# Patient Record
Sex: Male | Born: 1955 | Race: White | Hispanic: No | Smoking: Former smoker
Health system: Southern US, Community
[De-identification: ages and names within clinical notes are randomized; demographics above are authoritative.]

## PROBLEM LIST (undated history)

## (undated) DIAGNOSIS — E669 Obesity, unspecified: Secondary | ICD-10-CM

## (undated) DIAGNOSIS — R200 Anesthesia of skin: Secondary | ICD-10-CM

## (undated) DIAGNOSIS — J449 Chronic obstructive pulmonary disease, unspecified: Secondary | ICD-10-CM

## (undated) DIAGNOSIS — M1990 Unspecified osteoarthritis, unspecified site: Secondary | ICD-10-CM

## (undated) DIAGNOSIS — E119 Type 2 diabetes mellitus without complications: Secondary | ICD-10-CM

## (undated) DIAGNOSIS — I10 Essential (primary) hypertension: Secondary | ICD-10-CM

## (undated) DIAGNOSIS — D649 Anemia, unspecified: Secondary | ICD-10-CM

## (undated) DIAGNOSIS — D126 Benign neoplasm of colon, unspecified: Secondary | ICD-10-CM

## (undated) DIAGNOSIS — E559 Vitamin D deficiency, unspecified: Secondary | ICD-10-CM

## (undated) DIAGNOSIS — E291 Testicular hypofunction: Secondary | ICD-10-CM

## (undated) DIAGNOSIS — E781 Pure hyperglyceridemia: Secondary | ICD-10-CM

## (undated) DIAGNOSIS — E785 Hyperlipidemia, unspecified: Secondary | ICD-10-CM

## (undated) HISTORY — PX: COLONOSCOPY: SHX174

## (undated) HISTORY — PX: ORIF FINGER / THUMB FRACTURE: SUR932

---

## 2007-08-27 ENCOUNTER — Ambulatory Visit: Payer: Self-pay | Admitting: Unknown Physician Specialty

## 2009-02-15 ENCOUNTER — Ambulatory Visit: Payer: Self-pay | Admitting: Family Medicine

## 2011-01-08 ENCOUNTER — Ambulatory Visit: Payer: Self-pay | Admitting: Unknown Physician Specialty

## 2011-01-09 LAB — PATHOLOGY REPORT

## 2011-04-09 ENCOUNTER — Emergency Department: Payer: Self-pay | Admitting: Emergency Medicine

## 2011-04-13 ENCOUNTER — Ambulatory Visit: Payer: Self-pay | Admitting: Family Medicine

## 2011-06-16 ENCOUNTER — Inpatient Hospital Stay: Payer: Self-pay | Admitting: Specialist

## 2014-08-26 DIAGNOSIS — R600 Localized edema: Secondary | ICD-10-CM | POA: Insufficient documentation

## 2015-03-01 DIAGNOSIS — E559 Vitamin D deficiency, unspecified: Secondary | ICD-10-CM | POA: Insufficient documentation

## 2015-05-16 ENCOUNTER — Ambulatory Visit: Payer: 59 | Admitting: Anesthesiology

## 2015-05-16 ENCOUNTER — Encounter: Payer: Self-pay | Admitting: *Deleted

## 2015-05-16 ENCOUNTER — Ambulatory Visit
Admission: RE | Admit: 2015-05-16 | Discharge: 2015-05-16 | Disposition: A | Discharge status: Home / Self Care | Payer: 59 | Source: Ambulatory Visit | Attending: Unknown Physician Specialty | Admitting: Unknown Physician Specialty

## 2015-05-16 ENCOUNTER — Encounter
Admission: RE | Disposition: A | Discharge status: Home / Self Care | Payer: Self-pay | Source: Ambulatory Visit | Attending: Unknown Physician Specialty

## 2015-05-16 DIAGNOSIS — Z79899 Other long term (current) drug therapy: Secondary | ICD-10-CM | POA: Insufficient documentation

## 2015-05-16 DIAGNOSIS — Z87891 Personal history of nicotine dependence: Secondary | ICD-10-CM | POA: Insufficient documentation

## 2015-05-16 DIAGNOSIS — I10 Essential (primary) hypertension: Secondary | ICD-10-CM | POA: Insufficient documentation

## 2015-05-16 DIAGNOSIS — Z86010 Personal history of colonic polyps: Secondary | ICD-10-CM | POA: Diagnosis not present

## 2015-05-16 DIAGNOSIS — K5730 Diverticulosis of large intestine without perforation or abscess without bleeding: Secondary | ICD-10-CM | POA: Diagnosis not present

## 2015-05-16 DIAGNOSIS — Z792 Long term (current) use of antibiotics: Secondary | ICD-10-CM | POA: Diagnosis not present

## 2015-05-16 DIAGNOSIS — D649 Anemia, unspecified: Secondary | ICD-10-CM | POA: Insufficient documentation

## 2015-05-16 DIAGNOSIS — E119 Type 2 diabetes mellitus without complications: Secondary | ICD-10-CM | POA: Insufficient documentation

## 2015-05-16 DIAGNOSIS — Z1211 Encounter for screening for malignant neoplasm of colon: Secondary | ICD-10-CM | POA: Diagnosis present

## 2015-05-16 DIAGNOSIS — Z6841 Body Mass Index (BMI) 40.0 and over, adult: Secondary | ICD-10-CM | POA: Insufficient documentation

## 2015-05-16 DIAGNOSIS — J449 Chronic obstructive pulmonary disease, unspecified: Secondary | ICD-10-CM | POA: Diagnosis not present

## 2015-05-16 DIAGNOSIS — Z7982 Long term (current) use of aspirin: Secondary | ICD-10-CM | POA: Diagnosis not present

## 2015-05-16 DIAGNOSIS — K648 Other hemorrhoids: Secondary | ICD-10-CM | POA: Insufficient documentation

## 2015-05-16 DIAGNOSIS — K621 Rectal polyp: Secondary | ICD-10-CM | POA: Diagnosis not present

## 2015-05-16 DIAGNOSIS — E785 Hyperlipidemia, unspecified: Secondary | ICD-10-CM | POA: Diagnosis not present

## 2015-05-16 DIAGNOSIS — D123 Benign neoplasm of transverse colon: Secondary | ICD-10-CM | POA: Diagnosis not present

## 2015-05-16 DIAGNOSIS — E781 Pure hyperglyceridemia: Secondary | ICD-10-CM | POA: Diagnosis not present

## 2015-05-16 HISTORY — DX: Anemia, unspecified: D64.9

## 2015-05-16 HISTORY — DX: Type 2 diabetes mellitus without complications: E11.9

## 2015-05-16 HISTORY — DX: Hyperlipidemia, unspecified: E78.5

## 2015-05-16 HISTORY — DX: Benign neoplasm of colon, unspecified: D12.6

## 2015-05-16 HISTORY — DX: Testicular hypofunction: E29.1

## 2015-05-16 HISTORY — DX: Vitamin D deficiency, unspecified: E55.9

## 2015-05-16 HISTORY — PX: COLONOSCOPY WITH PROPOFOL: SHX5780

## 2015-05-16 HISTORY — DX: Obesity, unspecified: E66.9

## 2015-05-16 HISTORY — DX: Pure hyperglyceridemia: E78.1

## 2015-05-16 HISTORY — DX: Essential (primary) hypertension: I10

## 2015-05-16 LAB — GLUCOSE, CAPILLARY: Glucose-Capillary: 283 mg/dL — ABNORMAL HIGH (ref 65–99)

## 2015-05-16 SURGERY — COLONOSCOPY WITH PROPOFOL
Anesthesia: General

## 2015-05-16 MED ORDER — SODIUM CHLORIDE 0.9 % IV SOLN: INTRAVENOUS | Status: DC

## 2015-05-16 MED ORDER — PROPOFOL INFUSION 10 MG/ML OPTIME
INTRAVENOUS | Status: DC | PRN
  Administered 2015-05-16: 140 ug/kg/min via INTRAVENOUS

## 2015-05-16 MED ORDER — ATENOLOL 25 MG PO TABS
50.0000 mg | ORAL_TABLET | Freq: Once | ORAL | Status: AC
  Administered 2015-05-16: 50 mg via ORAL

## 2015-05-16 MED ORDER — ATENOLOL 25 MG PO TABS
ORAL_TABLET | ORAL | Status: AC
Start: 2015-05-16 — End: 2015-05-16
  Administered 2015-05-16: 50 mg via ORAL
  Filled 2015-05-16: qty 2

## 2015-05-16 MED ORDER — SODIUM CHLORIDE 0.9 % IV SOLN
INTRAVENOUS | Status: DC
  Administered 2015-05-16: 10:00:00 via INTRAVENOUS

## 2015-05-16 MED ORDER — FENTANYL CITRATE (PF) 100 MCG/2ML IJ SOLN
INTRAMUSCULAR | Status: DC | PRN
  Administered 2015-05-16: 50 ug via INTRAVENOUS

## 2015-05-16 MED ORDER — MIDAZOLAM HCL 2 MG/2ML IJ SOLN
INTRAMUSCULAR | Status: DC | PRN
  Administered 2015-05-16: 1 mg via INTRAVENOUS

## 2015-05-16 NOTE — Op Note (Signed)
[facility] Patient Name: Andrew Dominguez Procedure Date: 05/16/2015 9:52 AM MRN: [id number] Account #: [id number] Date of Birth: 1956-04-10 Admit Type: Outpatient Age: 59 Room: [facility] ENDO ROOM 1 Gender: Male Note Status: Finalized Procedure:         Colonoscopy Indications:       Follow-up for history of adenomatous polyps in the colon Providers:         Manya Silvas, MD Referring MD:      Youlanda Roys. Ola Spurr, MD (Referring MD) Medicines:         Propofol per Anesthesia Complications:     No immediate complications. Procedure:         Pre-Anesthesia Assessment:                    - After reviewing the risks and benefits, the patient was                     deemed in satisfactory condition to undergo the procedure.                    After obtaining informed consent, the colonoscope was                     passed under direct vision. Throughout the procedure, the                     patient's blood pressure, pulse, and oxygen saturations                     were monitored continuously. The Colonoscope was                     introduced through the anus and advanced to the the cecum,                     identified by its appearance. The colonoscopy was                     performed without difficulty. The patient tolerated the                     procedure well. The quality of the bowel preparation was                     good. Findings:      Could see part of the cecum but due to body habitus could not place       scope in cecum.      A few small-mouthed diverticula were found in the sigmoid colon.      Internal hemorrhoids were found during endoscopy. The hemorrhoids were       small and Grade I (internal hemorrhoids that do not prolapse).      Two sessile polyps were found in the sigmoid colon and at the hepatic       flexure. The polyps were small in size. These polyps were removed with a       hot snare. Resection and retrieval were  complete.      Two sessile polyps were found in the rectum. The polyps were diminutive       in size. These polyps were removed with a jumbo cold forceps. Resection       and retrieval were complete.      The exam was otherwise without abnormality.  Impression:        - Diverticulosis in the sigmoid colon.                    - Internal hemorrhoids.                    - Two small polyps in the sigmoid colon and at the hepatic                     flexure. Resected and retrieved.                    - Two diminutive polyps in the rectum. Resected and                     retrieved.                    - The examination was otherwise normal. Recommendation:    - Await pathology results.                    - The findings and recommendations were discussed with the                     patient's family. Manya Silvas, MD 05/16/2015 10:23:19 AM This report has been signed electronically. Number of Addenda: 0 Note Initiated On: 05/16/2015 9:52 AM Scope Withdrawal Time: 0 hours 9 minutes 36 seconds  Total Procedure Duration: 0 hours 19 minutes 35 seconds       [facility]

## 2015-05-16 NOTE — H&P (Signed)
Primary Care Physician:  Adrian Prows, MD Primary Gastroenterologist:  Dr. Vira Agar  Pre-Procedure History & Physical: HPI:  Andrew Dominguez is a 59 y.o. male is here for an colonoscopy.   Past Medical History  Diagnosis Date  . Anemia   . Hypertension   . Hyperlipemia   . Obesity   . Vitamin D deficiency   . Hypertriglyceridemia   . Hypogonadism in male   . Tubular adenoma of colon   . Diabetes mellitus without complication     Past Surgical History  Procedure Laterality Date  . Colonoscopy      Prior to Admission medications   Medication Sig Start Date End Date Taking? Authorizing Provider  amLODipine (NORVASC) 10 MG tablet Take 10 mg by mouth daily.   Yes Historical Provider, MD  aspirin EC 81 MG tablet Take 81 mg by mouth daily.   Yes Historical Provider, MD  atenolol (TENORMIN) 50 MG tablet Take 50 mg by mouth daily.   Yes Historical Provider, MD  cephALEXin (KEFLEX) 500 MG capsule Take 500 mg by mouth 4 (four) times daily.   Yes Historical Provider, MD  hydrochlorothiazide (HYDRODIURIL) 25 MG tablet Take 25 mg by mouth daily.   Yes Historical Provider, MD  losartan (COZAAR) 100 MG tablet Take 100 mg by mouth daily.   Yes Historical Provider, MD  metFORMIN (GLUCOPHAGE) 1000 MG tablet Take 1,000 mg by mouth 2 (two) times daily with a meal.   Yes Historical Provider, MD  niacin-lovastatin (ADVICOR) 500-20 MG 24 hr tablet Take 1 tablet by mouth at bedtime.   Yes Historical Provider, MD    Allergies as of 03/31/2015  . (Not on File)    History reviewed. No pertinent family history.  History   Social History  . Marital Status: Married    Spouse Name: N/A  . Number of Children: N/A  . Years of Education: N/A   Occupational History  . Not on file.   Social History Main Topics  . Smoking status: Former Research scientist (life sciences)  . Smokeless tobacco: Never Used  . Alcohol Use: 8.4 oz/week    14 Shots of liquor per week  . Drug Use: No  . Sexual Activity: Not on file    Other Topics Concern  . Not on file   Social History Narrative    Review of Systems: See HPI, otherwise negative ROS  Physical Exam: BP 143/84 mmHg  Pulse 90  Temp(Src) 97.4 F (36.3 C) (Tympanic)  Resp 16  Ht 6' (1.829 m)  Wt 151.955 kg (335 lb)  BMI 45.42 kg/m2  SpO2 97% General:   Alert,  pleasant and cooperative in NAD Head:  Normocephalic and atraumatic. Neck:  Supple; no masses or thyromegaly. Lungs:  Clear throughout to auscultation.    Heart:  Regular rate and rhythm. Abdomen:  Soft, nontender and nondistended. Normal bowel sounds, without guarding, and without rebound.   Neurologic:  Alert and  oriented x4;  grossly normal neurologically.  Impression/Plan: Andrew Dominguez is here for an colonoscopy to be performed for personal history of colon polyps  Risks, benefits, limitations, and alternatives regarding  colonoscopy have been reviewed with the patient.  Questions have been answered.  All parties agreeable.   Gaylyn Cheers, MD  05/16/2015, 9:47 AM   Primary Care Physician:  Adrian Prows, MD Primary Gastroenterologist:  Dr. Vira Agar  Pre-Procedure History & Physical: HPI:  Andrew Dominguez is a 59 y.o. male is here for an colonoscopy.   Past Medical History  Diagnosis Date  . Anemia   . Hypertension   . Hyperlipemia   . Obesity   . Vitamin D deficiency   . Hypertriglyceridemia   . Hypogonadism in male   . Tubular adenoma of colon   . Diabetes mellitus without complication     Past Surgical History  Procedure Laterality Date  . Colonoscopy      Prior to Admission medications   Medication Sig Start Date End Date Taking? Authorizing Provider  amLODipine (NORVASC) 10 MG tablet Take 10 mg by mouth daily.   Yes Historical Provider, MD  aspirin EC 81 MG tablet Take 81 mg by mouth daily.   Yes Historical Provider, MD  atenolol (TENORMIN) 50 MG tablet Take 50 mg by mouth daily.   Yes Historical Provider, MD  cephALEXin (KEFLEX) 500 MG capsule  Take 500 mg by mouth 4 (four) times daily.   Yes Historical Provider, MD  hydrochlorothiazide (HYDRODIURIL) 25 MG tablet Take 25 mg by mouth daily.   Yes Historical Provider, MD  losartan (COZAAR) 100 MG tablet Take 100 mg by mouth daily.   Yes Historical Provider, MD  metFORMIN (GLUCOPHAGE) 1000 MG tablet Take 1,000 mg by mouth 2 (two) times daily with a meal.   Yes Historical Provider, MD  niacin-lovastatin (ADVICOR) 500-20 MG 24 hr tablet Take 1 tablet by mouth at bedtime.   Yes Historical Provider, MD    Allergies as of 03/31/2015  . (Not on File)    History reviewed. No pertinent family history.  History   Social History  . Marital Status: Married    Spouse Name: N/A  . Number of Children: N/A  . Years of Education: N/A   Occupational History  . Not on file.   Social History Main Topics  . Smoking status: Former Research scientist (life sciences)  . Smokeless tobacco: Never Used  . Alcohol Use: 8.4 oz/week    14 Shots of liquor per week  . Drug Use: No  . Sexual Activity: Not on file   Other Topics Concern  . Not on file   Social History Narrative    Review of Systems: See HPI, otherwise negative ROS  Physical Exam: BP 143/84 mmHg  Pulse 90  Temp(Src) 97.4 F (36.3 C) (Tympanic)  Resp 16  Ht 6' (1.829 m)  Wt 151.955 kg (335 lb)  BMI 45.42 kg/m2  SpO2 97% General:   Alert,  pleasant and cooperative in NAD Head:  Normocephalic and atraumatic. Neck:  Supple; no masses or thyromegaly. Lungs:  Clear throughout to auscultation.    Heart:  Regular rate and rhythm. Abdomen:  Soft, nontender and nondistended. Normal bowel sounds, without guarding, and without rebound.  Very massive abdomen and obese Neurologic:  Alert and  oriented x4;  grossly normal neurologically.  Impression/Plan: Andrew Dominguez is here for an colonoscopy to be performed for personal history of colon polyps  Risks, benefits, limitations, and alternatives regarding  colonoscopy have been reviewed with the patient.   Questions have been answered.  All parties agreeable.   Gaylyn Cheers, MD  05/16/2015, 9:47 AM

## 2015-05-16 NOTE — Transfer of Care (Signed)
Immediate Anesthesia Transfer of Care Note  Patient: JAQUAWN SAFFRAN  Procedure(s) Performed: Procedure(s): COLONOSCOPY WITH PROPOFOL (N/A)  Patient Location: PACU  Anesthesia Type:General  Level of Consciousness: awake, alert  and oriented  Airway & Oxygen Therapy: Patient Spontanous Breathing and Patient connected to nasal cannula oxygen  Post-op Assessment: Report given to RN and Post -op Vital signs reviewed and stable  Post vital signs: Reviewed and stable  Last Vitals:  Filed Vitals:   05/16/15 0914  BP: 143/84  Pulse: 90  Temp: 36.3 C  Resp: 16    Complications: No apparent anesthesia complications

## 2015-05-16 NOTE — Anesthesia Procedure Notes (Signed)
Performed by: COOK-MARTIN, Charelle Petrakis Pre-anesthesia Checklist: Patient identified, Emergency Drugs available, Suction available, Patient being monitored and Timeout performed Patient Re-evaluated:Patient Re-evaluated prior to inductionOxygen Delivery Method: Simple face mask Preoxygenation: Pre-oxygenation with 100% oxygen Intubation Type: IV induction Placement Confirmation: positive ETCO2 and CO2 detector       

## 2015-05-16 NOTE — Anesthesia Preprocedure Evaluation (Addendum)
Anesthesia Evaluation  Patient identified by MRN, date of birth, ID band Patient awake    Reviewed: Allergy & Precautions, NPO status , Patient's Chart, lab work & pertinent test results  Airway Mallampati: III       Dental  (+) Poor Dentition, Chipped   Pulmonary COPDformer smoker,    + decreased breath sounds      Cardiovascular hypertension, Pt. on medications and Pt. on home beta blockers Normal cardiovascular exam    Neuro/Psych negative neurological ROS  negative psych ROS   GI/Hepatic negative GI ROS, Neg liver ROS,   Endo/Other  diabetes, Poorly Controlled, Type 2, Oral Hypoglycemic AgentsMorbid obesity  Renal/GU   negative genitourinary   Musculoskeletal   Abdominal (+) + obese,   Peds negative pediatric ROS (+)  Hematology  (+) anemia ,   Anesthesia Other Findings   Reproductive/Obstetrics                            Anesthesia Physical Anesthesia Plan  ASA: III  Anesthesia Plan: General   Post-op Pain Management:    Induction: Intravenous  Airway Management Planned: Natural Airway  Additional Equipment:   Intra-op Plan:   Post-operative Plan:   Informed Consent:   Plan Discussed with: CRNA  Anesthesia Plan Comments:         Anesthesia Quick Evaluation

## 2015-05-16 NOTE — Anesthesia Postprocedure Evaluation (Signed)
  Anesthesia Post-op Note  Patient: Andrew Dominguez  Procedure(s) Performed: Procedure(s): COLONOSCOPY WITH PROPOFOL (N/A)  Anesthesia type:General  Patient location: PACU  Post pain: Pain level controlled  Post assessment: Post-op Vital signs reviewed, Patient's Cardiovascular Status Stable, Respiratory Function Stable, Patent Airway and No signs of Nausea or vomiting  Post vital signs: Reviewed and stable  Last Vitals:  Filed Vitals:   05/16/15 0914  BP: 143/84  Pulse: 90  Temp: 36.3 C  Resp: 16    Level of consciousness: awake, alert  and patient cooperative  Complications: No apparent anesthesia complications

## 2015-05-16 NOTE — Anesthesia Postprocedure Evaluation (Signed)
  Anesthesia Post-op Note  Patient: Andrew Dominguez  Procedure(s) Performed: Procedure(s): COLONOSCOPY WITH PROPOFOL (N/A)  Anesthesia type:General  Patient location: PACU  Post pain: Pain level controlled  Post assessment: Post-op Vital signs reviewed, Patient's Cardiovascular Status Stable, Respiratory Function Stable, Patent Airway and No signs of Nausea or vomiting  Post vital signs: Reviewed and stable  Last Vitals:  Filed Vitals:   05/16/15 1030  BP: 122/66  Pulse: 65  Temp:   Resp: 13    Level of consciousness: awake, alert  and patient cooperative  Complications: No apparent anesthesia complications

## 2015-05-17 ENCOUNTER — Encounter: Payer: Self-pay | Admitting: Unknown Physician Specialty

## 2015-05-17 LAB — SURGICAL PATHOLOGY

## 2015-07-21 ENCOUNTER — Emergency Department
Admission: EM | Admit: 2015-07-21 | Discharge: 2015-07-21 | Disposition: A | Discharge status: Home / Self Care | Payer: 59 | Attending: Emergency Medicine | Admitting: Emergency Medicine

## 2015-07-21 ENCOUNTER — Emergency Department: Payer: 59

## 2015-07-21 DIAGNOSIS — Z87891 Personal history of nicotine dependence: Secondary | ICD-10-CM | POA: Insufficient documentation

## 2015-07-21 DIAGNOSIS — Z79899 Other long term (current) drug therapy: Secondary | ICD-10-CM | POA: Diagnosis not present

## 2015-07-21 DIAGNOSIS — Z792 Long term (current) use of antibiotics: Secondary | ICD-10-CM | POA: Diagnosis not present

## 2015-07-21 DIAGNOSIS — Z7982 Long term (current) use of aspirin: Secondary | ICD-10-CM | POA: Diagnosis not present

## 2015-07-21 DIAGNOSIS — D649 Anemia, unspecified: Secondary | ICD-10-CM | POA: Insufficient documentation

## 2015-07-21 DIAGNOSIS — M65272 Calcific tendinitis, left ankle and foot: Secondary | ICD-10-CM | POA: Insufficient documentation

## 2015-07-21 DIAGNOSIS — I10 Essential (primary) hypertension: Secondary | ICD-10-CM | POA: Insufficient documentation

## 2015-07-21 DIAGNOSIS — E785 Hyperlipidemia, unspecified: Secondary | ICD-10-CM | POA: Insufficient documentation

## 2015-07-21 DIAGNOSIS — E119 Type 2 diabetes mellitus without complications: Secondary | ICD-10-CM | POA: Insufficient documentation

## 2015-07-21 DIAGNOSIS — M79672 Pain in left foot: Secondary | ICD-10-CM | POA: Diagnosis present

## 2015-07-21 MED ORDER — NAPROXEN 500 MG PO TABS
500.0000 mg | ORAL_TABLET | Freq: Once | ORAL | Status: AC
  Administered 2015-07-21: 500 mg via ORAL
  Filled 2015-07-21: qty 1

## 2015-07-21 MED ORDER — TRAMADOL HCL 50 MG PO TABS: 50.0000 mg | ORAL_TABLET | Freq: Four times a day (QID) | ORAL | Status: DC | PRN

## 2015-07-21 MED ORDER — TRAMADOL HCL 50 MG PO TABS
50.0000 mg | ORAL_TABLET | Freq: Once | ORAL | Status: AC
  Administered 2015-07-21: 50 mg via ORAL
  Filled 2015-07-21: qty 1

## 2015-07-21 MED ORDER — NAPROXEN 500 MG PO TABS: 500.0000 mg | ORAL_TABLET | Freq: Two times a day (BID) | ORAL | Status: DC

## 2015-07-21 NOTE — ED Notes (Signed)
Pt to ED c/o pain to bottom of L foot, pt reports hearing a "pop" as pain began. Pt denies any increase from normal in swelling and denies skin breakdown.

## 2015-07-21 NOTE — ED Provider Notes (Signed)
[facility] Emergency Department Provider Note  ____________________________________________  Time seen: Approximately 9:03 PM  I have reviewed the triage vital signs and the nursing notes.   HISTORY  Chief Complaint Foot Pain    HPI Andrew Dominguez is a 59 y.o. male patient complaining of pain to the bottom of his foot patient's report. A popping sound on the onset of pain. Patient state he has not noticed any swelling or redness to the foot. Patient stated pain is worse when he's walking barefooted. Patient stated nonweightbearing has no pain. When ambulating pain as approximately a 5/10. No palliative measures taken for this complaint.   Past Medical History  Diagnosis Date  . Anemia   . Hypertension   . Hyperlipemia   . Obesity   . Vitamin D deficiency   . Hypertriglyceridemia   . Hypogonadism in male   . Tubular adenoma of colon   . Diabetes mellitus without complication     There are no active problems to display for this patient.   Past Surgical History  Procedure Laterality Date  . Colonoscopy    . Colonoscopy with propofol N/A 05/16/2015    Procedure: COLONOSCOPY WITH PROPOFOL;  Surgeon: Manya Silvas, MD;  Location: [facility] ENDOSCOPY;  Service: Endoscopy;  Laterality: N/A;    Current Outpatient Rx  Name  Route  Sig  Dispense  Refill  . amLODipine (NORVASC) 10 MG tablet   Oral   Take 10 mg by mouth daily.         Marland Kitchen aspirin EC 81 MG tablet   Oral   Take 81 mg by mouth daily.         Marland Kitchen atenolol (TENORMIN) 50 MG tablet   Oral   Take 50 mg by mouth daily.         . cephALEXin (KEFLEX) 500 MG capsule   Oral   Take 500 mg by mouth 4 (four) times daily.         . hydrochlorothiazide (HYDRODIURIL) 25 MG tablet   Oral   Take 25 mg by mouth daily.         Marland Kitchen losartan (COZAAR) 100 MG tablet   Oral   Take 100 mg by mouth daily.         . metFORMIN (GLUCOPHAGE) 1000 MG tablet   Oral   Take 1,000 mg by mouth 2 (two)  times daily with a meal.         . naproxen (NAPROSYN) 500 MG tablet   Oral   Take 1 tablet (500 mg total) by mouth 2 (two) times daily with a meal.   20 tablet   00   . niacin-lovastatin (ADVICOR) 500-20 MG 24 hr tablet   Oral   Take 1 tablet by mouth at bedtime.         . traMADol (ULTRAM) 50 MG tablet   Oral   Take 1 tablet (50 mg total) by mouth every 6 (six) hours as needed for moderate pain.   12 tablet   0     Allergies Sulfa antibiotics  No family history on file.  Social History Social History  Substance Use Topics  . Smoking status: Former Research scientist (life sciences)  . Smokeless tobacco: Never Used  . Alcohol Use: 8.4 oz/week    14 Shots of liquor per week    Review of Systems Constitutional: No fever/chills Eyes: No visual changes. ENT: No sore throat. Cardiovascular: Denies chest pain. Respiratory: Denies shortness of breath. Gastrointestinal: No abdominal pain.  No nausea, no vomiting.  No diarrhea.  No constipation. Genitourinary: Negative for dysuria. Musculoskeletal: Negative for back pain. Skin: Negative for rash. Neurological: Negative for headaches, focal weakness or numbness. Endocrine:Hypertension hyperlipidemia and diabetes. Hematological/Lymphatic:Anemia Allergic/Immunilogical: Sulfa medications ____________________________________________   PHYSICAL EXAM:  VITAL SIGNS: ED Triage Vitals  Enc Vitals Group     BP 07/21/15 1949 191/85 mmHg     Pulse Rate 07/21/15 1949 64     Resp 07/21/15 1949 20     Temp 07/21/15 1948 98.1 F (36.7 C)     Temp Source 07/21/15 1948 Oral     SpO2 07/21/15 1949 97 %     Weight 07/21/15 1949 318 lb (144.244 kg)     Height 07/21/15 1949 6' (1.829 m)     Head Cir --      Peak Flow --      Pain Score 07/21/15 1949 0     Pain Loc --      Pain Edu? --      Excl. in [place]? --     Constitutional: Alert and oriented. Well appearing and in no acute distress. Morbid obesity Eyes: Conjunctivae are normal. PERRL.  EOMI. Head: Atraumatic. Nose: No congestion/rhinnorhea. Mouth/Throat: Mucous membranes are moist.  Oropharynx non-erythematous. Neck: No stridor.   Hematological/Lymphatic/Immunilogical: No cervical lymphadenopathy. Cardiovascular: Normal rate, regular rhythm. Grossly normal heart sounds.  Good peripheral circulation. Elevated blood pressure Respiratory: Normal respiratory effort.  No retractions. Lungs CTAB. Gastrointestinal: Soft and nontender. No distention. No abdominal bruits. No CVA tenderness. Musculoskeletal: Patient has a very high arch. There is no obvious edema or erythema. Patient is tender palpation at the posterior heel also the plantar aspect of the heel. Neurologic:  Normal speech and language. No gross focal neurologic deficits are appreciated. No gait instability. Skin:  Skin is warm, dry and intact. No rash noted. Psychiatric: Mood and affect are normal. Speech and behavior are normal.  ____________________________________________   LABS (all labs ordered are listed, but only abnormal results are displayed)  Labs Reviewed - No data to display ____________________________________________  EKG   ____________________________________________  RADIOLOGY  No bony pathology patient has no Achilles spurs and a small calcaneus spur. I, Sable Feil, personally viewed and evaluated these images (plain radiographs) as part of my medical decision making.   ____________________________________________   PROCEDURES  Procedure(s) performed: None  Critical Care performed: No  ____________________________________________   INITIAL IMPRESSION / ASSESSMENT AND PLAN / ED COURSE  Pertinent labs & imaging results that were available during my care of the patient were reviewed by me and considered in my medical decision making (see chart for details).  Tendinitis left foot. Discuss x-ray findings with patient and either follow-up with podiatry. Patient given a  prescription for tramadol and naproxen. ____________________________________________   FINAL CLINICAL IMPRESSION(S) / ED DIAGNOSES  Final diagnoses:  Calcific tendonitis of foot, left      RAIFE LIZER, PA-C 07/21/15 2120  Nena Polio, MD 07/22/15 [phone]

## 2016-11-06 DIAGNOSIS — E119 Type 2 diabetes mellitus without complications: Secondary | ICD-10-CM | POA: Diagnosis not present

## 2016-11-06 DIAGNOSIS — E782 Mixed hyperlipidemia: Secondary | ICD-10-CM | POA: Diagnosis not present

## 2016-11-06 DIAGNOSIS — I10 Essential (primary) hypertension: Secondary | ICD-10-CM | POA: Diagnosis not present

## 2016-11-13 DIAGNOSIS — Z0000 Encounter for general adult medical examination without abnormal findings: Secondary | ICD-10-CM | POA: Diagnosis not present

## 2016-11-13 DIAGNOSIS — E1129 Type 2 diabetes mellitus with other diabetic kidney complication: Secondary | ICD-10-CM | POA: Diagnosis not present

## 2016-11-13 DIAGNOSIS — Z1159 Encounter for screening for other viral diseases: Secondary | ICD-10-CM | POA: Diagnosis not present

## 2016-12-11 DIAGNOSIS — E781 Pure hyperglyceridemia: Secondary | ICD-10-CM | POA: Diagnosis not present

## 2016-12-11 DIAGNOSIS — E538 Deficiency of other specified B group vitamins: Secondary | ICD-10-CM | POA: Diagnosis not present

## 2016-12-11 DIAGNOSIS — E1165 Type 2 diabetes mellitus with hyperglycemia: Secondary | ICD-10-CM | POA: Diagnosis not present

## 2016-12-18 DIAGNOSIS — E781 Pure hyperglyceridemia: Secondary | ICD-10-CM | POA: Diagnosis not present

## 2016-12-18 DIAGNOSIS — E538 Deficiency of other specified B group vitamins: Secondary | ICD-10-CM | POA: Diagnosis not present

## 2016-12-18 DIAGNOSIS — E1129 Type 2 diabetes mellitus with other diabetic kidney complication: Secondary | ICD-10-CM | POA: Diagnosis not present

## 2016-12-24 DIAGNOSIS — N492 Inflammatory disorders of scrotum: Secondary | ICD-10-CM | POA: Diagnosis not present

## 2016-12-28 DIAGNOSIS — N492 Inflammatory disorders of scrotum: Secondary | ICD-10-CM | POA: Diagnosis not present

## 2017-01-01 ENCOUNTER — Ambulatory Visit: Payer: Self-pay | Admitting: Urology

## 2017-01-01 ENCOUNTER — Encounter: Payer: Self-pay | Admitting: Urology

## 2017-01-01 VITALS — BP 155/90 | HR 73 | Ht 72.0 in | Wt 324.0 lb

## 2017-01-01 DIAGNOSIS — E785 Hyperlipidemia, unspecified: Secondary | ICD-10-CM | POA: Insufficient documentation

## 2017-01-01 DIAGNOSIS — N4889 Other specified disorders of penis: Secondary | ICD-10-CM

## 2017-01-01 DIAGNOSIS — N492 Inflammatory disorders of scrotum: Secondary | ICD-10-CM

## 2017-01-01 DIAGNOSIS — E669 Obesity, unspecified: Secondary | ICD-10-CM | POA: Insufficient documentation

## 2017-01-01 DIAGNOSIS — E781 Pure hyperglyceridemia: Secondary | ICD-10-CM | POA: Insufficient documentation

## 2017-01-01 DIAGNOSIS — I10 Essential (primary) hypertension: Secondary | ICD-10-CM | POA: Insufficient documentation

## 2017-01-01 DIAGNOSIS — E119 Type 2 diabetes mellitus without complications: Secondary | ICD-10-CM | POA: Insufficient documentation

## 2017-01-01 MED ORDER — AMOXICILLIN-POT CLAVULANATE 875-125 MG PO TABS: 1.0000 | ORAL_TABLET | Freq: Two times a day (BID) | ORAL | 0 refills | Status: DC

## 2017-01-01 NOTE — Progress Notes (Signed)
01/01/2017 12:11 PM   [name] 1956/07/14 [id number]  Referring provider: Ebbie Ridge, MD [place], [place] [address]  Chief Complaint  Patient presents with  . Testicle Pain    New Patient    HPI: 61 year old male who presents today for further evaluation of a scrotal abscess. He first noted pain and drainage from his left hemiscrotum approximate 10 days ago. He was seen and evaluated by Dr. Ola Spurr at that time and started on 2 antibiotics, later switched to monotherapy Augmentin and after wound culture grew group B strep. He improved slightly but over the past 5 days, has developed worsening left scrotal discomfort and decreased drainage.  He completed his antibiotics several days ago.  He has been trying to express scrotal fluid himself by performing multiple maneuvers including applying significant pressure to his suprapubic area and left hemiscrotum. He noted bruising over his suprapubic area about 2 or 3 days ago after performing such a maneuver.  He denies any fevers, chills, nausea, or vomiting.  He does not have significant pain in the scrotal area.  He's had difficulty voiding over the past few days due to significant penile foreskin edema. He has needed to sit to void and is spraying his stream. He feels like he is able to empty his bladder well without urgency or frequency.  He is a moderately well-controlled diabetic, A1c 7.3.  He does have a personal history of abscesses on his legs requiring I&D but no scrotal abscesses.   PMH: Past Medical History:  Diagnosis Date  . Anemia   . Diabetes mellitus without complication ([place])   . Hyperlipemia   . Hypertension   . Hypertriglyceridemia   . Hypogonadism in male   . Obesity   . Tubular adenoma of colon   . Vitamin D deficiency     Surgical History: Past Surgical History:  Procedure Laterality Date  . COLONOSCOPY    . COLONOSCOPY WITH PROPOFOL N/A 05/16/2015   Procedure:  COLONOSCOPY WITH PROPOFOL;  Surgeon: Manya Silvas, MD;  Location: [facility] ENDOSCOPY;  Service: Endoscopy;  Laterality: N/A;    Home Medications:  Allergies as of 01/01/2017      Reactions   Sulfa Antibiotics Rash      Medication List       Accurate as of 01/01/17 12:11 PM. Always use your most recent med list.          amLODipine 10 MG tablet Commonly known as:  NORVASC Take 10 mg by mouth daily.   amoxicillin-clavulanate 875-125 MG tablet Commonly known as:  AUGMENTIN Take 1 tablet by mouth every 12 (twelve) hours.   aspirin EC 81 MG tablet Take 81 mg by mouth daily.   atenolol 50 MG tablet Commonly known as:  TENORMIN Take 50 mg by mouth daily.   gemfibrozil 600 MG tablet Commonly known as:  LOPID   hydrochlorothiazide 25 MG tablet Commonly known as:  HYDRODIURIL Take 25 mg by mouth daily.   JARDIANCE 10 MG Tabs tablet Generic drug:  empagliflozin   losartan 100 MG tablet Commonly known as:  COZAAR Take 100 mg by mouth daily.   lovastatin 10 MG tablet Commonly known as:  MEVACOR   metFORMIN 1000 MG tablet Commonly known as:  GLUCOPHAGE Take 1,000 mg by mouth 2 (two) times daily with a meal.   naproxen 500 MG tablet Commonly known as:  NAPROSYN Take 1 tablet (500 mg total) by mouth 2 (two) times daily with a  meal.   traMADol 50 MG tablet Commonly known as:  ULTRAM Take 1 tablet (50 mg total) by mouth every 6 (six) hours as needed for moderate pain.   VICTOZA 18 MG/3ML Sopn Generic drug:  liraglutide       Allergies:  Allergies  Allergen Reactions  . Sulfa Antibiotics Rash    Family History: Family History  Problem Relation Age of Onset  . Prostate cancer Neg Hx   . Bladder Cancer Neg Hx   . Kidney cancer Neg Hx     Social History:  reports that he has quit smoking. He has never used smokeless tobacco. He reports that he drinks about 8.4 oz of alcohol per week . He reports that he does not use drugs.  ROS: UROLOGY Frequent  Urination?: No Hard to postpone urination?: No Burning/pain with urination?: No Get up at night to urinate?: Yes Leakage of urine?: No Urine stream starts and stops?: No Trouble starting stream?: No Do you have to strain to urinate?: No Blood in urine?: No Urinary tract infection?: No Sexually transmitted disease?: No Injury to kidneys or bladder?: No Painful intercourse?: No Weak stream?: No Erection problems?: No Penile pain?: No  Gastrointestinal Nausea?: No Vomiting?: No Indigestion/heartburn?: No Diarrhea?: No Constipation?: No  Constitutional Fever: No Night sweats?: No Weight loss?: No Fatigue?: No  Skin Skin rash/lesions?: No Itching?: No  Eyes Blurred vision?: No Double vision?: No  Ears/Nose/Throat Sore throat?: No Sinus problems?: No  Hematologic/Lymphatic Swollen glands?: No Easy bruising?: Yes  Cardiovascular Leg swelling?: Yes Chest pain?: No  Respiratory Cough?: No Shortness of breath?: Yes  Endocrine Excessive thirst?: No  Musculoskeletal Back pain?: No Joint pain?: Yes  Neurological Headaches?: No Dizziness?: No  Psychologic Depression?: No Anxiety?: No  Physical Exam: BP (!) 155/90   Pulse 73   Ht 6' (1.829 m)   Wt (!) 324 lb (147 kg)   BMI 43.94 kg/m   Constitutional:  Alert and oriented, No acute distress. HEENT: [place] AT, moist mucus membranes.  Trachea midline, no masses. Cardiovascular: No clubbing, cyanosis, or edema. Respiratory: Normal respiratory effort, no increased work of breathing. GI: Abdomen is soft, nontender, nondistended, no abdominal masses.  Obese. GU: Ecchymosis overlying the suprapubic area without erythema and not extending above this region. Left hemiscrotal edema with an approximately 1 cm open area draining clear yellow fluid. There are some surrounding induration extending inferior and posteriorly within the left hemiscrotum but no fluctuance. Perineum is intact.  Perirectal region is  uninvolved.  Mild penile edema, slightly buried. Skin: No rashes, bruises or suspicious lesions. Neurologic: Grossly intact, no focal deficits, moving all 4 extremities. Psychiatric: Normal mood and affect.  Laboratory Data: Glucose 11/06/2016 147* 70 - 110 mg/dL Final  . Sodium 11/06/2016 138 136 - 145 mmol/L Final  . Potassium 11/06/2016 5.1 3.6 - 5.1 mmol/L Final  . Chloride 11/06/2016 102 97 - 109 mmol/L Final  . Carbon Dioxide (CO2) 11/06/2016 30.6 22.0 - 32.0 mmol/L Final  . Urea Nitrogen (BUN) 11/06/2016 19 7 - 25 mg/dL Final  . Creatinine 11/06/2016 0.9 0.7 - 1.3 mg/dL Final  . Glomerular Filtration Rate (eGFR),* 11/06/2016 86 >60 mL/min/1.73sq m Final  . Calcium 11/06/2016 9.7 8.7 - 10.3 mg/dL Final  . AST 11/06/2016 13 8 - 39 U/L Final  . ALT 11/06/2016 17 6 - 57 U/L Final  . Alk Phos (alkaline Phosphatase) 11/06/2016 47 34 - 104 U/L Final  . Albumin 11/06/2016 4.3 3.5 - 4.8 g/dL Final  . Bilirubin,  Total 11/06/2016 0.5 0.3 - 1.2 mg/dL Final  . Protein, Total 11/06/2016 6.8 6.1 - 7.9 g/dL Final  . A/G Ratio 11/06/2016 1.7 1.0 - 5.0 gm/dL Final  Appointment on 09/07/2016  Component Date Value Ref Range Status  . Hemoglobin A1C 09/07/2016 7.3* 4.2 - 5.6 % Final  . Average Blood Glucose (Calc) 09/07/2016 163 mg/dL Final  . Cholesterol, Total 09/07/2016 155 100 - 200 mg/dL Final  . Triglyceride 09/07/2016 334* 35 - 199 mg/dL Final  . HDL (High Density Lipoprotein) Cho* 09/07/2016 35.0 29.0 - 71.0 mg/dL Final  . LDL (Low Density Lipoprotien), Cal* 09/07/2016 53 0 - 130 mg/dL Final  . VLDL Cholesterol 09/07/2016 67 mg/dL Final  . Cholesterol/HDL Ratio 09/07/2016 4.4 Final  . Creatinine, Random Urine 09/07/2016 125.1 mg/dL Final  . Urine Albumin, Random 09/07/2016 200 mg/L Final  . Urine Albumin/Creatinine Ratio 09/07/2016 159.9* <30.0 ug/mg Final  . Glucose 09/07/2016 162* 70 - 110 mg/dL Final  . Sodium 09/07/2016 139 136 - 145 mmol/L Final  . Potassium 09/07/2016 4.7 3.6 -  5.1 mmol/L Final  . Chloride 09/07/2016 101 97 - 109 mmol/L Final  . Carbon Dioxide (CO2) 09/07/2016 30.3 22.0 - 32.0 mmol/L Final  . Calcium 09/07/2016 9.3 8.7 - 10.3 mg/dL Final  . Urea Nitrogen (BUN) 09/07/2016 14 7 - 25 mg/dL Final  . Creatinine 09/07/2016 0.9 0.7 - 1.3 mg/dL Final  . Glomerular Filtration Rate (eGFR),* 09/07/2016 86 >60 mL/min/1.73sq m Final  . BUN/Crea Ratio 09/07/2016 15.6 6.0 - 20.0 Final  . Anion Gap w/K 09/07/2016 12.4 6.0 - 16.0 Final   Pertinent Imaging: N/a  Procedure: Consent for I&D of the scrotum was obtained. Patient was examined both by myself and Dr. Louis Meckel for second opinion.  Reactive drainage of the left hemiscrotum was first probed using a sterile Q-tip. There was a capacious cavity tracking inferiorly. Maneuvers to breakup the loculations were performed. There is no significant purulence appreciated, only blood and some serous fluid. Due to the fairly large size of the cavity, the decision was made to extend the opening and pack the wound for that her wound healing. 5 cc of lidocaine was instilled into the overlying skin. An 11 blade knife was used to extend the opening. The wound was then packed with 1 inch packing tape. An AVD pad, 4 x 4's applied.  Assessment & Plan:  61 year old male, diabetic, with draining left scrotal abscess. On exam, there is significant induration, edema and some ecchymosis presumably from trauma. There is no evidence of necrotizing fasciitis or persistent purulent loculations within the scrotum.  Incision was extended and the scrotum was packed for the present wound healing.  1. Scrotal abscess Additional 7 day course of Augmentin given Wound check in 2 days Warning signs for worsening abscess/infection including signs and symptoms of 4 knees were reviewed in detail He understands when to seek urgent medical attention if things fail to improve Supportive care for penoscrotal edema  2. Penile edema   Wound check in 2  days.   Hollice Espy, MD  [facility] [address], [place], [place] [address] [phone]

## 2017-01-03 ENCOUNTER — Ambulatory Visit (INDEPENDENT_AMBULATORY_CARE_PROVIDER_SITE_OTHER): Payer: 59 | Admitting: Urology

## 2017-01-03 VITALS — BP 117/71 | HR 75 | Ht 72.0 in | Wt 328.0 lb

## 2017-01-03 DIAGNOSIS — N4889 Other specified disorders of penis: Secondary | ICD-10-CM

## 2017-01-03 DIAGNOSIS — N492 Inflammatory disorders of scrotum: Secondary | ICD-10-CM

## 2017-01-03 NOTE — Progress Notes (Signed)
01/03/2017 3:45 PM   Andrew Dominguez 02-19-1956 [id number]  Referring provider: Ebbie Ridge, MD [facility] [place], [place] [address]  Chief Complaint  Patient presents with  . Wound Check    scrotal abcess    HPI: 61 year old male who returns today for scrotal wound check. He underwent scrotal I&D in the office 2 days ago and was placed back on Augmentin. He continues to have persistent penoscrotal edema but no purulent drainage and no erythema. He has been using scrotal support. Overall, he thinks that he is at least stable if not mildly improved.  He continues to deny any fevers or chills. He does have a spraying of his urinary stream the contrary to penile edema.  PMH: Past Medical History:  Diagnosis Date  . Anemia   . Diabetes mellitus without complication ([place])   . Hyperlipemia   . Hypertension   . Hypertriglyceridemia   . Hypogonadism in male   . Obesity   . Tubular adenoma of colon   . Vitamin D deficiency     Surgical History: Past Surgical History:  Procedure Laterality Date  . COLONOSCOPY    . COLONOSCOPY WITH PROPOFOL N/A 05/16/2015   Procedure: COLONOSCOPY WITH PROPOFOL;  Surgeon: Manya Silvas, MD;  Location: [facility] ENDOSCOPY;  Service: Endoscopy;  Laterality: N/A;    Home Medications:  Allergies as of 01/03/2017      Reactions   Sulfa Antibiotics Rash      Medication List       Accurate as of 01/03/17  3:45 PM. Always use your most recent med list.          amLODipine 10 MG tablet Commonly known as:  NORVASC Take 10 mg by mouth daily.   amoxicillin-clavulanate 875-125 MG tablet Commonly known as:  AUGMENTIN Take 1 tablet by mouth every 12 (twelve) hours.   aspirin EC 81 MG tablet Take 81 mg by mouth daily.   atenolol 50 MG tablet Commonly known as:  TENORMIN Take 50 mg by mouth daily.   gemfibrozil 600 MG tablet Commonly known as:  LOPID   hydrochlorothiazide 25 MG tablet Commonly known as:  HYDRODIURIL Take 25  mg by mouth daily.   JARDIANCE 10 MG Tabs tablet Generic drug:  empagliflozin   losartan 100 MG tablet Commonly known as:  COZAAR Take 100 mg by mouth daily.   lovastatin 10 MG tablet Commonly known as:  MEVACOR   metFORMIN 1000 MG tablet Commonly known as:  GLUCOPHAGE Take 1,000 mg by mouth 2 (two) times daily with a meal.   naproxen 500 MG tablet Commonly known as:  NAPROSYN Take 1 tablet (500 mg total) by mouth 2 (two) times daily with a meal.   traMADol 50 MG tablet Commonly known as:  ULTRAM Take 1 tablet (50 mg total) by mouth every 6 (six) hours as needed for moderate pain.   VICTOZA 18 MG/3ML Sopn Generic drug:  liraglutide       Allergies:  Allergies  Allergen Reactions  . Sulfa Antibiotics Rash    Family History: Family History  Problem Relation Age of Onset  . Prostate cancer Neg Hx   . Bladder Cancer Neg Hx   . Kidney cancer Neg Hx     Social History:  reports that he has quit smoking. He has never used smokeless tobacco. He reports that he drinks about 8.4 oz of alcohol per week . He reports that he does not use drugs.  ROS: UROLOGY Frequent  Urination?: No Hard to postpone urination?: No Burning/pain with urination?: No Get up at night to urinate?: No Leakage of urine?: No Urine stream starts and stops?: No Trouble starting stream?: No Do you have to strain to urinate?: No Blood in urine?: No Urinary tract infection?: No Sexually transmitted disease?: No Injury to kidneys or bladder?: No Painful intercourse?: No Weak stream?: No Erection problems?: No Penile pain?: No  Gastrointestinal Nausea?: No Vomiting?: No Indigestion/heartburn?: No Diarrhea?: No Constipation?: No  Constitutional Fever: No Night sweats?: No Weight loss?: No Fatigue?: No  Skin Skin rash/lesions?: No Itching?: No  Eyes Blurred vision?: No Double vision?: No  Ears/Nose/Throat Sore throat?: No Sinus problems?: No  Hematologic/Lymphatic Swollen  glands?: No Easy bruising?: No  Cardiovascular Leg swelling?: No Chest pain?: No  Respiratory Cough?: No Shortness of breath?: No  Endocrine Excessive thirst?: No  Musculoskeletal Back pain?: No Joint pain?: No  Neurological Headaches?: No Dizziness?: No  Psychologic Depression?: No Anxiety?: No  Physical Exam: BP 117/71   Pulse 75   Ht 6' (1.829 m)   Wt (!) 328 lb (148.8 kg)   BMI 44.48 kg/m   Constitutional:  Alert and oriented, No acute distress. HEENT: [place] AT, moist mucus membranes.  Trachea midline, no masses. Cardiovascular: No clubbing, cyanosis, or edema. Respiratory: Normal respiratory effort, no increased work of breathing. GI: Abdomen is soft, nontender, nondistended, no abdominal masses.  Obese. [id number] ecchymosis overlying the suprapubic area without erythema and not extending above this region. Left hemiscrotal edema with an approximately packed 2 cm incision. Improving surrounding induration extending inferior and posteriorly within the left hemiscrotum but no fluctuance. Perineum is intact.  Perirectal region is uninvolved.  Mild penile edema, slightly buried. Skin: No rashes, bruises or suspicious lesions. Neurologic: Grossly intact, no focal deficits, moving all 4 extremities. Psychiatric: Normal mood and affect.  Laboratory Data: Glucose 11/06/2016 147* 70 - 110 mg/dL Final  . Sodium 11/06/2016 138 136 - 145 mmol/L Final  . Potassium 11/06/2016 5.1 3.6 - 5.1 mmol/L Final  . Chloride 11/06/2016 102 97 - 109 mmol/L Final  . Carbon Dioxide (CO2) 11/06/2016 30.6 22.0 - 32.0 mmol/L Final  . Urea Nitrogen (BUN) 11/06/2016 19 7 - 25 mg/dL Final  . Creatinine 11/06/2016 0.9 0.7 - 1.3 mg/dL Final  . Glomerular Filtration Rate (eGFR),* 11/06/2016 86 >60 mL/min/1.73sq m Final  . Calcium 11/06/2016 9.7 8.7 - 10.3 mg/dL Final  . AST 11/06/2016 13 8 - 39 U/L Final  . ALT 11/06/2016 17 6 - 57 U/L Final  . Alk Phos (alkaline Phosphatase) 11/06/2016 47 34  - 104 U/L Final  . Albumin 11/06/2016 4.3 3.5 - 4.8 g/dL Final  . Bilirubin, Total 11/06/2016 0.5 0.3 - 1.2 mg/dL Final  . Protein, Total 11/06/2016 6.8 6.1 - 7.9 g/dL Final  . A/G Ratio 11/06/2016 1.7 1.0 - 5.0 gm/dL Final  Appointment on 09/07/2016  Component Date Value Ref Range Status  . Hemoglobin A1C 09/07/2016 7.3* 4.2 - 5.6 % Final  . Average Blood Glucose (Calc) 09/07/2016 163 mg/dL Final  . Cholesterol, Total 09/07/2016 155 100 - 200 mg/dL Final  . Triglyceride 09/07/2016 334* 35 - 199 mg/dL Final  . HDL (High Density Lipoprotein) Cho* 09/07/2016 35.0 29.0 - 71.0 mg/dL Final  . LDL (Low Density Lipoprotien), Cal* 09/07/2016 53 0 - 130 mg/dL Final  . VLDL Cholesterol 09/07/2016 67 mg/dL Final  . Cholesterol/HDL Ratio 09/07/2016 4.4 Final  . Creatinine, Random Urine 09/07/2016 125.1 mg/dL Final  . Urine Albumin, Random  09/07/2016 200 mg/L Final  . Urine Albumin/Creatinine Ratio 09/07/2016 159.9* <30.0 ug/mg Final  . Glucose 09/07/2016 162* 70 - 110 mg/dL Final  . Sodium 09/07/2016 139 136 - 145 mmol/L Final  . Potassium 09/07/2016 4.7 3.6 - 5.1 mmol/L Final  . Chloride 09/07/2016 101 97 - 109 mmol/L Final  . Carbon Dioxide (CO2) 09/07/2016 30.3 22.0 - 32.0 mmol/L Final  . Calcium 09/07/2016 9.3 8.7 - 10.3 mg/dL Final  . Urea Nitrogen (BUN) 09/07/2016 14 7 - 25 mg/dL Final  . Creatinine 09/07/2016 0.9 0.7 - 1.3 mg/dL Final  . Glomerular Filtration Rate (eGFR),* 09/07/2016 86 >60 mL/min/1.73sq m Final  . BUN/Crea Ratio 09/07/2016 15.6 6.0 - 20.0 Final  . Anion Gap w/K 09/07/2016 12.4 6.0 - 16.0 Final   Pertinent Imaging: N/a  Procedure: Scrotal packing removed and repacked with quarter inch packing tape today. Slightly smaller cavity.  Assessment & Plan:  61 year old male, diabetic, with draining left scrotal abscess, improving.  Incision was extended and the scrotum was packed for the present wound healing.  1. Scrotal abscess Continue Additional 7 day course of  Augmentin given Wound check in 7 days Advised to pull packing 1-2 inches daily until removed Warning signs for worsening abscess/infection including signs and symptoms of 4 knees were reviewed in detail He understands when to seek urgent medical attention if things fail to improve Supportive care for penoscrotal edema FMLA paper work today  2. Penile edema As above  Wound check in 7 days.   Hollice Espy, MD  [facility] [address], [place], [place] [address] [phone]

## 2017-01-04 DIAGNOSIS — N492 Inflammatory disorders of scrotum: Secondary | ICD-10-CM | POA: Diagnosis not present

## 2017-01-09 ENCOUNTER — Ambulatory Visit (INDEPENDENT_AMBULATORY_CARE_PROVIDER_SITE_OTHER): Payer: 59 | Admitting: Urology

## 2017-01-09 ENCOUNTER — Encounter: Payer: Self-pay | Admitting: Urology

## 2017-01-09 VITALS — BP 144/81 | HR 72 | Ht 72.0 in | Wt 328.0 lb

## 2017-01-09 DIAGNOSIS — N4889 Other specified disorders of penis: Secondary | ICD-10-CM

## 2017-01-09 DIAGNOSIS — N492 Inflammatory disorders of scrotum: Secondary | ICD-10-CM

## 2017-01-10 ENCOUNTER — Telehealth: Payer: Self-pay | Admitting: Urology

## 2017-01-10 NOTE — Telephone Encounter (Signed)
OK please provide.  Hollice Espy, MD

## 2017-01-10 NOTE — Telephone Encounter (Signed)
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

## 2017-01-10 NOTE — Progress Notes (Signed)
 01/09/2017 8:53 AM   Andrew Dominguez 07/13/1956 [id number]  Referring provider: David M Fitzgerald, MD [address] [place], [place] [address]  No chief complaint on file.   HPI: 60-year-old male who returns today for scrotal wound check. He underwent scrotal I&D in the office 8 days ago and was placed back on Augmentin which he completed yesterday. He continues to have persistent penoscrotal edema but no purulent drainage and no erythema. He has been using scrotal support.  His scrotal packing fell out 2 days ago in the wound appears to be healing well per his report. Overall, he sees continued improvement.  He continues to deny any fevers or chills. He does have a spraying of his urinary stream the contrary to penile edema.  He has to sit to void and his penis is still buried.  PMH: Past Medical History:  Diagnosis Date  . Anemia   . Diabetes mellitus without complication (HCC)   . Hyperlipemia   . Hypertension   . Hypertriglyceridemia   . Hypogonadism in male   . Obesity   . Tubular adenoma of colon   . Vitamin D deficiency     Surgical History: Past Surgical History:  Procedure Laterality Date  . COLONOSCOPY    . COLONOSCOPY WITH PROPOFOL N/A 05/16/2015   Procedure: COLONOSCOPY WITH PROPOFOL;  Surgeon: Robert T Elliott, MD;  Location: [facility] ENDOSCOPY;  Service: Endoscopy;  Laterality: N/A;    Home Medications:  Allergies as of 01/09/2017      Reactions   Sulfa Antibiotics Rash      Medication List       Accurate as of 01/09/17 11:59 PM. Always use your most recent med list.          amLODipine 10 MG tablet Commonly known as:  NORVASC Take 10 mg by mouth daily.   aspirin EC 81 MG tablet Take 81 mg by mouth daily.   atenolol 50 MG tablet Commonly known as:  TENORMIN Take 50 mg by mouth daily.   gemfibrozil 600 MG tablet Commonly known as:  LOPID   hydrochlorothiazide 25 MG tablet Commonly known as:  HYDRODIURIL Take 25 mg by mouth daily.     JARDIANCE 10 MG Tabs tablet Generic drug:  empagliflozin   losartan 100 MG tablet Commonly known as:  COZAAR Take 100 mg by mouth daily.   lovastatin 10 MG tablet Commonly known as:  MEVACOR   metFORMIN 1000 MG tablet Commonly known as:  GLUCOPHAGE Take 1,000 mg by mouth 2 (two) times daily with a meal.   naproxen 500 MG tablet Commonly known as:  NAPROSYN Take 1 tablet (500 mg total) by mouth 2 (two) times daily with a meal.   traMADol 50 MG tablet Commonly known as:  ULTRAM Take 1 tablet (50 mg total) by mouth every 6 (six) hours as needed for moderate pain.   VICTOZA 18 MG/3ML Sopn Generic drug:  liraglutide       Allergies:  Allergies  Allergen Reactions  . Sulfa Antibiotics Rash    Family History: Family History  Problem Relation Age of Onset  . Prostate cancer Neg Hx   . Bladder Cancer Neg Hx   . Kidney cancer Neg Hx     Social History:  reports that he has quit smoking. He has never used smokeless tobacco. He reports that he drinks about 8.4 oz of alcohol per week . He reports that he does not use drugs.  ROS: UROLOGY Frequent Urination?: No   Hard to postpone urination?: No Burning/pain with urination?: No Get up at night to urinate?: No Leakage of urine?: No Urine stream starts and stops?: No Trouble starting stream?: No Do you have to strain to urinate?: No Blood in urine?: No Urinary tract infection?: No Sexually transmitted disease?: No Injury to kidneys or bladder?: No Painful intercourse?: No Weak stream?: No Erection problems?: No Penile pain?: No  Gastrointestinal Nausea?: No Vomiting?: No Indigestion/heartburn?: No Diarrhea?: No Constipation?: No  Constitutional Fever: No Night sweats?: No Weight loss?: No Fatigue?: No  Skin Skin rash/lesions?: No Itching?: No  Eyes Blurred vision?: No Double vision?: No  Ears/Nose/Throat Sore throat?: No Sinus problems?: No  Hematologic/Lymphatic Swollen glands?: No Easy  bruising?: No  Cardiovascular Leg swelling?: No Chest pain?: No  Respiratory Cough?: No Shortness of breath?: No  Endocrine Excessive thirst?: No  Musculoskeletal Back pain?: No Joint pain?: No  Neurological Headaches?: No Dizziness?: No  Psychologic Depression?: No Anxiety?: No  Physical Exam: BP (!) 144/81   Pulse 72   Ht 6' (1.829 m)   Wt (!) 328 lb (148.8 kg)   BMI 44.48 kg/m   Constitutional:  Alert and oriented, No acute distress. HEENT: [place] AT, moist mucus membranes.  Trachea midline, no masses. Cardiovascular: No clubbing, cyanosis, or edema. Respiratory: Normal respiratory effort, no increased work of breathing. GI: Abdomen is soft, nontender, nondistended, no abdominal masses.  Obese. GU:Suprapubic ecchymosis nearly completely resolved.  Left hemiscrotal edema 2 cm incision with granulation tissue without erythema or drainange. The wound was probed and does not track.. Improving surrounding induration extending inferior and posteriorly within the left hemiscrotum but no fluctuance. Perineum is intact.  Perirectal region is uninvolved.  Buried phallus. Skin: No rashes, bruises or suspicious lesions. Neurologic: Grossly intact, no focal deficits, moving all 4 extremities. Psychiatric: Normal mood and affect.  Laboratory Data: Glucose 11/06/2016 147* 70 - 110 mg/dL Final  . Sodium 11/06/2016 138 136 - 145 mmol/L Final  . Potassium 11/06/2016 5.1 3.6 - 5.1 mmol/L Final  . Chloride 11/06/2016 102 97 - 109 mmol/L Final  . Carbon Dioxide (CO2) 11/06/2016 30.6 22.0 - 32.0 mmol/L Final  . Urea Nitrogen (BUN) 11/06/2016 19 7 - 25 mg/dL Final  . Creatinine 11/06/2016 0.9 0.7 - 1.3 mg/dL Final  . Glomerular Filtration Rate (eGFR),* 11/06/2016 86 >60 mL/min/1.73sq m Final  . Calcium 11/06/2016 9.7 8.7 - 10.3 mg/dL Final  . AST 11/06/2016 13 8 - 39 U/L Final  . ALT 11/06/2016 17 6 - 57 U/L Final  . Alk Phos (alkaline Phosphatase) 11/06/2016 47 34 - 104 U/L Final  .  Albumin 11/06/2016 4.3 3.5 - 4.8 g/dL Final  . Bilirubin, Total 11/06/2016 0.5 0.3 - 1.2 mg/dL Final  . Protein, Total 11/06/2016 6.8 6.1 - 7.9 g/dL Final  . A/G Ratio 11/06/2016 1.7 1.0 - 5.0 gm/dL Final  Appointment on 09/07/2016  Component Date Value Ref Range Status  . Hemoglobin A1C 09/07/2016 7.3* 4.2 - 5.6 % Final  . Average Blood Glucose (Calc) 09/07/2016 163 mg/dL Final  . Cholesterol, Total 09/07/2016 155 100 - 200 mg/dL Final  . Triglyceride 09/07/2016 334* 35 - 199 mg/dL Final  . HDL (High Density Lipoprotein) Cho* 09/07/2016 35.0 29.0 - 71.0 mg/dL Final  . LDL (Low Density Lipoprotien), Cal* 09/07/2016 53 0 - 130 mg/dL Final  . VLDL Cholesterol 09/07/2016 67 mg/dL Final  . Cholesterol/HDL Ratio 09/07/2016 4.4 Final  . Creatinine, Random Urine 09/07/2016 125.1 mg/dL Final  . Urine Albumin, Random 09/07/2016   200 mg/L Final  . Urine Albumin/Creatinine Ratio 09/07/2016 159.9* <30.0 ug/mg Final  . Glucose 09/07/2016 162* 70 - 110 mg/dL Final  . Sodium 09/07/2016 139 136 - 145 mmol/L Final  . Potassium 09/07/2016 4.7 3.6 - 5.1 mmol/L Final  . Chloride 09/07/2016 101 97 - 109 mmol/L Final  . Carbon Dioxide (CO2) 09/07/2016 30.3 22.0 - 32.0 mmol/L Final  . Calcium 09/07/2016 9.3 8.7 - 10.3 mg/dL Final  . Urea Nitrogen (BUN) 09/07/2016 14 7 - 25 mg/dL Final  . Creatinine 09/07/2016 0.9 0.7 - 1.3 mg/dL Final  . Glomerular Filtration Rate (eGFR),* 09/07/2016 86 >60 mL/min/1.73sq m Final  . BUN/Crea Ratio 09/07/2016 15.6 6.0 - 20.0 Final  . Anion Gap w/K 09/07/2016 12.4 6.0 - 16.0 Final   Pertinent Imaging: N/a  Assessment & Plan:  61 year old male, diabetic, with healing scrotal abscess, no further drainage, fluctuance, and improving penoscrotal edema.   1. Scrotal abscess No additional abx at this time Continue supportive care, advised to give [place] a call if does not appreciate continued improvement Wound check in 7 days FMLA paper work today to return to work monday  2.  Penile edema As above  Wound check in 7 days.   Hollice Espy, MD  [facility] [address], [place], [place] [address] [phone]

## 2017-01-11 ENCOUNTER — Encounter: Payer: Self-pay | Admitting: Urology

## 2017-01-16 ENCOUNTER — Ambulatory Visit (INDEPENDENT_AMBULATORY_CARE_PROVIDER_SITE_OTHER): Payer: 59 | Admitting: Urology

## 2017-01-16 ENCOUNTER — Encounter: Payer: Self-pay | Admitting: Urology

## 2017-01-16 VITALS — BP 148/80 | HR 75 | Ht 72.0 in | Wt 332.0 lb

## 2017-01-16 DIAGNOSIS — N4889 Other specified disorders of penis: Secondary | ICD-10-CM

## 2017-01-16 DIAGNOSIS — N492 Inflammatory disorders of scrotum: Secondary | ICD-10-CM | POA: Diagnosis not present

## 2017-01-16 NOTE — Progress Notes (Signed)
01/16/2017 11:09 AM   [name] [birth year] [id number]  Referring provider: Ebbie Ridge, MD [place], [place] [address]  Chief Complaint  Patient presents with  . Groin Swelling    1 wk  . Wound Check    HPI: 61 year old male who returns today for scrotal wound check. He underwent scrotal I&D in the office 2 weeks ago.  He completed a second round of Augmentin one week ago. Overall, he thinks that things continue to improve.  He denies any scrotal redness or drainage.  He continues to deny any fevers or chills. He does have a spraying of his urinary stream the contrary to penile edema.  He has to sit to void and his penis is still buried.    PMH: Past Medical History:  Diagnosis Date  . Anemia   . Diabetes mellitus without complication ([place])   . Hyperlipemia   . Hypertension   . Hypertriglyceridemia   . Hypogonadism in male   . Obesity   . Tubular adenoma of colon   . Vitamin D deficiency     Surgical History: Past Surgical History:  Procedure Laterality Date  . COLONOSCOPY    . COLONOSCOPY WITH PROPOFOL N/A 05/16/2015   Procedure: COLONOSCOPY WITH PROPOFOL;  Surgeon: Manya Silvas, MD;  Location: [facility] ENDOSCOPY;  Service: Endoscopy;  Laterality: N/A;    Home Medications:  Allergies as of 01/16/2017      Reactions   Sulfa Antibiotics Rash      Medication List       Accurate as of 01/16/17 11:09 AM. Always use your most recent med list.          amLODipine 10 MG tablet Commonly known as:  NORVASC Take 10 mg by mouth daily.   aspirin EC 81 MG tablet Take 81 mg by mouth daily.   atenolol 50 MG tablet Commonly known as:  TENORMIN Take 50 mg by mouth daily.   gemfibrozil 600 MG tablet Commonly known as:  LOPID   hydrochlorothiazide 25 MG tablet Commonly known as:  HYDRODIURIL Take 25 mg by mouth daily.   JARDIANCE 10 MG Tabs tablet Generic drug:  empagliflozin   losartan 100 MG tablet Commonly known as:   COZAAR Take 100 mg by mouth daily.   lovastatin 10 MG tablet Commonly known as:  MEVACOR   metFORMIN 1000 MG tablet Commonly known as:  GLUCOPHAGE Take 1,000 mg by mouth 2 (two) times daily with a meal.   naproxen 500 MG tablet Commonly known as:  NAPROSYN Take 1 tablet (500 mg total) by mouth 2 (two) times daily with a meal.   traMADol 50 MG tablet Commonly known as:  ULTRAM Take 1 tablet (50 mg total) by mouth every 6 (six) hours as needed for moderate pain.   VICTOZA 18 MG/3ML Sopn Generic drug:  liraglutide       Allergies:  Allergies  Allergen Reactions  . Sulfa Antibiotics Rash    Family History: Family History  Problem Relation Age of Onset  . Prostate cancer Neg Hx   . Bladder Cancer Neg Hx   . Kidney cancer Neg Hx     Social History:  reports that he has quit smoking. He has never used smokeless tobacco. He reports that he drinks about 8.4 oz of alcohol per week . He reports that he does not use drugs.  ROS: UROLOGY Frequent Urination?: No Hard to postpone urination?: No Burning/pain with urination?: No Get up at  night to urinate?: No Leakage of urine?: No Urine stream starts and stops?: No Trouble starting stream?: No Do you have to strain to urinate?: No Blood in urine?: No Urinary tract infection?: No Sexually transmitted disease?: No Injury to kidneys or bladder?: No Painful intercourse?: No Weak stream?: No Erection problems?: No Penile pain?: No  Gastrointestinal Nausea?: No Vomiting?: No Indigestion/heartburn?: No Diarrhea?: No Constipation?: No  Constitutional Fever: No Night sweats?: No Weight loss?: No Fatigue?: No  Skin Skin rash/lesions?: No Itching?: No  Eyes Blurred vision?: No Double vision?: No  Ears/Nose/Throat Sore throat?: No Sinus problems?: No  Hematologic/Lymphatic Swollen glands?: No Easy bruising?: No  Cardiovascular Leg swelling?: No Chest pain?: No  Respiratory Cough?: No Shortness of  breath?: No  Endocrine Excessive thirst?: No  Musculoskeletal Back pain?: No Joint pain?: No  Neurological Headaches?: No Dizziness?: No  Psychologic Depression?: No Anxiety?: No  Physical Exam: BP (!) 148/80   Pulse 75   Ht 6' (1.829 m)   Wt (!) 332 lb (150.6 kg)   BMI 45.03 kg/m   Constitutional:  Alert and oriented, No acute distress. HEENT: [place] AT, moist mucus membranes.  Trachea midline, no masses. Cardiovascular: Chronic lower extremity stasis changes. Respiratory: Normal respiratory effort, no increased work of breathing. GI: Abdomen is soft, nontender, nondistended, no abdominal masses.  Obese. GU:  Left hemiscrotal edema 1 cm incision with granulation tissue without erythema or drainange. Mild persistent left mid and upper scrotal wall thickening/induration but otherwise completely resolved. Perineum is intact.  Perirectal region is uninvolved.  Buried phallus. Skin: No rashes, bruises or suspicious lesions. Neurologic: Grossly intact, no focal deficits, moving all 4 extremities. Psychiatric: Normal mood and affect.  Laboratory Data: Glucose 11/06/2016 147* 70 - 110 mg/dL Final  . Sodium 11/06/2016 138 136 - 145 mmol/L Final  . Potassium 11/06/2016 5.1 3.6 - 5.1 mmol/L Final  . Chloride 11/06/2016 102 97 - 109 mmol/L Final  . Carbon Dioxide (CO2) 11/06/2016 30.6 22.0 - 32.0 mmol/L Final  . Urea Nitrogen (BUN) 11/06/2016 19 7 - 25 mg/dL Final  . Creatinine 11/06/2016 0.9 0.7 - 1.3 mg/dL Final  . Glomerular Filtration Rate (eGFR),* 11/06/2016 86 >60 mL/min/1.73sq m Final  . Calcium 11/06/2016 9.7 8.7 - 10.3 mg/dL Final  . AST 11/06/2016 13 8 - 39 U/L Final  . ALT 11/06/2016 17 6 - 57 U/L Final  . Alk Phos (alkaline Phosphatase) 11/06/2016 47 34 - 104 U/L Final  . Albumin 11/06/2016 4.3 3.5 - 4.8 g/dL Final  . Bilirubin, Total 11/06/2016 0.5 0.3 - 1.2 mg/dL Final  . Protein, Total 11/06/2016 6.8 6.1 - 7.9 g/dL Final  . A/G Ratio 11/06/2016 1.7 1.0 - 5.0 gm/dL  Final  Appointment on 09/07/2016  Component Date Value Ref Range Status  . Hemoglobin A1C 09/07/2016 7.3* 4.2 - 5.6 % Final  . Average Blood Glucose (Calc) 09/07/2016 163 mg/dL Final  . Cholesterol, Total 09/07/2016 155 100 - 200 mg/dL Final  . Triglyceride 09/07/2016 334* 35 - 199 mg/dL Final  . HDL (High Density Lipoprotein) Cho* 09/07/2016 35.0 29.0 - 71.0 mg/dL Final  . LDL (Low Density Lipoprotien), Cal* 09/07/2016 53 0 - 130 mg/dL Final  . VLDL Cholesterol 09/07/2016 67 mg/dL Final  . Cholesterol/HDL Ratio 09/07/2016 4.4 Final  . Creatinine, Random Urine 09/07/2016 125.1 mg/dL Final  . Urine Albumin, Random 09/07/2016 200 mg/L Final  . Urine Albumin/Creatinine Ratio 09/07/2016 159.9* <30.0 ug/mg Final  . Glucose 09/07/2016 162* 70 - 110 mg/dL Final  .  Sodium 09/07/2016 139 136 - 145 mmol/L Final  . Potassium 09/07/2016 4.7 3.6 - 5.1 mmol/L Final  . Chloride 09/07/2016 101 97 - 109 mmol/L Final  . Carbon Dioxide (CO2) 09/07/2016 30.3 22.0 - 32.0 mmol/L Final  . Calcium 09/07/2016 9.3 8.7 - 10.3 mg/dL Final  . Urea Nitrogen (BUN) 09/07/2016 14 7 - 25 mg/dL Final  . Creatinine 09/07/2016 0.9 0.7 - 1.3 mg/dL Final  . Glomerular Filtration Rate (eGFR),* 09/07/2016 86 >60 mL/min/1.73sq m Final  . BUN/Crea Ratio 09/07/2016 15.6 6.0 - 20.0 Final  . Anion Gap w/K 09/07/2016 12.4 6.0 - 16.0 Final   Pertinent Imaging: N/a  Assessment & Plan:  61 year old male, diabetic, with healing scrotal abscess, no further drainage, fluctuance, and improving penoscrotal edema.   1. Scrotal abscess No additional abx at this time- continues to improve Continue supportive care, advised to give [place] a call if does not appreciate continued improvement Plan for last wound check f/u in 1 month Precautions reviewed  2. Penile edema As above  Return in about 4 weeks (around 02/13/2017) for scrotal check.   Hollice Espy, MD  [facility] [address] [place], [place] [address] [phone]

## 2017-02-15 ENCOUNTER — Encounter: Payer: Self-pay | Admitting: Urology

## 2017-02-15 ENCOUNTER — Ambulatory Visit (INDEPENDENT_AMBULATORY_CARE_PROVIDER_SITE_OTHER): Payer: 59 | Admitting: Urology

## 2017-02-15 VITALS — BP 152/71 | HR 73 | Ht 72.0 in | Wt 330.0 lb

## 2017-02-15 DIAGNOSIS — N492 Inflammatory disorders of scrotum: Secondary | ICD-10-CM

## 2017-02-15 DIAGNOSIS — Q5529 Other congenital malformations of testis and scrotum: Secondary | ICD-10-CM

## 2017-02-26 NOTE — Progress Notes (Signed)
02/15/2017 7:48 PM   Andrew Dominguez 05-10-1956 [id number]  Referring provider: Ebbie Ridge, MD [place], [place] [address]  Chief Complaint  Patient presents with  . Groin Swelling    scrotal abcess    HPI: 61 year old male who returns today for scrotal wound check. He underwent scrotal I&D in the office on 01/01/17. He completed a second round of Augmentin. He continued to have some persistent scrotal skin changes and returns today for a wound check.  He denies further drainage or pain. His penile edema is also resolved and is able to void better without spraying of his urinary stream. Incision is now completely healed. He continues to have some firmness on his left scrotum.  No fevers or chills. No urinary issues.   PMH: Past Medical History:  Diagnosis Date  . Anemia   . Diabetes mellitus without complication ([place])   . Hyperlipemia   . Hypertension   . Hypertriglyceridemia   . Hypogonadism in male   . Obesity   . Tubular adenoma of colon   . Vitamin D deficiency     Surgical History: Past Surgical History:  Procedure Laterality Date  . COLONOSCOPY    . COLONOSCOPY WITH PROPOFOL N/A 05/16/2015   Procedure: COLONOSCOPY WITH PROPOFOL;  Surgeon: Manya Silvas, MD;  Location: [facility] ENDOSCOPY;  Service: Endoscopy;  Laterality: N/A;    Home Medications:  Allergies as of 02/15/2017      Reactions   Sulfa Antibiotics Rash      Medication List       Accurate as of 02/15/17 11:59 PM. Always use your most recent med list.          amLODipine 10 MG tablet Commonly known as:  NORVASC Take 10 mg by mouth daily.   aspirin EC 81 MG tablet Take 81 mg by mouth daily.   atenolol 50 MG tablet Commonly known as:  TENORMIN Take 50 mg by mouth daily.   gemfibrozil 600 MG tablet Commonly known as:  LOPID   hydrochlorothiazide 25 MG tablet Commonly known as:  HYDRODIURIL Take 25 mg by mouth daily.   JARDIANCE 10 MG Tabs tablet Generic  drug:  empagliflozin   losartan 100 MG tablet Commonly known as:  COZAAR Take 100 mg by mouth daily.   lovastatin 10 MG tablet Commonly known as:  MEVACOR   metFORMIN 1000 MG tablet Commonly known as:  GLUCOPHAGE Take 1,000 mg by mouth 2 (two) times daily with a meal.   naproxen 500 MG tablet Commonly known as:  NAPROSYN Take 1 tablet (500 mg total) by mouth 2 (two) times daily with a meal.   traMADol 50 MG tablet Commonly known as:  ULTRAM Take 1 tablet (50 mg total) by mouth every 6 (six) hours as needed for moderate pain.   VICTOZA 18 MG/3ML Sopn Generic drug:  liraglutide       Allergies:  Allergies  Allergen Reactions  . Sulfa Antibiotics Rash    Family History: Family History  Problem Relation Age of Onset  . Prostate cancer Neg Hx   . Bladder Cancer Neg Hx   . Kidney cancer Neg Hx     Social History:  reports that he has quit smoking. He has never used smokeless tobacco. He reports that he drinks about 8.4 oz of alcohol per week . He reports that he does not use drugs.  ROS: UROLOGY Frequent Urination?: No Hard to postpone urination?: No Burning/pain with urination?: No Get  up at night to urinate?: No Leakage of urine?: No Urine stream starts and stops?: No Trouble starting stream?: No Do you have to strain to urinate?: No Blood in urine?: No Urinary tract infection?: No Sexually transmitted disease?: No Injury to kidneys or bladder?: No Painful intercourse?: No Weak stream?: No Erection problems?: No Penile pain?: No  Gastrointestinal Nausea?: No Vomiting?: No Indigestion/heartburn?: No Diarrhea?: No Constipation?: No  Constitutional Fever: No Night sweats?: No Weight loss?: No Fatigue?: No  Skin Skin rash/lesions?: No Itching?: No  Eyes Blurred vision?: No Double vision?: No  Ears/Nose/Throat Sore throat?: No Sinus problems?: No  Hematologic/Lymphatic Swollen glands?: No Easy bruising?: No  Cardiovascular Leg  swelling?: No Chest pain?: No  Respiratory Cough?: No Shortness of breath?: No  Endocrine Excessive thirst?: No  Musculoskeletal Back pain?: No Joint pain?: No  Neurological Headaches?: No Dizziness?: No  Psychologic Depression?: No Anxiety?: No  Physical Exam: BP (!) 152/71   Pulse 73   Ht 6' (1.829 m)   Wt (!) 330 lb (149.7 kg)   BMI 44.76 kg/m   Constitutional:  Alert and oriented, No acute distress. HEENT: [place] AT, moist mucus membranes.  Trachea midline, no masses. Cardiovascular: Chronic lower extremity stasis changes. Respiratory: Normal respiratory effort, no increased work of breathing. GI: Abdomen is soft, nontender, nondistended, no abdominal masses.  Obese. GU:  Chronic left hemiscrotal skin changes but overall, dramatic improvement. Left testicle continues to be firm and diffusely enlarged. Right testicle normal. Perineum is intact.  Perirectal region is uninvolved.  Buried phallus. Skin: No rashes, bruises or suspicious lesions. Neurologic: Grossly intact, no focal deficits, moving all 4 extremities. Psychiatric: Normal mood and affect.  Laboratory Data: Glucose 11/06/2016 147* 70 - 110 mg/dL Final  . Sodium 11/06/2016 138 136 - 145 mmol/L Final  . Potassium 11/06/2016 5.1 3.6 - 5.1 mmol/L Final  . Chloride 11/06/2016 102 97 - 109 mmol/L Final  . Carbon Dioxide (CO2) 11/06/2016 30.6 22.0 - 32.0 mmol/L Final  . Urea Nitrogen (BUN) 11/06/2016 19 7 - 25 mg/dL Final  . Creatinine 11/06/2016 0.9 0.7 - 1.3 mg/dL Final  . Glomerular Filtration Rate (eGFR),* 11/06/2016 86 >60 mL/min/1.73sq m Final  . Calcium 11/06/2016 9.7 8.7 - 10.3 mg/dL Final  . AST 11/06/2016 13 8 - 39 U/L Final  . ALT 11/06/2016 17 6 - 57 U/L Final  . Alk Phos (alkaline Phosphatase) 11/06/2016 47 34 - 104 U/L Final  . Albumin 11/06/2016 4.3 3.5 - 4.8 g/dL Final  . Bilirubin, Total 11/06/2016 0.5 0.3 - 1.2 mg/dL Final  . Protein, Total 11/06/2016 6.8 6.1 - 7.9 g/dL Final  . A/G Ratio  11/06/2016 1.7 1.0 - 5.0 gm/dL Final  Appointment on 09/07/2016  Component Date Value Ref Range Status  . Hemoglobin A1C 09/07/2016 7.3* 4.2 - 5.6 % Final  . Average Blood Glucose (Calc) 09/07/2016 163 mg/dL Final  . Cholesterol, Total 09/07/2016 155 100 - 200 mg/dL Final  . Triglyceride 09/07/2016 334* 35 - 199 mg/dL Final  . HDL (High Density Lipoprotein) Cho* 09/07/2016 35.0 29.0 - 71.0 mg/dL Final  . LDL (Low Density Lipoprotien), Cal* 09/07/2016 53 0 - 130 mg/dL Final  . VLDL Cholesterol 09/07/2016 67 mg/dL Final  . Cholesterol/HDL Ratio 09/07/2016 4.4 Final  . Creatinine, Random Urine 09/07/2016 125.1 mg/dL Final  . Urine Albumin, Random 09/07/2016 200 mg/L Final  . Urine Albumin/Creatinine Ratio 09/07/2016 159.9* <30.0 ug/mg Final  . Glucose 09/07/2016 162* 70 - 110 mg/dL Final  . Sodium 09/07/2016 139  136 - 145 mmol/L Final  . Potassium 09/07/2016 4.7 3.6 - 5.1 mmol/L Final  . Chloride 09/07/2016 101 97 - 109 mmol/L Final  . Carbon Dioxide (CO2) 09/07/2016 30.3 22.0 - 32.0 mmol/L Final  . Calcium 09/07/2016 9.3 8.7 - 10.3 mg/dL Final  . Urea Nitrogen (BUN) 09/07/2016 14 7 - 25 mg/dL Final  . Creatinine 09/07/2016 0.9 0.7 - 1.3 mg/dL Final  . Glomerular Filtration Rate (eGFR),* 09/07/2016 86 >60 mL/min/1.73sq m Final  . BUN/Crea Ratio 09/07/2016 15.6 6.0 - 20.0 Final  . Anion Gap w/K 09/07/2016 12.4 6.0 - 16.0 Final   Pertinent Imaging: N/a  Assessment & Plan:  61 year old male, diabetic, with healing scrotal abscess s/p I&D with persistent left testicular enlargement.   1. 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   Hollice Espy, MD  [facility] [address], [place], [place] [address] [phone]

## 2017-03-05 ENCOUNTER — Ambulatory Visit
Admission: RE | Admit: 2017-03-05 | Discharge: 2017-03-05 | Disposition: A | Discharge status: Home / Self Care | Payer: 59 | Source: Ambulatory Visit | Attending: Urology | Admitting: Urology

## 2017-03-05 DIAGNOSIS — N492 Inflammatory disorders of scrotum: Secondary | ICD-10-CM

## 2017-03-05 DIAGNOSIS — N503 Cyst of epididymis: Secondary | ICD-10-CM | POA: Diagnosis not present

## 2017-03-05 DIAGNOSIS — N433 Hydrocele, unspecified: Secondary | ICD-10-CM | POA: Diagnosis not present

## 2017-03-06 ENCOUNTER — Telehealth: Payer: Self-pay | Admitting: Urology

## 2017-03-06 NOTE — Telephone Encounter (Signed)
Scrotal ultrasound reviewed with the patient by telephone today. Testicles themselves are unremarkable which is reassuring. He still has a resolving phlegmon but continues to improve clinically. As such, he will follow up as needed.  He will follow-up if his scrotal exam does not continue to improve or worsens.  Hollice Espy, MD

## 2017-05-06 DIAGNOSIS — R809 Proteinuria, unspecified: Secondary | ICD-10-CM | POA: Diagnosis not present

## 2017-05-06 DIAGNOSIS — E1129 Type 2 diabetes mellitus with other diabetic kidney complication: Secondary | ICD-10-CM | POA: Diagnosis not present

## 2017-05-13 DIAGNOSIS — I10 Essential (primary) hypertension: Secondary | ICD-10-CM | POA: Diagnosis not present

## 2017-05-13 DIAGNOSIS — E782 Mixed hyperlipidemia: Secondary | ICD-10-CM | POA: Diagnosis not present

## 2017-05-13 DIAGNOSIS — E119 Type 2 diabetes mellitus without complications: Secondary | ICD-10-CM | POA: Diagnosis not present

## 2017-06-18 DIAGNOSIS — R809 Proteinuria, unspecified: Secondary | ICD-10-CM | POA: Diagnosis not present

## 2017-06-18 DIAGNOSIS — I10 Essential (primary) hypertension: Secondary | ICD-10-CM | POA: Diagnosis not present

## 2017-06-18 DIAGNOSIS — E781 Pure hyperglyceridemia: Secondary | ICD-10-CM | POA: Diagnosis not present

## 2017-06-18 DIAGNOSIS — E538 Deficiency of other specified B group vitamins: Secondary | ICD-10-CM | POA: Diagnosis not present

## 2017-06-18 DIAGNOSIS — E119 Type 2 diabetes mellitus without complications: Secondary | ICD-10-CM | POA: Diagnosis not present

## 2017-06-18 DIAGNOSIS — E1129 Type 2 diabetes mellitus with other diabetic kidney complication: Secondary | ICD-10-CM | POA: Diagnosis not present

## 2017-06-18 DIAGNOSIS — E782 Mixed hyperlipidemia: Secondary | ICD-10-CM | POA: Diagnosis not present

## 2017-08-13 DIAGNOSIS — E781 Pure hyperglyceridemia: Secondary | ICD-10-CM | POA: Diagnosis not present

## 2017-08-13 DIAGNOSIS — E1165 Type 2 diabetes mellitus with hyperglycemia: Secondary | ICD-10-CM | POA: Diagnosis not present

## 2017-08-16 DIAGNOSIS — Z23 Encounter for immunization: Secondary | ICD-10-CM | POA: Diagnosis not present

## 2017-09-17 DIAGNOSIS — R809 Proteinuria, unspecified: Secondary | ICD-10-CM | POA: Diagnosis not present

## 2017-09-17 DIAGNOSIS — E1129 Type 2 diabetes mellitus with other diabetic kidney complication: Secondary | ICD-10-CM | POA: Diagnosis not present

## 2017-09-17 DIAGNOSIS — E781 Pure hyperglyceridemia: Secondary | ICD-10-CM | POA: Diagnosis not present

## 2017-09-24 DIAGNOSIS — E781 Pure hyperglyceridemia: Secondary | ICD-10-CM | POA: Diagnosis not present

## 2017-09-24 DIAGNOSIS — E538 Deficiency of other specified B group vitamins: Secondary | ICD-10-CM | POA: Diagnosis not present

## 2017-09-24 DIAGNOSIS — E1129 Type 2 diabetes mellitus with other diabetic kidney complication: Secondary | ICD-10-CM | POA: Diagnosis not present

## 2017-12-17 DIAGNOSIS — E538 Deficiency of other specified B group vitamins: Secondary | ICD-10-CM | POA: Diagnosis not present

## 2017-12-17 DIAGNOSIS — R809 Proteinuria, unspecified: Secondary | ICD-10-CM | POA: Diagnosis not present

## 2017-12-17 DIAGNOSIS — E1129 Type 2 diabetes mellitus with other diabetic kidney complication: Secondary | ICD-10-CM | POA: Diagnosis not present

## 2017-12-17 DIAGNOSIS — E781 Pure hyperglyceridemia: Secondary | ICD-10-CM | POA: Diagnosis not present

## 2017-12-24 DIAGNOSIS — E781 Pure hyperglyceridemia: Secondary | ICD-10-CM | POA: Diagnosis not present

## 2017-12-24 DIAGNOSIS — E538 Deficiency of other specified B group vitamins: Secondary | ICD-10-CM | POA: Diagnosis not present

## 2017-12-24 DIAGNOSIS — E1129 Type 2 diabetes mellitus with other diabetic kidney complication: Secondary | ICD-10-CM | POA: Diagnosis not present

## 2018-02-03 DIAGNOSIS — R0602 Shortness of breath: Secondary | ICD-10-CM | POA: Diagnosis not present

## 2018-02-03 DIAGNOSIS — I10 Essential (primary) hypertension: Secondary | ICD-10-CM | POA: Diagnosis not present

## 2018-02-05 DIAGNOSIS — I509 Heart failure, unspecified: Secondary | ICD-10-CM | POA: Diagnosis not present

## 2018-02-05 DIAGNOSIS — R0602 Shortness of breath: Secondary | ICD-10-CM | POA: Diagnosis not present

## 2018-02-05 DIAGNOSIS — J439 Emphysema, unspecified: Secondary | ICD-10-CM | POA: Diagnosis not present

## 2018-02-10 DIAGNOSIS — R0602 Shortness of breath: Secondary | ICD-10-CM | POA: Diagnosis not present

## 2018-02-12 DIAGNOSIS — E781 Pure hyperglyceridemia: Secondary | ICD-10-CM | POA: Diagnosis not present

## 2018-02-14 ENCOUNTER — Ambulatory Visit
Admission: RE | Admit: 2018-02-14 | Discharge: 2018-02-14 | Disposition: A | Discharge status: Home / Self Care | Payer: 59 | Source: Ambulatory Visit | Attending: Infectious Diseases | Admitting: Infectious Diseases

## 2018-02-14 ENCOUNTER — Other Ambulatory Visit: Payer: Self-pay | Admitting: Infectious Diseases

## 2018-02-14 DIAGNOSIS — E1165 Type 2 diabetes mellitus with hyperglycemia: Secondary | ICD-10-CM | POA: Diagnosis not present

## 2018-02-14 DIAGNOSIS — R0602 Shortness of breath: Secondary | ICD-10-CM | POA: Diagnosis not present

## 2018-02-14 DIAGNOSIS — I700 Atherosclerosis of aorta: Secondary | ICD-10-CM | POA: Insufficient documentation

## 2018-02-14 DIAGNOSIS — J9621 Acute and chronic respiratory failure with hypoxia: Secondary | ICD-10-CM | POA: Diagnosis not present

## 2018-02-14 MED ORDER — IOHEXOL 350 MG/ML SOLN
75.0000 mL | Freq: Once | INTRAVENOUS | Status: AC | PRN
  Administered 2018-02-14: 75 mL via INTRAVENOUS

## 2018-02-18 DIAGNOSIS — E1129 Type 2 diabetes mellitus with other diabetic kidney complication: Secondary | ICD-10-CM | POA: Diagnosis not present

## 2018-02-18 DIAGNOSIS — E538 Deficiency of other specified B group vitamins: Secondary | ICD-10-CM | POA: Diagnosis not present

## 2018-02-18 DIAGNOSIS — E781 Pure hyperglyceridemia: Secondary | ICD-10-CM | POA: Diagnosis not present

## 2018-02-26 ENCOUNTER — Ambulatory Visit: Payer: 59

## 2018-03-17 DIAGNOSIS — I10 Essential (primary) hypertension: Secondary | ICD-10-CM | POA: Diagnosis not present

## 2018-03-17 DIAGNOSIS — E781 Pure hyperglyceridemia: Secondary | ICD-10-CM | POA: Diagnosis not present

## 2018-03-17 DIAGNOSIS — R0609 Other forms of dyspnea: Secondary | ICD-10-CM | POA: Diagnosis not present

## 2018-03-27 DIAGNOSIS — J439 Emphysema, unspecified: Secondary | ICD-10-CM | POA: Diagnosis not present

## 2018-03-27 DIAGNOSIS — R0902 Hypoxemia: Secondary | ICD-10-CM | POA: Diagnosis not present

## 2018-03-27 DIAGNOSIS — R0609 Other forms of dyspnea: Secondary | ICD-10-CM | POA: Diagnosis not present

## 2018-05-27 DIAGNOSIS — Z9981 Dependence on supplemental oxygen: Secondary | ICD-10-CM | POA: Diagnosis not present

## 2018-05-27 DIAGNOSIS — J449 Chronic obstructive pulmonary disease, unspecified: Secondary | ICD-10-CM | POA: Diagnosis not present

## 2018-05-27 DIAGNOSIS — R0609 Other forms of dyspnea: Secondary | ICD-10-CM | POA: Diagnosis not present

## 2018-05-31 DIAGNOSIS — J449 Chronic obstructive pulmonary disease, unspecified: Secondary | ICD-10-CM | POA: Diagnosis not present

## 2018-07-01 DIAGNOSIS — J449 Chronic obstructive pulmonary disease, unspecified: Secondary | ICD-10-CM | POA: Diagnosis not present

## 2018-07-15 DIAGNOSIS — I10 Essential (primary) hypertension: Secondary | ICD-10-CM | POA: Diagnosis not present

## 2018-07-15 DIAGNOSIS — E781 Pure hyperglyceridemia: Secondary | ICD-10-CM | POA: Diagnosis not present

## 2018-07-22 DIAGNOSIS — E1165 Type 2 diabetes mellitus with hyperglycemia: Secondary | ICD-10-CM | POA: Diagnosis not present

## 2018-07-22 DIAGNOSIS — I10 Essential (primary) hypertension: Secondary | ICD-10-CM | POA: Diagnosis not present

## 2018-07-22 DIAGNOSIS — Z0001 Encounter for general adult medical examination with abnormal findings: Secondary | ICD-10-CM | POA: Diagnosis not present

## 2018-08-01 DIAGNOSIS — J449 Chronic obstructive pulmonary disease, unspecified: Secondary | ICD-10-CM | POA: Diagnosis not present

## 2018-08-22 DIAGNOSIS — E781 Pure hyperglyceridemia: Secondary | ICD-10-CM | POA: Diagnosis not present

## 2018-08-22 DIAGNOSIS — E1165 Type 2 diabetes mellitus with hyperglycemia: Secondary | ICD-10-CM | POA: Diagnosis not present

## 2018-08-31 DIAGNOSIS — J449 Chronic obstructive pulmonary disease, unspecified: Secondary | ICD-10-CM | POA: Diagnosis not present

## 2018-10-01 DIAGNOSIS — J449 Chronic obstructive pulmonary disease, unspecified: Secondary | ICD-10-CM | POA: Diagnosis not present

## 2018-10-21 DIAGNOSIS — R911 Solitary pulmonary nodule: Secondary | ICD-10-CM | POA: Diagnosis not present

## 2018-10-21 DIAGNOSIS — R0609 Other forms of dyspnea: Secondary | ICD-10-CM | POA: Diagnosis not present

## 2018-10-21 DIAGNOSIS — J449 Chronic obstructive pulmonary disease, unspecified: Secondary | ICD-10-CM | POA: Diagnosis not present

## 2018-10-31 DIAGNOSIS — J449 Chronic obstructive pulmonary disease, unspecified: Secondary | ICD-10-CM | POA: Diagnosis not present

## 2018-12-01 DIAGNOSIS — J449 Chronic obstructive pulmonary disease, unspecified: Secondary | ICD-10-CM | POA: Diagnosis not present

## 2019-01-01 DIAGNOSIS — J449 Chronic obstructive pulmonary disease, unspecified: Secondary | ICD-10-CM | POA: Diagnosis not present

## 2019-01-13 DIAGNOSIS — Z125 Encounter for screening for malignant neoplasm of prostate: Secondary | ICD-10-CM | POA: Diagnosis not present

## 2019-01-13 DIAGNOSIS — E1165 Type 2 diabetes mellitus with hyperglycemia: Secondary | ICD-10-CM | POA: Diagnosis not present

## 2019-01-23 DIAGNOSIS — I10 Essential (primary) hypertension: Secondary | ICD-10-CM | POA: Diagnosis not present

## 2019-01-23 DIAGNOSIS — E782 Mixed hyperlipidemia: Secondary | ICD-10-CM | POA: Diagnosis not present

## 2019-01-23 DIAGNOSIS — E1165 Type 2 diabetes mellitus with hyperglycemia: Secondary | ICD-10-CM | POA: Diagnosis not present

## 2019-01-30 DIAGNOSIS — J449 Chronic obstructive pulmonary disease, unspecified: Secondary | ICD-10-CM | POA: Diagnosis not present

## 2019-02-17 DIAGNOSIS — E1165 Type 2 diabetes mellitus with hyperglycemia: Secondary | ICD-10-CM | POA: Diagnosis not present

## 2019-02-17 DIAGNOSIS — I10 Essential (primary) hypertension: Secondary | ICD-10-CM | POA: Diagnosis not present

## 2019-02-17 DIAGNOSIS — E781 Pure hyperglyceridemia: Secondary | ICD-10-CM | POA: Diagnosis not present

## 2019-03-02 DIAGNOSIS — J449 Chronic obstructive pulmonary disease, unspecified: Secondary | ICD-10-CM | POA: Diagnosis not present

## 2019-03-23 DIAGNOSIS — R0602 Shortness of breath: Secondary | ICD-10-CM | POA: Diagnosis not present

## 2019-03-23 DIAGNOSIS — Z23 Encounter for immunization: Secondary | ICD-10-CM | POA: Diagnosis not present

## 2019-03-23 DIAGNOSIS — J439 Emphysema, unspecified: Secondary | ICD-10-CM | POA: Diagnosis not present

## 2019-03-25 DIAGNOSIS — H2513 Age-related nuclear cataract, bilateral: Secondary | ICD-10-CM | POA: Diagnosis not present

## 2019-03-31 DIAGNOSIS — I10 Essential (primary) hypertension: Secondary | ICD-10-CM | POA: Diagnosis not present

## 2019-03-31 DIAGNOSIS — H2511 Age-related nuclear cataract, right eye: Secondary | ICD-10-CM | POA: Diagnosis not present

## 2019-04-01 ENCOUNTER — Encounter: Payer: Self-pay | Admitting: *Deleted

## 2019-04-01 ENCOUNTER — Other Ambulatory Visit: Payer: Self-pay

## 2019-04-01 DIAGNOSIS — J449 Chronic obstructive pulmonary disease, unspecified: Secondary | ICD-10-CM | POA: Diagnosis not present

## 2019-04-02 NOTE — Discharge Instructions (Signed)

## 2019-04-03 ENCOUNTER — Other Ambulatory Visit: Payer: Self-pay

## 2019-04-03 ENCOUNTER — Other Ambulatory Visit
Admission: RE | Admit: 2019-04-03 | Discharge: 2019-04-03 | Disposition: A | Discharge status: Home / Self Care | Payer: 59 | Source: Ambulatory Visit | Attending: Ophthalmology | Admitting: Ophthalmology

## 2019-04-03 DIAGNOSIS — Z1159 Encounter for screening for other viral diseases: Secondary | ICD-10-CM | POA: Insufficient documentation

## 2019-04-04 LAB — NOVEL CORONAVIRUS, NAA (HOSP ORDER, SEND-OUT TO REF LAB; TAT 18-24 HRS): SARS-CoV-2, NAA: NOT DETECTED

## 2019-04-07 ENCOUNTER — Encounter
Admission: RE | Disposition: A | Discharge status: Home / Self Care | Payer: Self-pay | Source: Home / Self Care | Attending: Ophthalmology

## 2019-04-07 ENCOUNTER — Other Ambulatory Visit: Payer: Self-pay

## 2019-04-07 ENCOUNTER — Ambulatory Visit
Admission: RE | Admit: 2019-04-07 | Discharge: 2019-04-07 | Disposition: A | Discharge status: Home / Self Care | Payer: 59 | Attending: Ophthalmology | Admitting: Ophthalmology

## 2019-04-07 ENCOUNTER — Ambulatory Visit: Payer: 59 | Admitting: Anesthesiology

## 2019-04-07 DIAGNOSIS — Z79899 Other long term (current) drug therapy: Secondary | ICD-10-CM | POA: Diagnosis not present

## 2019-04-07 DIAGNOSIS — Z7984 Long term (current) use of oral hypoglycemic drugs: Secondary | ICD-10-CM | POA: Insufficient documentation

## 2019-04-07 DIAGNOSIS — J449 Chronic obstructive pulmonary disease, unspecified: Secondary | ICD-10-CM | POA: Insufficient documentation

## 2019-04-07 DIAGNOSIS — E1140 Type 2 diabetes mellitus with diabetic neuropathy, unspecified: Secondary | ICD-10-CM | POA: Diagnosis not present

## 2019-04-07 DIAGNOSIS — E1136 Type 2 diabetes mellitus with diabetic cataract: Secondary | ICD-10-CM | POA: Insufficient documentation

## 2019-04-07 DIAGNOSIS — H2511 Age-related nuclear cataract, right eye: Secondary | ICD-10-CM | POA: Diagnosis not present

## 2019-04-07 DIAGNOSIS — Z87891 Personal history of nicotine dependence: Secondary | ICD-10-CM | POA: Diagnosis not present

## 2019-04-07 DIAGNOSIS — Z7982 Long term (current) use of aspirin: Secondary | ICD-10-CM | POA: Insufficient documentation

## 2019-04-07 DIAGNOSIS — I10 Essential (primary) hypertension: Secondary | ICD-10-CM | POA: Insufficient documentation

## 2019-04-07 HISTORY — DX: Chronic obstructive pulmonary disease, unspecified: J44.9

## 2019-04-07 HISTORY — DX: Anesthesia of skin: R20.0

## 2019-04-07 HISTORY — DX: Unspecified osteoarthritis, unspecified site: M19.90

## 2019-04-07 HISTORY — PX: CATARACT EXTRACTION W/PHACO: SHX586

## 2019-04-07 LAB — GLUCOSE, CAPILLARY
Glucose-Capillary: 141 mg/dL — ABNORMAL HIGH (ref 70–99)
Glucose-Capillary: 140 mg/dL — ABNORMAL HIGH (ref 70–99)

## 2019-04-07 SURGERY — PHACOEMULSIFICATION, CATARACT, WITH IOL INSERTION
Anesthesia: Monitor Anesthesia Care | Site: Eye | Laterality: Right

## 2019-04-07 MED ORDER — EPINEPHRINE PF 1 MG/ML IJ SOLN
INTRAOCULAR | Status: DC | PRN
  Administered 2019-04-07: 11:00:00 via OPHTHALMIC

## 2019-04-07 MED ORDER — FENTANYL CITRATE (PF) 100 MCG/2ML IJ SOLN
INTRAMUSCULAR | Status: DC | PRN
  Administered 2019-04-07: 50 ug via INTRAVENOUS

## 2019-04-07 MED ORDER — BRIMONIDINE TARTRATE-TIMOLOL 0.2-0.5 % OP SOLN
OPHTHALMIC | Status: DC | PRN
  Administered 2019-04-07: 1 [drp] via OPHTHALMIC

## 2019-04-07 MED ORDER — LACTATED RINGERS IV SOLN: 10.0000 mL/h | INTRAVENOUS | Status: DC

## 2019-04-07 MED ORDER — ARMC OPHTHALMIC DILATING DROPS
1.0000 "application " | OPHTHALMIC | Status: DC | PRN
  Administered 2019-04-07 (×3): 1 via OPHTHALMIC

## 2019-04-07 MED ORDER — MOXIFLOXACIN HCL 0.5 % OP SOLN
OPHTHALMIC | Status: DC | PRN
  Administered 2019-04-07: 0.2 mL via OPHTHALMIC

## 2019-04-07 MED ORDER — MIDAZOLAM HCL 2 MG/2ML IJ SOLN
INTRAMUSCULAR | Status: DC | PRN
  Administered 2019-04-07: 2 mg via INTRAVENOUS

## 2019-04-07 MED ORDER — NA CHONDROIT SULF-NA HYALURON 40-17 MG/ML IO SOLN
INTRAOCULAR | Status: DC | PRN
  Administered 2019-04-07: 1 mL via INTRAOCULAR

## 2019-04-07 MED ORDER — LIDOCAINE HCL (PF) 2 % IJ SOLN
INTRAOCULAR | Status: DC | PRN
  Administered 2019-04-07: 1 mL

## 2019-04-07 MED ORDER — TETRACAINE HCL 0.5 % OP SOLN
1.0000 [drp] | OPHTHALMIC | Status: DC | PRN
  Administered 2019-04-07 (×3): 1 [drp] via OPHTHALMIC

## 2019-04-07 SURGICAL SUPPLY — 19 items
CANNULA ANT/CHMB 27GA (MISCELLANEOUS) ×2 IMPLANT
GLOVE SURG LX 8.0 MICRO (GLOVE) ×2
GLOVE SURG LX STRL 8.0 MICRO (GLOVE) ×2 IMPLANT
GLOVE SURG TRIUMPH 8.0 PF LTX (GLOVE) ×2 IMPLANT
GOWN STRL REUS W/ TWL LRG LVL3 (GOWN DISPOSABLE) ×2 IMPLANT
GOWN STRL REUS W/TWL LRG LVL3 (GOWN DISPOSABLE) ×2
LENS IOL TECNIS ITEC 20.0 (Intraocular Lens) ×2 IMPLANT
MARKER SKIN DUAL TIP RULER LAB (MISCELLANEOUS) ×2 IMPLANT
NDL RETROBULBAR .5 NSTRL (NEEDLE) ×2 IMPLANT
NEEDLE FILTER BLUNT 18X 1/2SAF (NEEDLE) ×1
NEEDLE FILTER BLUNT 18X1 1/2 (NEEDLE) ×1 IMPLANT
PACK CATARACT BRASINGTON (MISCELLANEOUS) ×2 IMPLANT
PACK EYE AFTER SURG (MISCELLANEOUS) ×2 IMPLANT
PACK OPTHALMIC (MISCELLANEOUS) ×2 IMPLANT
SUT ETHILON 10-0 CS-B-6CS-B-6 (SUTURE)
SUTURE EHLN 10-0 CS-B-6CS-B-6 (SUTURE) IMPLANT
SYR 3ML LL SCALE MARK (SYRINGE) ×2 IMPLANT
SYR TB 1ML LUER SLIP (SYRINGE) ×2 IMPLANT
WIPE NON LINTING 3.25X3.25 (MISCELLANEOUS) ×2 IMPLANT

## 2019-04-07 NOTE — Anesthesia Procedure Notes (Signed)
Procedure Name: MAC Date/Time: 04/07/2019 11:09 AM Performed by: Cameron Ali, CRNA Pre-anesthesia Checklist: Patient identified, Emergency Drugs available, Suction available, Timeout performed and Patient being monitored Patient Re-evaluated:Patient Re-evaluated prior to induction Oxygen Delivery Method: Nasal cannula Placement Confirmation: positive ETCO2

## 2019-04-07 NOTE — H&P (Signed)
All labs reviewed. Abnormal studies sent to patients PCP when indicated.  Previous H&P reviewed, patient examined, there are NO CHANGES.  Andrew Dominguez Porfilio6/2/202010:56 AM

## 2019-04-07 NOTE — Op Note (Signed)
PREOPERATIVE DIAGNOSIS:  Nuclear sclerotic cataract of the right eye.   POSTOPERATIVE DIAGNOSIS:  CATARACT   OPERATIVE PROCEDURE: Procedure(s): CATARACT EXTRACTION PHACO AND INTRAOCULAR LENS PLACEMENT (IOC)  RIGHT DIABETIC   SURGEON:  Birder Robson, MD.   ANESTHESIA:  Anesthesiologist: Marice Potter, MD CRNA: Cameron Ali, CRNA  1.      Managed anesthesia care. 2.      0.48ml of Shugarcaine was instilled in the eye following the paracentesis.   COMPLICATIONS:  None.   TECHNIQUE:   Stop and chop   DESCRIPTION OF PROCEDURE:  The patient was examined and consented in the preoperative holding area where the aforementioned topical anesthesia was applied to the right eye and then brought back to the Operating Room where the right eye was prepped and draped in the usual sterile ophthalmic fashion and a lid speculum was placed. A paracentesis was created with the side port blade and the anterior chamber was filled with viscoelastic. A near clear corneal incision was performed with the steel keratome. A continuous curvilinear capsulorrhexis was performed with a cystotome followed by the capsulorrhexis forceps. Hydrodissection and hydrodelineation were carried out with BSS on a blunt cannula. The lens was removed in a stop and chop  technique and the remaining cortical material was removed with the irrigation-aspiration handpiece. The capsular bag was inflated with viscoelastic and the Technis [id number]  lens was placed in the capsular bag without complication. The remaining viscoelastic was removed from the eye with the irrigation-aspiration handpiece. The wounds were hydrated. The anterior chamber was flushed with BSS and the eye was inflated to physiologic pressure. 0.54ml of Vigamox was placed in the anterior chamber. The wounds were found to be water tight. The eye was dressed with Combigan. The patient was given protective glasses to wear throughout the day and a shield with which to sleep  tonight. The patient was also given drops with which to begin a drop regimen today and will follow-up with me in one day. Implant Name Type Inv. Item Serial No. Manufacturer Lot No. LRB No. Used  TECNIS 1 PIECE IOL [id number] Intraocular Lens  [id number] Wynetta Emery [employer] J[employer]ON  Right 1   Procedure(s) with comments: CATARACT EXTRACTION PHACO AND INTRAOCULAR LENS PLACEMENT (IOC)  RIGHT DIABETIC (Right) - Diabetic - oral meds  Electronically signed: Birder Robson 04/07/2019 11:30 AM

## 2019-04-07 NOTE — Anesthesia Postprocedure Evaluation (Signed)
Anesthesia Post Note  Patient: Andrew Dominguez  Procedure(s) Performed: CATARACT EXTRACTION PHACO AND INTRAOCULAR LENS PLACEMENT (IOC)  RIGHT DIABETIC (Right Eye)  Patient location during evaluation: PACU Anesthesia Type: MAC Level of consciousness: awake and alert Pain management: pain level controlled Vital Signs Assessment: post-procedure vital signs reviewed and stable Respiratory status: spontaneous breathing, nonlabored ventilation, respiratory function stable and patient connected to nasal cannula oxygen Cardiovascular status: stable and blood pressure returned to baseline Postop Assessment: no apparent nausea or vomiting Anesthetic complications: no    SCOURAS, NICOLE ELAINE

## 2019-04-07 NOTE — Anesthesia Preprocedure Evaluation (Signed)
Anesthesia Evaluation  Patient identified by MRN, date of birth, ID band Patient awake    Reviewed: Allergy & Precautions, H&P , NPO status , Patient's Chart, lab work & pertinent test results, reviewed documented beta blocker date and time   Airway Mallampati: II  TM Distance: >3 FB Neck ROM: full    Dental no notable dental hx.    Pulmonary COPD,  COPD inhaler, former smoker,    Pulmonary exam normal breath sounds clear to auscultation       Cardiovascular Exercise Tolerance: Good hypertension,  Rhythm:regular Rate:Normal     Neuro/Psych negative neurological ROS  negative psych ROS   GI/Hepatic negative GI ROS, Neg liver ROS,   Endo/Other  diabetes  Renal/GU negative Renal ROS  negative genitourinary   Musculoskeletal   Abdominal   Peds  Hematology negative hematology ROS (+)   Anesthesia Other Findings BMI 46  Reproductive/Obstetrics negative OB ROS                             Anesthesia Physical Anesthesia Plan  ASA: II  Anesthesia Plan: MAC   Post-op Pain Management:    Induction:   PONV Risk Score and Plan:   Airway Management Planned:   Additional Equipment:   Intra-op Plan:   Post-operative Plan:   Informed Consent: I have reviewed the patients History and Physical, chart, labs and discussed the procedure including the risks, benefits and alternatives for the proposed anesthesia with the patient or authorized representative who has indicated his/her understanding and acceptance.     Dental Advisory Given  Plan Discussed with: CRNA  Anesthesia Plan Comments:         Anesthesia Quick Evaluation

## 2019-04-07 NOTE — Transfer of Care (Signed)
Immediate Anesthesia Transfer of Care Note  Patient: Andrew Dominguez  Procedure(s) Performed: CATARACT EXTRACTION PHACO AND INTRAOCULAR LENS PLACEMENT (IOC)  RIGHT DIABETIC (Right Eye)  Patient Location: PACU  Anesthesia Type: MAC  Level of Consciousness: awake, alert  and patient cooperative  Airway and Oxygen Therapy: Patient Spontanous Breathing and Patient connected to supplemental oxygen  Post-op Assessment: Post-op Vital signs reviewed, Patient's Cardiovascular Status Stable, Respiratory Function Stable, Patent Airway and No signs of Nausea or vomiting  Post-op Vital Signs: Reviewed and stable  Complications: No apparent anesthesia complications

## 2019-04-08 ENCOUNTER — Encounter: Payer: Self-pay | Admitting: Ophthalmology

## 2020-06-16 ENCOUNTER — Other Ambulatory Visit: Payer: Self-pay | Admitting: Internal Medicine

## 2020-06-16 DIAGNOSIS — R55 Syncope and collapse: Secondary | ICD-10-CM

## 2020-06-16 DIAGNOSIS — I25118 Atherosclerotic heart disease of native coronary artery with other forms of angina pectoris: Secondary | ICD-10-CM

## 2020-06-28 ENCOUNTER — Ambulatory Visit
Admission: RE | Admit: 2020-06-28 | Discharge: 2020-06-28 | Disposition: A | Discharge status: Home / Self Care | Payer: 59 | Source: Ambulatory Visit | Attending: Internal Medicine | Admitting: Internal Medicine

## 2020-06-28 ENCOUNTER — Other Ambulatory Visit: Payer: Self-pay

## 2020-06-28 DIAGNOSIS — I25118 Atherosclerotic heart disease of native coronary artery with other forms of angina pectoris: Secondary | ICD-10-CM | POA: Insufficient documentation

## 2020-06-28 DIAGNOSIS — R55 Syncope and collapse: Secondary | ICD-10-CM | POA: Insufficient documentation

## 2020-06-28 LAB — NM MYOCAR MULTI W/SPECT W/WALL MOTION / EF
Estimated workload: 1 METS
Exercise duration (sec): 56 s
LV dias vol: 65 mL (ref 62–150)
LV sys vol: 20 mL
Peak HR: 131 {beats}/min
Percent HR: 83 %
Rest HR: 88 {beats}/min
SDS: 3
SRS: 3
SSS: 0
TID: 0.8

## 2020-06-28 MED ORDER — TECHNETIUM TC 99M TETROFOSMIN IV KIT
10.0000 | PACK | Freq: Once | INTRAVENOUS | Status: AC | PRN
  Administered 2020-06-28: 10.78 via INTRAVENOUS

## 2020-06-28 MED ORDER — REGADENOSON 0.4 MG/5ML IV SOLN
0.4000 mg | Freq: Once | INTRAVENOUS | Status: AC
  Administered 2020-06-28: 0.4 mg via INTRAVENOUS
  Filled 2020-06-28: qty 5

## 2020-06-28 MED ORDER — TECHNETIUM TC 99M TETROFOSMIN IV KIT
29.8800 | PACK | Freq: Once | INTRAVENOUS | Status: AC | PRN
  Administered 2020-06-28: 29.88 via INTRAVENOUS

## 2020-06-29 ENCOUNTER — Ambulatory Visit: Payer: 59

## 2020-08-03 ENCOUNTER — Other Ambulatory Visit: Payer: Self-pay | Admitting: Orthopedic Surgery

## 2020-08-03 DIAGNOSIS — M1712 Unilateral primary osteoarthritis, left knee: Secondary | ICD-10-CM

## 2020-08-18 ENCOUNTER — Ambulatory Visit
Admission: RE | Admit: 2020-08-18 | Discharge: 2020-08-18 | Disposition: A | Discharge status: Home / Self Care | Payer: 59 | Source: Ambulatory Visit | Attending: Orthopedic Surgery | Admitting: Orthopedic Surgery

## 2020-08-18 ENCOUNTER — Other Ambulatory Visit: Payer: Self-pay

## 2020-08-18 DIAGNOSIS — M1712 Unilateral primary osteoarthritis, left knee: Secondary | ICD-10-CM | POA: Diagnosis not present

## 2020-08-19 ENCOUNTER — Other Ambulatory Visit: Payer: Self-pay | Admitting: Orthopedic Surgery

## 2020-08-19 DIAGNOSIS — C4022 Malignant neoplasm of long bones of left lower limb: Secondary | ICD-10-CM

## 2020-08-20 ENCOUNTER — Other Ambulatory Visit: Payer: Self-pay

## 2020-08-20 ENCOUNTER — Ambulatory Visit
Admission: RE | Admit: 2020-08-20 | Discharge: 2020-08-20 | Disposition: A | Discharge status: Home / Self Care | Payer: 59 | Source: Ambulatory Visit | Attending: Orthopedic Surgery | Admitting: Orthopedic Surgery

## 2020-08-20 DIAGNOSIS — C4022 Malignant neoplasm of long bones of left lower limb: Secondary | ICD-10-CM | POA: Insufficient documentation

## 2020-08-20 MED ORDER — GADOBUTROL 1 MMOL/ML IV SOLN
10.0000 mL | Freq: Once | INTRAVENOUS | Status: AC | PRN
  Administered 2020-08-20: 10 mL via INTRAVENOUS

## 2020-08-22 NOTE — Progress Notes (Signed)
[facility]  [address], [address] [place], [place] [address] Phone: [phone]  Fax: [phone]   Clinic Day:  08/23/2020  Referring physician: Hessie Knows, MD  Chief Complaint: Andrew Dominguez is a 64 y.o. male with lytic bone lesions who is referred in consultation by Dr. Hessie Knows for assessment and management.  HPI:  The patient saw Dr. Rudene Christians on 08/03/2020 to discuss total left knee arthroplasty. His knee had been bothering him for 4 years. Left knee films on 07/07/2020 revealed severe tricompartmental osteoarthritis with marked varus deformity, lateral subluxation of the tibia, severe patellofemoral and medial compartment arthritis, as well as advanced lateral compartment arthritis, some bone erosion of the femoral and tibial condyles.  Subsequent imaging studies revealed lytic bone lesions.  A walker was recommended to prevent fracture.  Left knee CT without contrast on 08/18/2020 revealed tricompartmental osteoarthritis of the left knee. There was a 3.9 x 3.7 x 7.2 cm lytic lesion in the proximal left femoral diaphysis with severe cortical thinning and small areas of cortical destruction most concerning for malignancy (metastatic disease vs multiple myeloma).  Left hip MRI with and without contrast on 08/20/2020 revealed multiple aggressive appearing lesions in both proximal femurs and the left posterior iliac bone, c/w metastatic disease or multiple myeloma. The left proximal femur was at risk for pathologic fracture.  CMP on 04/12/2020 revealed a creatinine of 1.1, calcium of 9.5, total protein 6.7, and albumen 4.0.  Chest CT angiogram on 02/14/2018 revealed no pulmonary embolism.  There were questionable tiny left lung lesions at the major fissure, largest 4 mm.  Symptomatically, he has been "fine." He lost about 40 lbs in the past 4-5 months because his appetite has been decreased. He reports shortness of breath on exertion. He has had on and off  loose stools for the past couple of weeks. He has neuropathy in his feet. He has been using a walker at home.   The patient denies fevers, sweats, headaches, changes in vision, runny nose, sore throat, cough, chest pain, palpitations, nausea, vomiting, reflux, urinary symptoms, skin changes, weakness, balance or coordination problems, and bleeding of any kind.  The patient has COPD. He quit smoking 18 years go. He smoked 2-2.5 packs per day for about 30 years. He has diabetic neuropathy in his feet. He is due for a colonoscopy this year (last was 05/16/2015). He has had polyps before.  The patient's father was a smoker and had lung cancer.  To his knowledge, the patient is not allergic to contrast.   Past Medical History:  Diagnosis Date  . Anemia   . Arthritis    knees  . COPD (chronic obstructive pulmonary disease) ([place])   . Diabetes mellitus without complication ([place])   . Hyperlipemia   . Hypertension   . Hypertriglyceridemia   . Hypogonadism in male   . Numbness of foot    bilateral  . Obesity   . Tubular adenoma of colon   . Vitamin D deficiency     Past Surgical History:  Procedure Laterality Date  . CATARACT EXTRACTION W/PHACO Right 04/07/2019   Procedure: CATARACT EXTRACTION PHACO AND INTRAOCULAR LENS PLACEMENT ([place])  RIGHT DIABETIC;  Surgeon: Birder Robson, MD;  Location: [facility];  Service: Ophthalmology;  Laterality: Right;  Diabetic - oral meds  . COLONOSCOPY    . COLONOSCOPY WITH PROPOFOL N/A 05/16/2015   Procedure: COLONOSCOPY WITH PROPOFOL;  Surgeon: Manya Silvas, MD;  Location: [facility] ENDOSCOPY;  Service: Endoscopy;  Laterality:  N/A;  . ORIF FINGER / THUMB FRACTURE Left    thumb, 1970s    Family History  Problem Relation Age of Onset  . Lung cancer Father   . Prostate cancer Neg Hx   . Bladder Cancer Neg Hx   . Kidney cancer Neg Hx     Social History:  reports that he quit smoking about 21 years ago. He has never used smokeless tobacco. He  reports current alcohol use of about 14.0 standard drinks of alcohol per week. He reports that he does not use drugs.  He has a 60-75 pack year smoking history.  He quit smoking 18 years go. He smoked 2-2.5 packs per day for about 30 years. Before 04/2020, he drank 2 "good stiff drinks" every night. He drank 4 beers this past weekend, but other than that, he has not drank since 04/2020. 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 The patient is accompanied by his significant other, Andrew Dominguez, today.  Allergies:  Allergies  Allergen Reactions  . Sulfa Antibiotics Rash    Current Medications: Current Outpatient Medications  Medication Sig Dispense Refill  . albuterol (VENTOLIN HFA) 108 (90 Base) MCG/ACT inhaler Inhale into the lungs every 6 (six) hours as needed for wheezing or shortness of breath.    Marland Kitchen amLODipine (NORVASC) 10 MG tablet Take 10 mg by mouth daily.    Marland Kitchen atenolol (TENORMIN) 50 MG tablet Take 50 mg by mouth daily.    . Blood Glucose Monitoring Suppl (GLUCOCOM BLOOD GLUCOSE MONITOR) DEVI 1 each by XX route as directed.    Marland Kitchen ELIQUIS 5 MG TABS tablet Take 5 mg by mouth 2 (two) times daily.    . empagliflozin (JARDIANCE) 25 MG TABS tablet Take by mouth.    . fenofibrate 54 MG tablet Take 54 mg by mouth daily.    . Furosemide (LASIX PO) Take by mouth daily.    . furosemide (LASIX) 20 MG tablet Take 1 tablet by mouth daily.    Marland Kitchen gabapentin (NEURONTIN) 100 MG capsule Take 100 mg by mouth 3 (three) times daily.    Marland Kitchen glucose blood (ONETOUCH ULTRA) test strip Use 3 (three) times daily E11.29    . hydrochlorothiazide (HYDRODIURIL) 25 MG tablet Take 25 mg by mouth daily.    . Insulin Pen Needle (B-D UF III MINI PEN NEEDLES) 31G X 5 MM MISC Use once daily E11.29    . JARDIANCE 10 MG TABS tablet     . losartan (COZAAR) 100 MG tablet Take 100 mg by mouth daily.    . metFORMIN (GLUCOPHAGE) 1000 MG tablet Take 1,000 mg by mouth 2 (two) times daily  with a meal.    . rosuvastatin (CRESTOR) 5 MG tablet Take 5 mg by mouth daily.    Marland Kitchen Umeclidinium-Vilanterol (ANORO ELLIPTA IN) Inhale into the lungs daily.    Marland Kitchen VICTOZA 18 MG/3ML SOPN     . aspirin EC 81 MG tablet Take 81 mg by mouth daily. (Patient not taking: Reported on 08/23/2020)    . B Complex Vitamins (VITAMIN B COMPLEX PO) Take by mouth daily. (Patient not taking: Reported on 08/23/2020)     No current facility-administered medications for this visit.    Review of Systems  Constitutional: Positive for weight loss (40 lbs in past 4-5 months). Negative for chills, diaphoresis, fever and malaise/fatigue.       Feels "fine."  HENT: Negative for congestion, ear discharge, ear pain, hearing  loss, nosebleeds, sinus pain, sore throat and tinnitus.   Eyes: Negative for blurred vision.  Respiratory: Positive for shortness of breath (on exertion). Negative for cough, hemoptysis and sputum production.        COPD  Cardiovascular: Negative for chest pain, palpitations and leg swelling.  Gastrointestinal: Negative for abdominal pain, blood in stool, constipation, diarrhea (loose stools, on and off, x 2 weeks), heartburn, melena, nausea and vomiting.       Poor appetite.  Genitourinary: Negative for dysuria, frequency, hematuria and urgency.  Musculoskeletal: Positive for joint pain (left knee). Negative for back pain, myalgias and neck pain.  Skin: Negative for itching and rash.  Neurological: Positive for sensory change (neuropathy in feet). Negative for dizziness, tingling, weakness and headaches.  Endo/Heme/Allergies: Does not bruise/bleed easily.       Diabetes  Psychiatric/Behavioral: Negative for depression and memory loss. The patient is not nervous/anxious and does not have insomnia.   All other systems reviewed and are negative.  Performance status (ECOG): 2  Vitals Blood pressure 108/72, pulse (!) 109, temperature 99.4 F (37.4 C), temperature source Tympanic, resp. rate 18,  height 6' (1.829 m), weight 296 lb 9.6 oz (134.5 kg), SpO2 97 %.   Physical Exam Vitals and nursing note reviewed.  Constitutional:      General: He is not in acute distress.    Appearance: He is not diaphoretic.     Comments: Patient sitting comfortably in a wheelchair in no acute distress. He was examined in the wheelchair.  HENT:     Head: Normocephalic and atraumatic.     Comments: Gray hair and beard.    Mouth/Throat:     Mouth: Mucous membranes are moist.     Pharynx: Oropharynx is clear.  Eyes:     General: No scleral icterus.    Extraocular Movements: Extraocular movements intact.     Conjunctiva/sclera: Conjunctivae normal.     Pupils: Pupils are equal, round, and reactive to light.  Cardiovascular:     Rate and Rhythm: Normal rate and regular rhythm.     Heart sounds: Normal heart sounds. No murmur heard.   Pulmonary:     Effort: Pulmonary effort is normal. No respiratory distress.     Breath sounds: Normal breath sounds. No wheezing or rales.  Chest:     Chest wall: No tenderness.  Abdominal:     General: Bowel sounds are normal. There is no distension.     Palpations: Abdomen is soft. There is no mass.     Tenderness: There is no abdominal tenderness. There is no guarding or rebound.     Comments: Fully round with no appreciable hepatosplenomegaly.  Musculoskeletal:        General: No tenderness. Normal range of motion.     Comments: Chronic swelling in BLE  Lymphadenopathy:     Head:     Right side of head: No preauricular, posterior auricular or occipital adenopathy.     Left side of head: No preauricular, posterior auricular or occipital adenopathy.     Cervical: No cervical adenopathy.     Upper Body:     Right upper body: No supraclavicular or axillary adenopathy.     Left upper body: No supraclavicular or axillary adenopathy.     Lower Body: No right inguinal adenopathy. No left inguinal adenopathy.  Skin:    General: Skin is warm and dry.      Comments: Tattoos.  Neurological:     Mental Status: He is alert and  oriented to person, place, and time.  Psychiatric:        Behavior: Behavior normal.        Thought Content: Thought content normal.        Judgment: Judgment normal.    No visits with results within 3 Day(s) from this visit.  Latest known visit with results is:  Hospital Outpatient Visit on 06/28/2020  Component Date Value Ref Range Status  . Rest HR 06/28/2020 88  bpm Final  . Rest BP 06/28/2020 135/71  mmHg Final  . Exercise duration (sec) 06/28/2020 56  sec Final  . Percent HR 06/28/2020 83  % Final  . Estimated workload 06/28/2020 1.0  METS Final  . Peak HR 06/28/2020 131  bpm Final  . Peak BP 06/28/2020 143/69  mmHg Final  . SSS 06/28/2020 0   Final  . SRS 06/28/2020 3   Final  . SDS 06/28/2020 3   Final  . TID 06/28/2020 0.80   Final  . LV sys vol 06/28/2020 20  mL Final  . LV dias vol 06/28/2020 65  62 - 150 mL Final    Assessment:  LYNFORD ESPINOZA is a 64 y.o. male with lytic bone lesions.  Labs on 04/12/2020 revealed a creatinine of 1.1, calcium of 9.5, total protein 6.7, and albumen 4.0.  LFTs were normal.  Left knee CT without contrast on 08/18/2020 revealed tricompartmental osteoarthritis of the left knee. There was a 3.9 x 3.7 x 7.2 cm lytic lesion in the proximal left femoral diaphysis with severe cortical thinning and small areas of cortical destruction most concerning for malignancy (metastatic disease vs multiple myeloma).  Left hip MRI with and without contrast on 08/20/2020 revealed multiple aggressive appearing lesions in both proximal femurs and the left posterior iliac bone c/w metastatic disease or multiple myeloma. The left proximal femur was at risk for pathologic fracture.  He has a 60-75 pack year smoking history.  He is due for a colonoscopy this year (last was 05/16/2015). He has a history of polyps.  Symptomatically, he notes a 40 pound weight loss in the past 4-5 months. He has  shortness of breath on exertion. He has had on and off loose stools for the past couple of weeks.   Plan: 1.   Labs today:  CBC with diff, CMP, CEA, LDH, myeloma panel, FLCA. 2.   24 hour urine for UPEP and FLC. 3.   Lytic bone lesions   Patient has lost a significant amount of weight.  He has a significant smoking history.  Differential diagnosis: multiple myeloma, renal cell carcinoma, lung cancer, melanoma, thyroid cancer, lymphoma.  Discuss screening labs for myeloma   Patient has normal renal function and calcium.   He has anemia and lytic bone lesions.  Discuss chest, abdomen, and pelvis CT.  Discuss bone scan with plain films of weight bearing lesions. 4.   Bone scan. 5.   Chest, abdomen, pelvis CT. 6.   RTC after above for MD assessment and discussion regarding direction of therapy.  I discussed the assessment and treatment plan with the patient.  The patient was provided an opportunity to ask questions and all were answered.  The patient agreed with the plan and demonstrated an understanding of the instructions.  The patient was advised to call back if the symptoms worsen or if the condition fails to improve as anticipated.   Keeanna Villafranca C. Mike Gip, MD, PhD    08/23/2020, 8:20 AM  I, Mirian Mo Tufford, am acting  as scribe for [place]. Mike Gip, MD, PhD.  I, Demetries Coia C. Mike Gip, MD, have reviewed the above documentation for accuracy and completeness, and I agree with the above.

## 2020-08-23 ENCOUNTER — Inpatient Hospital Stay: Payer: 59 | Attending: Hematology and Oncology | Admitting: Hematology and Oncology

## 2020-08-23 ENCOUNTER — Encounter: Payer: Self-pay | Admitting: Hematology and Oncology

## 2020-08-23 ENCOUNTER — Other Ambulatory Visit: Payer: Self-pay

## 2020-08-23 ENCOUNTER — Inpatient Hospital Stay: Payer: 59

## 2020-08-23 VITALS — BP 108/72 | HR 109 | Temp 99.4°F | Resp 18 | Ht 72.0 in | Wt 296.6 lb

## 2020-08-23 DIAGNOSIS — R634 Abnormal weight loss: Secondary | ICD-10-CM | POA: Insufficient documentation

## 2020-08-23 DIAGNOSIS — E1140 Type 2 diabetes mellitus with diabetic neuropathy, unspecified: Secondary | ICD-10-CM | POA: Diagnosis not present

## 2020-08-23 DIAGNOSIS — Z87891 Personal history of nicotine dependence: Secondary | ICD-10-CM | POA: Diagnosis not present

## 2020-08-23 DIAGNOSIS — J449 Chronic obstructive pulmonary disease, unspecified: Secondary | ICD-10-CM | POA: Insufficient documentation

## 2020-08-23 DIAGNOSIS — Z86010 Personal history of colonic polyps: Secondary | ICD-10-CM | POA: Insufficient documentation

## 2020-08-23 DIAGNOSIS — M899 Disorder of bone, unspecified: Secondary | ICD-10-CM | POA: Diagnosis not present

## 2020-08-23 DIAGNOSIS — Z801 Family history of malignant neoplasm of trachea, bronchus and lung: Secondary | ICD-10-CM | POA: Diagnosis not present

## 2020-08-23 DIAGNOSIS — Z993 Dependence on wheelchair: Secondary | ICD-10-CM | POA: Insufficient documentation

## 2020-08-23 DIAGNOSIS — Z794 Long term (current) use of insulin: Secondary | ICD-10-CM | POA: Insufficient documentation

## 2020-08-23 DIAGNOSIS — R194 Change in bowel habit: Secondary | ICD-10-CM | POA: Insufficient documentation

## 2020-08-23 LAB — COMPREHENSIVE METABOLIC PANEL
ALT: 10 U/L (ref 0–44)
AST: 14 U/L — ABNORMAL LOW (ref 15–41)
Albumin: 3.6 g/dL (ref 3.5–5.0)
Alkaline Phosphatase: 49 U/L (ref 38–126)
Anion gap: 12 (ref 5–15)
BUN: 17 mg/dL (ref 8–23)
CO2: 22 mmol/L (ref 22–32)
Calcium: 8.8 mg/dL — ABNORMAL LOW (ref 8.9–10.3)
Chloride: 102 mmol/L (ref 98–111)
Creatinine, Ser: 1.08 mg/dL (ref 0.61–1.24)
GFR, Estimated: >60 mL/min (ref >60)
Glucose, Bld: 140 mg/dL — ABNORMAL HIGH (ref 70–99)
Potassium: 4.2 mmol/L (ref 3.5–5.1)
Sodium: 136 mmol/L (ref 135–145)
Total Bilirubin: 0.6 mg/dL (ref 0.3–1.2)
Total Protein: 6.8 g/dL (ref 6.5–8.1)

## 2020-08-23 LAB — CBC WITH DIFFERENTIAL/PLATELET
Abs Immature Granulocytes: 0.03 10*3/uL (ref 0.00–0.07)
Basophils Absolute: 0 10*3/uL (ref 0.0–0.1)
Basophils Relative: 0 %
Eosinophils Absolute: 0.1 10*3/uL (ref 0.0–0.5)
Eosinophils Relative: 1 %
HCT: 34.2 % — ABNORMAL LOW (ref 39.0–52.0)
Hemoglobin: 10.9 g/dL — ABNORMAL LOW (ref 13.0–17.0)
Immature Granulocytes: 1 %
Lymphocytes Relative: 13 %
Lymphs Abs: 0.7 10*3/uL (ref 0.7–4.0)
MCH: 26.3 pg (ref 26.0–34.0)
MCHC: 31.9 g/dL (ref 30.0–36.0)
MCV: 82.6 fL (ref 80.0–100.0)
Monocytes Absolute: 0.5 10*3/uL (ref 0.1–1.0)
Monocytes Relative: 10 %
Neutro Abs: 4 10*3/uL (ref 1.7–7.7)
Neutrophils Relative %: 75 %
Platelets: 179 10*3/uL (ref 150–400)
RBC: 4.14 MIL/uL — ABNORMAL LOW (ref 4.22–5.81)
RDW: 14.3 % (ref 11.5–15.5)
WBC: 5.4 10*3/uL (ref 4.0–10.5)
nRBC: 0 % (ref 0.0–0.2)

## 2020-08-23 LAB — LACTATE DEHYDROGENASE: LDH: 244 U/L — ABNORMAL HIGH (ref 98–192)

## 2020-08-24 LAB — KAPPA/LAMBDA LIGHT CHAINS
Kappa free light chain: 31.3 mg/L — ABNORMAL HIGH (ref 3.3–19.4)
Kappa, lambda light chain ratio: 1.29 (ref 0.26–1.65)
Lambda free light chains: 24.2 mg/L (ref 5.7–26.3)

## 2020-08-24 LAB — CEA: CEA: 1.4 ng/mL (ref 0.0–4.7)

## 2020-08-25 LAB — MULTIPLE MYELOMA PANEL, SERUM
Albumin SerPl Elph-Mcnc: 3.2 g/dL (ref 2.9–4.4)
Albumin/Glob SerPl: 1.2 (ref 0.7–1.7)
Alpha 1: 0.3 g/dL (ref 0.0–0.4)
Alpha2 Glob SerPl Elph-Mcnc: 0.8 g/dL (ref 0.4–1.0)
B-Globulin SerPl Elph-Mcnc: 1 g/dL (ref 0.7–1.3)
Gamma Glob SerPl Elph-Mcnc: 0.7 g/dL (ref 0.4–1.8)
Globulin, Total: 2.8 g/dL (ref 2.2–3.9)
IgA: 209 mg/dL (ref 61–437)
IgG (Immunoglobin G), Serum: 890 mg/dL (ref 603–1613)
IgM (Immunoglobulin M), Srm: 55 mg/dL (ref 20–172)
Total Protein ELP: 6 g/dL (ref 6.0–8.5)

## 2020-09-01 ENCOUNTER — Ambulatory Visit
Admission: RE | Admit: 2020-09-01 | Discharge: 2020-09-01 | Disposition: A | Discharge status: Home / Self Care | Payer: 59 | Source: Ambulatory Visit | Attending: Hematology and Oncology | Admitting: Hematology and Oncology

## 2020-09-01 ENCOUNTER — Other Ambulatory Visit: Payer: Self-pay

## 2020-09-01 ENCOUNTER — Ambulatory Visit: Admission: RE | Admit: 2020-09-01 | Payer: 59 | Source: Ambulatory Visit

## 2020-09-01 ENCOUNTER — Telehealth: Payer: Self-pay

## 2020-09-01 DIAGNOSIS — M899 Disorder of bone, unspecified: Secondary | ICD-10-CM | POA: Diagnosis not present

## 2020-09-01 MED ORDER — IOHEXOL 300 MG/ML  SOLN
150.0000 mL | Freq: Once | INTRAMUSCULAR | Status: AC | PRN
  Administered 2020-09-01: 125 mL via INTRAVENOUS

## 2020-09-01 NOTE — Telephone Encounter (Signed)
I have tryed to reach the patient again and no answer / Left a message

## 2020-09-01 NOTE — Telephone Encounter (Signed)
I have reached  out to the patient twice and didn't get a answer but i was able to leave a message for the patient to return my phone call.

## 2020-09-07 ENCOUNTER — Telehealth: Payer: Self-pay

## 2020-09-07 NOTE — Telephone Encounter (Signed)
I have reached out to the patient several times and still have not gotta a call back or was not able to speak with him. I have called all numbers listed on his chart and still no success. I was able to speak with the patient he was agreeable to get his house sprayed for bed begs. i have sent a message to Ms [place] who will direct the process of this matter. At this time I have explained to the patient that his scans will be canceled at this time until this matter is resolved.

## 2020-09-07 NOTE — Telephone Encounter (Signed)
error 

## 2020-09-08 NOTE — Progress Notes (Signed)
[facility]  [address] [place], [place] [address] Phone: [phone]  Fax: [phone]   Telephone Office Visit:  09/12/2020  Referring physician: Baxter Hire, MD  I connected with [name] on 09/09/2020 at 2:27 PM by telephone and verified that I was speaking with the correct person using 2 identifiers.  The patient was at home.  I discussed the limitations, risk, security and privacy concerns of performing an evaluation and management service by telephone and the availability of in person appointments.  I also discussed with the patient that there may be a patient responsible charge related to this service.  The patient expressed understanding and agreed to proceed.   Chief Complaint: Andrew Dominguez is a 64 y.o. male with lytic bone lesions who is seen for review of interval studies and discussion regarding of direction of therapy.  HPI:  The patient was last seen in the medical oncology clinic on 08/23/2020. At that time, he noted a 40 pound weight loss in the past 4-5 months. He had shortness of breath on exertion. He had on and off loose stools for the past couple of weeks.  Labs revealed a hematocrit of 34.2, hemoglobin 10.9, MCV 82.6, platelets 179,000, WBC 5,400. LDH was 244. CEA was 1.4. M-spike was 0. Kappa free light chain were 31.3, lambda free light chains 24.2, and ratio 1.29 (normal).  Chest, abdomen, and pelvis CT with contrast on 09/01/2020 revealed innumerable sub solid ground-glass nodules throughout the lungs (largest 2.1 x 1.6 cm in the superior segment of the RLL). There was an enlarged right hilar lymph node. There were multiple bulky, hypodense liver masses. There was a bulky, hypodense splenic mass. There was a subtle hypodense mass of the left kidney. There were enlarged left-sided celiac axis lymph nodes. There were osseous lytic lesions of the proximal femurs and left ilium, as seen on prior MR. There are no additional  osseous lesionsappreciated by CT, however it is very possible that there are additional osseous metastatic lesions which are not manifest by overt bony destruction and could be detected by PET-CT or MRI. 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 The primary lesion was difficult to discern but most likely one of the largest visualized nodules. There was coronary artery disease and aortic atherosclerosis.  He was noted to have bed bugs, in the radiology suite.  Bone scan was deferred until he and his house were treated. The exterminators are coming on 09/14/2020.  During the interim, he has been fine. He states that his weight is stable. He gets short of breath on exertion. His loose stools are on and off. His appetite is good.  The patient is interested in a biopsy.   Past Medical History:  Diagnosis Date  . Anemia   . Arthritis    knees  . COPD (chronic obstructive pulmonary disease) ([place])   . Diabetes mellitus without complication ([place])   . Hyperlipemia   . Hypertension   . Hypertriglyceridemia   . Hypogonadism in male   . Numbness of foot    bilateral  . Obesity   . Tubular adenoma of colon   . Vitamin D deficiency     Past Surgical History:  Procedure Laterality Date  . CATARACT EXTRACTION W/PHACO Right 04/07/2019   Procedure: CATARACT EXTRACTION PHACO AND INTRAOCULAR LENS PLACEMENT ([place])  RIGHT DIABETIC;  Surgeon: Birder Robson, MD;  Location: [place];  Service: Ophthalmology;  Laterality: Right;  Diabetic - oral meds  . COLONOSCOPY    . COLONOSCOPY WITH PROPOFOL N/A 05/16/2015   Procedure: COLONOSCOPY WITH PROPOFOL;  Surgeon: Manya Silvas, MD;  Location: [facility] ENDOSCOPY;  Service: Endoscopy;  Laterality: N/A;  . ORIF FINGER / THUMB FRACTURE Left    thumb, 1970s    Family History  Problem Relation Age of Onset  . Lung cancer Father   . Prostate cancer Neg Hx   . Bladder Cancer  Neg Hx   . Kidney cancer Neg Hx     Social History:  reports that he quit smoking about 21 years ago. He has never used smokeless tobacco. He reports current alcohol use of about 14.0 standard drinks of alcohol per week. He reports that he does not use drugs.  He has a 60-75 pack year smoking history.  He quit smoking 18 years go. He smoked 2-2.5 packs per day for about 30 years. Before 04/2020, he drank 2 "good stiff drinks" every night. He drank 4 beers this past weekend, but other than that, he has not drank since 04/2020. 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 The patient is alone today.  Participants in the patient's visit and their role in the encounter included the patient and Vito Berger, CMA, today.  The intake visit was provided by Vito Berger, CMA.  Allergies:  Allergies  Allergen Reactions  . Sulfa Antibiotics Rash    Current Medications: Current Outpatient Medications  Medication Sig Dispense Refill  . albuterol (VENTOLIN HFA) 108 (90 Base) MCG/ACT inhaler Inhale into the lungs every 6 (six) hours as needed for wheezing or shortness of breath.    Marland Kitchen amLODipine (NORVASC) 10 MG tablet Take 10 mg by mouth daily.    Marland Kitchen atenolol (TENORMIN) 50 MG tablet Take 50 mg by mouth daily.    . B Complex Vitamins (VITAMIN B COMPLEX PO) Take by mouth daily.     . Blood Glucose Monitoring Suppl (GLUCOCOM BLOOD GLUCOSE MONITOR) DEVI 1 each by XX route as directed.    Marland Kitchen ELIQUIS 5 MG TABS tablet Take 5 mg by mouth 2 (two) times daily.    . fenofibrate 54 MG tablet Take 54 mg by mouth daily.    . furosemide (LASIX) 20 MG tablet Take 1 tablet by mouth daily.    Marland Kitchen gabapentin (NEURONTIN) 100 MG capsule Take 100 mg by mouth 3 (three) times daily.    Marland Kitchen glucose blood (ONETOUCH ULTRA) test strip Use 3 (three) times daily E11.29    . hydrochlorothiazide (HYDRODIURIL) 25 MG tablet Take 25 mg by mouth daily.    . Insulin Pen Needle (B-D UF III  MINI PEN NEEDLES) 31G X 5 MM MISC Use once daily E11.29    . JARDIANCE 10 MG TABS tablet 25 mg.     . losartan (COZAAR) 100 MG tablet Take 100 mg by mouth daily.    . metFORMIN (GLUCOPHAGE) 1000 MG tablet Take 1,000 mg by mouth 2 (two) times daily with a meal.    . rosuvastatin (CRESTOR) 5 MG tablet Take 5 mg by mouth daily.    Marland Kitchen Umeclidinium-Vilanterol (ANORO ELLIPTA IN) Inhale into the lungs daily.    Marland Kitchen VICTOZA 18 MG/3ML SOPN     . aspirin EC 81 MG tablet Take 81 mg by mouth daily. (Patient not taking: Reported on 08/23/2020)    . empagliflozin (JARDIANCE) 25 MG TABS tablet Take by mouth.    Marland Kitchen  Furosemide (LASIX PO) Take by mouth daily.     No current facility-administered medications for this visit.    Review of Systems  Constitutional: Negative for chills, diaphoresis, fever, malaise/fatigue and weight loss (stable per pt).  HENT: Negative for congestion, ear discharge, ear pain, hearing loss, nosebleeds, sinus pain, sore throat and tinnitus.   Eyes: Negative for blurred vision.  Respiratory: Positive for shortness of breath (on exertion). Negative for cough, hemoptysis and sputum production.        COPD  Cardiovascular: Negative for chest pain, palpitations and leg swelling.  Gastrointestinal: Negative for abdominal pain, blood in stool, constipation, diarrhea (loose stools, on and off), heartburn, melena, nausea and vomiting.       Good appetite.  Genitourinary: Negative for dysuria, frequency, hematuria and urgency.  Musculoskeletal: Positive for joint pain (left knee). Negative for back pain, myalgias and neck pain.  Skin: Negative for itching and rash.  Neurological: Positive for sensory change (neuropathy in feet). Negative for dizziness, tingling, weakness and headaches.  Endo/Heme/Allergies: Does not bruise/bleed easily.       Diabetes  Psychiatric/Behavioral: Negative for depression and memory loss. The patient is not nervous/anxious and does not have insomnia.   All other  systems reviewed and are negative.  Performance status (ECOG): 1-2  Vitals There were no vitals taken for this visit.    No visits with results within 3 Day(s) from this visit.  Latest known visit with results is:  Office Visit on 08/23/2020  Component Date Value Ref Range Status  . LDH 08/23/2020 244* 98 - 192 U/L Final   Performed at [facility], [address], [place], [place] [address]  . CEA 08/23/2020 1.4  0.0 - 4.7 ng/mL Final   Comment: (NOTE)                             Nonsmokers          <3.9                             Smokers             <5.6 Roche [employer] Electrochemiluminescence Immunoassay (ECLIA) Values obtained with different assay methods or kits cannot be used interchangeably.  Results cannot be interpreted as absolute evidence of the presence or absence of malignant disease. Performed At: [facility] [place], [place] [id number] Rush Farmer MD [id number]   . Kappa free light chain 08/23/2020 31.3* 3.3 - 19.4 mg/L Final  . Lamda free light chains 08/23/2020 24.2  5.7 - 26.3 mg/L Final  . Kappa, lamda light chain ratio 08/23/2020 1.29  0.26 - 1.65 Final   Comment: (NOTE) Performed At: [facility] [place], [place] [id number] Rush Farmer MD [id number]   . IgG (Immunoglobin G), Serum 08/23/2020 890  603 - 1,613 mg/dL Final  . IgA 08/23/2020 209  61 - 437 mg/dL Final  . IgM (Immunoglobulin M), Srm 08/23/2020 55  20 - 172 mg/dL Final  . Total Protein ELP 08/23/2020 6.0  6.0 - 8.5 g/dL Corrected  . Albumin SerPl Elph-Mcnc 08/23/2020 3.2  2.9 - 4.4 g/dL Corrected  . Alpha 1 08/23/2020 0.3  0.0 - 0.4 g/dL Corrected  . Alpha2 Glob SerPl Elph-Mcnc 08/23/2020 0.8  0.4 - 1.0 g/dL Corrected  . B-Globulin SerPl Elph-Mcnc 08/23/2020 1.0  0.7 - 1.3 g/dL Corrected  . Gamma  Glob SerPl Elph-Mcnc 08/23/2020 0.7  0.4 - 1.8 g/dL Corrected  . M Protein SerPl Elph-Mcnc 08/23/2020 Not Observed  Not  Observed g/dL Corrected  . Globulin, Total 08/23/2020 2.8  2.2 - 3.9 g/dL Corrected  . Albumin/Glob SerPl 08/23/2020 1.2  0.7 - 1.7 Corrected  . IFE 1 08/23/2020 Comment   Corrected   Comment: (NOTE) The immunofixation pattern appears unremarkable. Evidence of monoclonal protein is not apparent.   . Please Note 08/23/2020 Comment   Corrected   Comment: (NOTE) Protein electrophoresis scan will follow via computer, mail, or courier delivery. Performed At: [facility] [place], [place] [id number] Rush Farmer MD [id number]   . Sodium 08/23/2020 136  135 - 145 mmol/L Final  . Potassium 08/23/2020 4.2  3.5 - 5.1 mmol/L Final  . Chloride 08/23/2020 102  98 - 111 mmol/L Final  . CO2 08/23/2020 22  22 - 32 mmol/L Final  . Glucose, Bld 08/23/2020 140* 70 - 99 mg/dL Final   Glucose reference range applies only to samples taken after fasting for at least 8 hours.  . BUN 08/23/2020 17  8 - 23 mg/dL Final  . Creatinine, Ser 08/23/2020 1.08  0.61 - 1.24 mg/dL Final  . Calcium 08/23/2020 8.8* 8.9 - 10.3 mg/dL Final  . Total Protein 08/23/2020 6.8  6.5 - 8.1 g/dL Final  . Albumin 08/23/2020 3.6  3.5 - 5.0 g/dL Final  . AST 08/23/2020 14* 15 - 41 U/L Final  . ALT 08/23/2020 10  0 - 44 U/L Final  . Alkaline Phosphatase 08/23/2020 49  38 - 126 U/L Final  . Total Bilirubin 08/23/2020 0.6  0.3 - 1.2 mg/dL Final  . GFR, Estimated 08/23/2020 >60  >60 mL/min Final  . Anion gap 08/23/2020 12  5 - 15 Final   Performed at [facility], [address] [place], [place] [address]  . WBC 08/23/2020 5.4  4.0 - 10.5 K/uL Final  . RBC 08/23/2020 4.14* 4.22 - 5.81 MIL/uL Final  . Hemoglobin 08/23/2020 10.9* 13.0 - 17.0 g/dL Final  . HCT 08/23/2020 34.2* 39 - 52 % Final  . MCV 08/23/2020 82.6  80.0 - 100.0 fL Final  . MCH 08/23/2020 26.3  26.0 - 34.0 pg Final  . MCHC 08/23/2020 31.9  30.0 - 36.0 g/dL Final  . RDW 08/23/2020 14.3  11.5 - 15.5 % Final  . Platelets  08/23/2020 179  150 - 400 K/uL Final  . nRBC 08/23/2020 0.0  0.0 - 0.2 % Final  . Neutrophils Relative % 08/23/2020 75  % Final  . Neutro Abs 08/23/2020 4.0  1.7 - 7.7 K/uL Final  . Lymphocytes Relative 08/23/2020 13  % Final  . Lymphs Abs 08/23/2020 0.7  0.7 - 4.0 K/uL Final  . Monocytes Relative 08/23/2020 10  % Final  . Monocytes Absolute 08/23/2020 0.5  0.1 - 1.0 K/uL Final  . Eosinophils Relative 08/23/2020 1  % Final  . Eosinophils Absolute 08/23/2020 0.1  0.0 - 0.5 K/uL Final  . Basophils Relative 08/23/2020 0  % Final  . Basophils Absolute 08/23/2020 0.0  0.0 - 0.1 K/uL Final  . Immature Granulocytes 08/23/2020 1  % Final  . Abs Immature Granulocytes 08/23/2020 0.03  0.00 - 0.07 K/uL Final   Performed at [facility], [address], [place], [place] [address]    Assessment:  ROBERTO ROMANOSKI is a 64 y.o. male with lytic bone lesions.  Labs on 04/12/2020 revealed a creatinine of  1.1, calcium of 9.5, total protein 6.7, and albumen 4.0.  LFTs were normal. CEA was 1.4 on 08/23/2020. M-spike and FLCA were negative on 08/23/2020.  Left knee CT without contrast on 08/18/2020 revealed tricompartmental osteoarthritis of the left knee. There was a 3.9 x 3.7 x 7.2 cm lytic lesion in the proximal left femoral diaphysis with severe cortical thinning and small areas of cortical destruction most concerning for malignancy (metastatic disease vs multiple myeloma).  Left hip MRI with and without contrast on 08/20/2020 revealed multiple aggressive appearing lesions in both proximal femurs and the left posterior iliac bone c/w metastatic disease or multiple myeloma. The left proximal femur was at risk for pathologic fracture.  Chest, abdomen, and pelvis CT with contrast on 09/01/2020 revealed innumerable sub solid ground-glass lung nodules (largest 2.1 x 1.6 cm in the superior segment of the RLL). There was an enlarged right hilar lymph node. There were multiple bulky, hypodense liver masses.  There was a bulky, hypodense splenic mass. There was a subtle hypodense mass of the left kidney. There were enlarged left-sided celiac axis lymph nodes. There were osseous lytic lesions of the proximal femurs and left ilium, as seen on prior MR. There are no additional osseous lesionsappreciated by CT, however it is very possible that there are additional osseous metastatic lesions which are not manifest by overt bony destruction and could be detected by PET-CT or MRI. 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 The primary lesion was difficult to discern but most likely one of the largest visualized nodules. There was coronary artery disease and aortic atherosclerosis.  He has a 60-75 pack year smoking history.  He is due for a colonoscopy this year (last was 05/16/2015). He has a history of polyps.  He is on Eliquis.  He was noted to have bed bugs on 09/01/2020.  Exterminators are coming on 09/14/2020.  Symptomatically, he has been fine. His weight is stable. He gets short of breath on exertion. He has on and off loose stools.  Plan: 1.   Review labs from 08/23/2020. 2.   Lytic bone lesions   Review chest, abdomen, and pelvis CT from 09/01/2020.  Images personally reviewed.  Agree with radiology findings.   He has multiple lung nodules (largest 2.1 cm in the RLL).   He has multiple liver lesions.   He has bone lesions (await bone scan for additional details).  He has a significant smoking history.  Discuss concern for metastatic lung cancer.  Discuss obtaining tissue for diagnosis and likely [facility].    Treatment plan based on biopsy and ancillary studies.  Discuss liver biopsy.   Patient in agreement.   Obtain clearance for discontinuation of Eliquis for biopsy.  Reschedule bone scan with plain films of weight bearing lesions. 3.   CT guided liver biopsy. Send tissue for path and [facility]. 4.   RN:  Patient on Eliquis; unknown reason; please confirm ok to hold for liver biopsy. 5.   Reschedule bone scan. 6.   RTC 3 days after liver biopsy for MD assessment and discussion regarding direction of therapy.  I discussed the assessment and treatment plan with the patient.  The patient was provided an opportunity to ask questions and all were answered.  The patient agreed with the plan and demonstrated an understanding of the instructions.  The patient was advised to call back if the symptoms worsen or if the condition fails to improve as anticipated.  I provided 7 minutes (2:27 PM - 2:34 PM) of non-face-to-face visit time during this this encounter and > 50% was spent counseling as documented under my assessment and plan.  I provided these services from the [facility] office.  An additional 8 minutes were spent reviewing his chart (Epic and Care Everywhere) including notes, labs, and imaging studies.    Martyn Timme C. Mike Gip, MD, PhD    09/12/2020, 2:27 PM  I, Mirian Mo Tufford, am acting as Education administrator for [employer]. Mike Gip, MD, PhD.  I, Thayne Cindric C. Mike Gip, MD, have reviewed the above documentation for accuracy and completeness, and I agree with the above.

## 2020-09-12 ENCOUNTER — Telehealth (HOSPITAL_BASED_OUTPATIENT_CLINIC_OR_DEPARTMENT_OTHER): Payer: 59 | Admitting: Hematology and Oncology

## 2020-09-12 ENCOUNTER — Encounter: Payer: Self-pay | Admitting: Hematology and Oncology

## 2020-09-12 DIAGNOSIS — M899 Disorder of bone, unspecified: Secondary | ICD-10-CM | POA: Diagnosis not present

## 2020-09-12 DIAGNOSIS — C787 Secondary malignant neoplasm of liver and intrahepatic bile duct: Secondary | ICD-10-CM

## 2020-09-12 DIAGNOSIS — R918 Other nonspecific abnormal finding of lung field: Secondary | ICD-10-CM | POA: Insufficient documentation

## 2020-09-14 ENCOUNTER — Telehealth: Payer: Self-pay

## 2020-09-14 NOTE — Telephone Encounter (Signed)
Spoke with Mrs. Markanthony to inform him that they are ready to get him schedule for the liver bx, They have 2 times request to get him in. Wednesday 7:30 for a 8:30 appointment. Friday 7:30 AM for a 9:30 AM. The patient does not want cut his bearded off. The patient reports he will give [place] a call back and let us know which day.

## 2020-09-15 ENCOUNTER — Telehealth: Payer: Self-pay

## 2020-09-15 ENCOUNTER — Other Ambulatory Visit: Payer: Self-pay | Admitting: Hematology and Oncology

## 2020-09-15 DIAGNOSIS — K769 Liver disease, unspecified: Secondary | ICD-10-CM

## 2020-09-15 NOTE — Telephone Encounter (Signed)
Spoke with the patient which he informed me that he will like to get the appointment schedule for Wednesday at 7:30 for a 8:30 appointment. I have asked the patient will he cut his breaded off the patient states he has been had his breaded since 1974. At this time he do not want to cut if off. I will inform scheduling about this information.

## 2020-09-21 ENCOUNTER — Telehealth: Payer: Self-pay | Admitting: *Deleted

## 2020-09-22 ENCOUNTER — Telehealth: Payer: Self-pay | Admitting: *Deleted

## 2020-09-22 NOTE — Telephone Encounter (Signed)
Spoke with provider on Thursday regarding question patient had about rescheduling biopsy. Provider and scheduler informed of information and currently waiting for clearance to reschedule biopsy.  Called patient to inform him once we had clearance someone would reach back out to him to reschedule biopsy. Also informed patient that he would need to stop taking Eliquis 2 days prior to the date of his biopsy. He verbalized understanding and denied further questions.   Pending rescheduling date for biopsy.

## 2020-09-22 NOTE — Telephone Encounter (Addendum)
Patient wife called reporting that patient did not get the biopsy yesterday and she is asking what is to happen now. Please return call [phone]

## 2020-09-23 NOTE — Telephone Encounter (Signed)
Talked to Marcie Bal in Scheduling and she got the clearance letter and Dr Kathlene Cote approved it. He is scheduled for 11/30 @ 10:30 am  Clearance letter is scanned into Media.

## 2020-09-24 ENCOUNTER — Emergency Department: Payer: 59

## 2020-09-24 ENCOUNTER — Inpatient Hospital Stay (HOSPITAL_COMMUNITY)
Admission: AD | Admit: 2020-09-24 | Discharge: 2020-10-05 | DRG: 478 | Disposition: A | Discharge status: Skilled Nursing Facility | Payer: 59 | Source: Other Acute Inpatient Hospital | Attending: Internal Medicine | Admitting: Internal Medicine

## 2020-09-24 ENCOUNTER — Emergency Department
Admission: EM | Admit: 2020-09-24 | Discharge: 2020-09-24 | Disposition: A | Discharge status: Home / Self Care | Payer: 59 | Attending: Emergency Medicine | Admitting: Emergency Medicine

## 2020-09-24 ENCOUNTER — Other Ambulatory Visit: Payer: Self-pay

## 2020-09-24 DIAGNOSIS — Z7982 Long term (current) use of aspirin: Secondary | ICD-10-CM | POA: Diagnosis not present

## 2020-09-24 DIAGNOSIS — E876 Hypokalemia: Secondary | ICD-10-CM | POA: Diagnosis present

## 2020-09-24 DIAGNOSIS — C8330 Diffuse large B-cell lymphoma, unspecified site: Secondary | ICD-10-CM | POA: Diagnosis present

## 2020-09-24 DIAGNOSIS — Z6838 Body mass index (BMI) 38.0-38.9, adult: Secondary | ICD-10-CM

## 2020-09-24 DIAGNOSIS — E119 Type 2 diabetes mellitus without complications: Secondary | ICD-10-CM | POA: Insufficient documentation

## 2020-09-24 DIAGNOSIS — C787 Secondary malignant neoplasm of liver and intrahepatic bile duct: Secondary | ICD-10-CM | POA: Diagnosis present

## 2020-09-24 DIAGNOSIS — Z23 Encounter for immunization: Secondary | ICD-10-CM

## 2020-09-24 DIAGNOSIS — Z794 Long term (current) use of insulin: Secondary | ICD-10-CM | POA: Diagnosis not present

## 2020-09-24 DIAGNOSIS — Z419 Encounter for procedure for purposes other than remedying health state, unspecified: Secondary | ICD-10-CM

## 2020-09-24 DIAGNOSIS — Z7984 Long term (current) use of oral hypoglycemic drugs: Secondary | ICD-10-CM | POA: Diagnosis not present

## 2020-09-24 DIAGNOSIS — E1142 Type 2 diabetes mellitus with diabetic polyneuropathy: Secondary | ICD-10-CM | POA: Diagnosis present

## 2020-09-24 DIAGNOSIS — Z7901 Long term (current) use of anticoagulants: Secondary | ICD-10-CM

## 2020-09-24 DIAGNOSIS — Z801 Family history of malignant neoplasm of trachea, bronchus and lung: Secondary | ICD-10-CM

## 2020-09-24 DIAGNOSIS — I4891 Unspecified atrial fibrillation: Secondary | ICD-10-CM | POA: Diagnosis not present

## 2020-09-24 DIAGNOSIS — S7225XA Nondisplaced subtrochanteric fracture of left femur, initial encounter for closed fracture: Secondary | ICD-10-CM | POA: Diagnosis not present

## 2020-09-24 DIAGNOSIS — I10 Essential (primary) hypertension: Secondary | ICD-10-CM | POA: Diagnosis present

## 2020-09-24 DIAGNOSIS — J449 Chronic obstructive pulmonary disease, unspecified: Secondary | ICD-10-CM | POA: Diagnosis not present

## 2020-09-24 DIAGNOSIS — E1165 Type 2 diabetes mellitus with hyperglycemia: Secondary | ICD-10-CM | POA: Diagnosis present

## 2020-09-24 DIAGNOSIS — R509 Fever, unspecified: Secondary | ICD-10-CM | POA: Diagnosis not present

## 2020-09-24 DIAGNOSIS — C419 Malignant neoplasm of bone and articular cartilage, unspecified: Secondary | ICD-10-CM | POA: Diagnosis not present

## 2020-09-24 DIAGNOSIS — Z87891 Personal history of nicotine dependence: Secondary | ICD-10-CM | POA: Diagnosis not present

## 2020-09-24 DIAGNOSIS — Z20822 Contact with and (suspected) exposure to covid-19: Secondary | ICD-10-CM | POA: Insufficient documentation

## 2020-09-24 DIAGNOSIS — S72002A Fracture of unspecified part of neck of left femur, initial encounter for closed fracture: Secondary | ICD-10-CM

## 2020-09-24 DIAGNOSIS — S7290XA Unspecified fracture of unspecified femur, initial encounter for closed fracture: Secondary | ICD-10-CM

## 2020-09-24 DIAGNOSIS — D62 Acute posthemorrhagic anemia: Secondary | ICD-10-CM | POA: Diagnosis not present

## 2020-09-24 DIAGNOSIS — Z79899 Other long term (current) drug therapy: Secondary | ICD-10-CM

## 2020-09-24 DIAGNOSIS — S79912A Unspecified injury of left hip, initial encounter: Secondary | ICD-10-CM | POA: Diagnosis present

## 2020-09-24 DIAGNOSIS — M170 Bilateral primary osteoarthritis of knee: Secondary | ICD-10-CM | POA: Diagnosis present

## 2020-09-24 DIAGNOSIS — C3490 Malignant neoplasm of unspecified part of unspecified bronchus or lung: Secondary | ICD-10-CM | POA: Diagnosis present

## 2020-09-24 DIAGNOSIS — X58XXXA Exposure to other specified factors, initial encounter: Secondary | ICD-10-CM | POA: Insufficient documentation

## 2020-09-24 DIAGNOSIS — I429 Cardiomyopathy, unspecified: Secondary | ICD-10-CM | POA: Diagnosis present

## 2020-09-24 DIAGNOSIS — E785 Hyperlipidemia, unspecified: Secondary | ICD-10-CM | POA: Diagnosis present

## 2020-09-24 DIAGNOSIS — Z8781 Personal history of (healed) traumatic fracture: Secondary | ICD-10-CM | POA: Diagnosis not present

## 2020-09-24 DIAGNOSIS — Z882 Allergy status to sulfonamides status: Secondary | ICD-10-CM

## 2020-09-24 DIAGNOSIS — I482 Chronic atrial fibrillation, unspecified: Secondary | ICD-10-CM | POA: Diagnosis present

## 2020-09-24 DIAGNOSIS — R918 Other nonspecific abnormal finding of lung field: Secondary | ICD-10-CM | POA: Diagnosis present

## 2020-09-24 DIAGNOSIS — S72009A Fracture of unspecified part of neck of unspecified femur, initial encounter for closed fracture: Secondary | ICD-10-CM | POA: Diagnosis present

## 2020-09-24 DIAGNOSIS — S72142A Displaced intertrochanteric fracture of left femur, initial encounter for closed fracture: Secondary | ICD-10-CM | POA: Diagnosis not present

## 2020-09-24 DIAGNOSIS — R160 Hepatomegaly, not elsewhere classified: Secondary | ICD-10-CM

## 2020-09-24 DIAGNOSIS — M84559A Pathological fracture in neoplastic disease, hip, unspecified, initial encounter for fracture: Principal | ICD-10-CM | POA: Diagnosis present

## 2020-09-24 DIAGNOSIS — E781 Pure hyperglyceridemia: Secondary | ICD-10-CM | POA: Diagnosis present

## 2020-09-24 LAB — CBC WITH DIFFERENTIAL/PLATELET
Abs Immature Granulocytes: 0.04 10*3/uL (ref 0.00–0.07)
Basophils Absolute: 0 10*3/uL (ref 0.0–0.1)
Basophils Relative: 0 %
Eosinophils Absolute: 0.1 10*3/uL (ref 0.0–0.5)
Eosinophils Relative: 1 %
HCT: 39.1 % (ref 39.0–52.0)
Hemoglobin: 12.3 g/dL — ABNORMAL LOW (ref 13.0–17.0)
Immature Granulocytes: 1 %
Lymphocytes Relative: 19 %
Lymphs Abs: 1.5 10*3/uL (ref 0.7–4.0)
MCH: 25.6 pg — ABNORMAL LOW (ref 26.0–34.0)
MCHC: 31.5 g/dL (ref 30.0–36.0)
MCV: 81.5 fL (ref 80.0–100.0)
Monocytes Absolute: 0.7 10*3/uL (ref 0.1–1.0)
Monocytes Relative: 9 %
Neutro Abs: 5.4 10*3/uL (ref 1.7–7.7)
Neutrophils Relative %: 70 %
Platelets: 248 10*3/uL (ref 150–400)
RBC: 4.8 MIL/uL (ref 4.22–5.81)
RDW: 13.8 % (ref 11.5–15.5)
WBC: 7.7 10*3/uL (ref 4.0–10.5)
nRBC: 0 % (ref 0.0–0.2)

## 2020-09-24 LAB — COMPREHENSIVE METABOLIC PANEL
ALT: 12 U/L (ref 0–44)
AST: 15 U/L (ref 15–41)
Albumin: 3.9 g/dL (ref 3.5–5.0)
Alkaline Phosphatase: 56 U/L (ref 38–126)
Anion gap: 14 (ref 5–15)
BUN: 29 mg/dL — ABNORMAL HIGH (ref 8–23)
CO2: 21 mmol/L — ABNORMAL LOW (ref 22–32)
Calcium: 9.6 mg/dL (ref 8.9–10.3)
Chloride: 101 mmol/L (ref 98–111)
Creatinine, Ser: 1.24 mg/dL (ref 0.61–1.24)
GFR, Estimated: >60 mL/min (ref >60)
Glucose, Bld: 169 mg/dL — ABNORMAL HIGH (ref 70–99)
Potassium: 3.6 mmol/L (ref 3.5–5.1)
Sodium: 136 mmol/L (ref 135–145)
Total Bilirubin: 0.9 mg/dL (ref 0.3–1.2)
Total Protein: 7.5 g/dL (ref 6.5–8.1)

## 2020-09-24 LAB — LACTIC ACID, PLASMA: Lactic Acid, Venous: 1.8 mmol/L (ref 0.5–1.9)

## 2020-09-24 LAB — RESP PANEL BY RT-PCR (FLU A&B, COVID) ARPGX2
Influenza A by PCR: NEGATIVE
Influenza B by PCR: NEGATIVE
SARS Coronavirus 2 by RT PCR: NEGATIVE

## 2020-09-24 LAB — GLUCOSE, CAPILLARY: Glucose-Capillary: 106 mg/dL — ABNORMAL HIGH (ref 70–99)

## 2020-09-24 MED ORDER — ENOXAPARIN SODIUM 40 MG/0.4ML ~~LOC~~ SOLN
40.0000 mg | SUBCUTANEOUS | Status: DC
  Administered 2020-09-24: 40 mg via SUBCUTANEOUS
  Filled 2020-09-24: qty 0.4

## 2020-09-24 MED ORDER — GABAPENTIN 100 MG PO CAPS
100.0000 mg | ORAL_CAPSULE | Freq: Three times a day (TID) | ORAL | Status: DC
  Administered 2020-09-24 – 2020-09-28 (×12): 100 mg via ORAL
  Filled 2020-09-24 (×12): qty 1

## 2020-09-24 MED ORDER — BISACODYL 5 MG PO TBEC
5.0000 mg | DELAYED_RELEASE_TABLET | Freq: Every day | ORAL | Status: DC | PRN
  Administered 2020-09-29: 5 mg via ORAL
  Filled 2020-09-24: qty 1

## 2020-09-24 MED ORDER — MORPHINE SULFATE (PF) 4 MG/ML IV SOLN
4.0000 mg | Freq: Once | INTRAVENOUS | Status: AC
  Administered 2020-09-24: 4 mg via INTRAVENOUS
  Filled 2020-09-24: qty 1

## 2020-09-24 MED ORDER — HYDROCODONE-ACETAMINOPHEN 5-325 MG PO TABS
1.0000 | ORAL_TABLET | Freq: Four times a day (QID) | ORAL | Status: DC | PRN
  Administered 2020-09-24: 2 via ORAL
  Administered 2020-09-25: 1 via ORAL
  Filled 2020-09-24 (×2): qty 2

## 2020-09-24 MED ORDER — ATENOLOL 25 MG PO TABS
12.5000 mg | ORAL_TABLET | Freq: Every day | ORAL | Status: DC
  Administered 2020-09-25 – 2020-10-03 (×9): 12.5 mg via ORAL
  Filled 2020-09-24 (×10): qty 1

## 2020-09-24 MED ORDER — SENNOSIDES-DOCUSATE SODIUM 8.6-50 MG PO TABS: 1.0000 | ORAL_TABLET | Freq: Every evening | ORAL | Status: DC | PRN

## 2020-09-24 MED ORDER — ROSUVASTATIN CALCIUM 5 MG PO TABS
5.0000 mg | ORAL_TABLET | Freq: Every day | ORAL | Status: DC
  Administered 2020-09-26 – 2020-10-05 (×10): 5 mg via ORAL
  Filled 2020-09-24 (×10): qty 1

## 2020-09-24 MED ORDER — HYDROMORPHONE HCL 1 MG/ML IJ SOLN: 1.0000 mg | Freq: Once | INTRAMUSCULAR | Status: DC

## 2020-09-24 MED ORDER — INSULIN ASPART 100 UNIT/ML ~~LOC~~ SOLN
0.0000 [IU] | SUBCUTANEOUS | Status: DC
  Administered 2020-09-25 (×2): 1 [IU] via SUBCUTANEOUS
  Administered 2020-09-26: 2 [IU] via SUBCUTANEOUS
  Administered 2020-09-26: 1 [IU] via SUBCUTANEOUS
  Administered 2020-09-26 – 2020-09-27 (×4): 2 [IU] via SUBCUTANEOUS
  Administered 2020-09-28: 1 [IU] via SUBCUTANEOUS
  Administered 2020-09-28 (×2): 2 [IU] via SUBCUTANEOUS
  Administered 2020-09-28: 1 [IU] via SUBCUTANEOUS
  Administered 2020-09-28 – 2020-09-29 (×2): 2 [IU] via SUBCUTANEOUS
  Administered 2020-09-29: 1 [IU] via SUBCUTANEOUS
  Administered 2020-09-29 – 2020-09-30 (×3): 2 [IU] via SUBCUTANEOUS
  Administered 2020-09-30: 1 [IU] via SUBCUTANEOUS
  Administered 2020-09-30: 2 [IU] via SUBCUTANEOUS
  Administered 2020-09-30: 1 [IU] via SUBCUTANEOUS
  Administered 2020-09-30: 2 [IU] via SUBCUTANEOUS
  Administered 2020-09-30: 1 [IU] via SUBCUTANEOUS
  Administered 2020-10-01: 2 [IU] via SUBCUTANEOUS
  Administered 2020-10-01 (×4): 1 [IU] via SUBCUTANEOUS
  Administered 2020-10-02: 2 [IU] via SUBCUTANEOUS
  Administered 2020-10-02 (×3): 1 [IU] via SUBCUTANEOUS
  Administered 2020-10-03 (×3): 2 [IU] via SUBCUTANEOUS
  Administered 2020-10-04 (×6): 1 [IU] via SUBCUTANEOUS

## 2020-09-24 MED ORDER — SODIUM CHLORIDE 0.9 % IV SOLN: INTRAVENOUS | Status: DC

## 2020-09-24 MED ORDER — MORPHINE SULFATE (PF) 4 MG/ML IV SOLN
INTRAVENOUS | Status: AC
  Filled 2020-09-24: qty 1

## 2020-09-24 MED ORDER — ONDANSETRON HCL 4 MG/2ML IJ SOLN
4.0000 mg | Freq: Once | INTRAMUSCULAR | Status: AC
  Administered 2020-09-24: 4 mg via INTRAVENOUS
  Filled 2020-09-24: qty 2

## 2020-09-24 MED ORDER — MORPHINE SULFATE (PF) 4 MG/ML IV SOLN
4.0000 mg | Freq: Once | INTRAVENOUS | Status: AC
  Administered 2020-09-24: 4 mg via INTRAVENOUS

## 2020-09-24 MED ORDER — UMECLIDINIUM-VILANTEROL 62.5-25 MCG/INH IN AEPB
1.0000 | INHALATION_SPRAY | Freq: Every day | RESPIRATORY_TRACT | Status: DC
  Administered 2020-09-26 – 2020-10-05 (×7): 1 via RESPIRATORY_TRACT
  Filled 2020-09-24: qty 14

## 2020-09-24 MED ORDER — MORPHINE SULFATE (PF) 2 MG/ML IV SOLN
0.5000 mg | INTRAVENOUS | Status: DC | PRN
  Administered 2020-09-24 – 2020-09-25 (×2): 0.5 mg via INTRAVENOUS
  Filled 2020-09-24 (×3): qty 1

## 2020-09-24 MED ORDER — FENOFIBRATE 54 MG PO TABS
54.0000 mg | ORAL_TABLET | Freq: Every day | ORAL | Status: DC
  Administered 2020-09-26 – 2020-10-05 (×10): 54 mg via ORAL
  Filled 2020-09-24 (×11): qty 1

## 2020-09-24 NOTE — Consult Note (Addendum)
ORTHOPAEDIC CONSULTATION  REQUESTING PHYSICIAN: Kayleen Memos, DO  Chief Complaint: " My left hip hurts"  HPI: Andrew Dominguez is a 64 y.o. male who presents with left hip pain.  He states that he awoke this morning and was unable to bear weight on his left leg due to left hip pain.  He denies any injury or fall.  Radiographs revealed intertroch pathologic fracture through the left femur.  He has known history of lytic lesion that was being worked up after being found incidentally while he was having work-up for left total knee arthroplasty.  He has seen oncology and he had a biopsy of his liver that was planned for 10/04/2020 for investigation on possible sources.  He has had a CT scan of his lungs that revealed a "suspicious nodule" but he states that no definitive source of the lesion has been identified thus far.  He does have a history of atrial fibrillation for which he takes Eliquis twice a day.  Last dose of Eliquis was 8:30 AM this morning.  He does have a history of smoking but stopped about 20 years ago.  He smoked from when he was 64 years old until when he was about 64 years old.  Past Medical History:  Diagnosis Date  . Anemia   . Arthritis    knees  . COPD (chronic obstructive pulmonary disease) ([place])   . Diabetes mellitus without complication ([place])   . Hyperlipemia   . Hypertension   . Hypertriglyceridemia   . Hypogonadism in male   . Numbness of foot    bilateral  . Obesity   . Tubular adenoma of colon   . Vitamin D deficiency    Past Surgical History:  Procedure Laterality Date  . CATARACT EXTRACTION W/PHACO Right 04/07/2019   Procedure: CATARACT EXTRACTION PHACO AND INTRAOCULAR LENS PLACEMENT ([place])  RIGHT DIABETIC;  Surgeon: Birder Robson, MD;  Location: [place];  Service: Ophthalmology;  Laterality: Right;  Diabetic - oral meds  . COLONOSCOPY    . COLONOSCOPY WITH PROPOFOL N/A 05/16/2015   Procedure: COLONOSCOPY WITH PROPOFOL;  Surgeon: Manya Silvas, MD;  Location: [facility] ENDOSCOPY;  Service: Endoscopy;  Laterality: N/A;  . ORIF FINGER / THUMB FRACTURE Left    thumb, 1970s   Social History   Socioeconomic History  . Marital status: Legally Separated    Spouse name: Not on file  . Number of children: Not on file  . Years of education: Not on file  . Highest education level: Not on file  Occupational History  . Not on file  Tobacco Use  . Smoking status: Former Smoker    Quit date: 2000    Years since quitting: 21.9  . Smokeless tobacco: Never Used  Vaping Use  . Vaping Use: Never used  Substance and Sexual Activity  . Alcohol use: Yes    Alcohol/week: 14.0 standard drinks    Types: 14 Shots of liquor per week  . Drug use: No  . Sexual activity: Not on file  Other Topics Concern  . Not on file  Social History Narrative  . Not on file   Social Determinants of Health   Financial Resource Strain:   . Difficulty of Paying Living Expenses: Not on file  Food Insecurity:   . Worried About Charity fundraiser in the Last Year: Not on file  . Ran Out of Food in the Last Year: Not on file  Transportation Needs:   . Lack of  Transportation (Medical): Not on file  . Lack of Transportation (Non-Medical): Not on file  Physical Activity:   . Days of Exercise per Week: Not on file  . Minutes of Exercise per Session: Not on file  Stress:   . Feeling of Stress : Not on file  Social Connections:   . Frequency of Communication with Friends and Family: Not on file  . Frequency of Social Gatherings with Friends and Family: Not on file  . Attends Religious Services: Not on file  . Active Member of Clubs or Organizations: Not on file  . Attends Archivist Meetings: Not on file  . Marital Status: Not on file   Family History  Problem Relation Age of Onset  . Lung cancer Father   . Prostate cancer Neg Hx   . Bladder Cancer Neg Hx   . Kidney cancer Neg Hx    - negative except otherwise stated in the family history  section Allergies  Allergen Reactions  . Sulfa Antibiotics Rash   Prior to Admission medications   Medication Sig Start Date End Date Taking? Authorizing Provider  albuterol (VENTOLIN HFA) 108 (90 Base) MCG/ACT inhaler Inhale into the lungs every 6 (six) hours as needed for wheezing or shortness of breath.    [provider]  amLODipine (NORVASC) 10 MG tablet Take 10 mg by mouth daily.    [provider]  aspirin EC 81 MG tablet Take 81 mg by mouth daily. Patient not taking: Reported on 08/23/2020    [provider]  atenolol (TENORMIN) 50 MG tablet Take 50 mg by mouth daily.    [provider]  B Complex Vitamins (VITAMIN B COMPLEX PO) Take by mouth daily.  Patient not taking: Reported on 09/24/2020    [provider]  Blood Glucose Monitoring Suppl (GLUCOCOM BLOOD GLUCOSE MONITOR) DEVI 1 each by XX route as directed. 07/29/15   [provider]  ELIQUIS 5 MG TABS tablet Take 5 mg by mouth 2 (two) times daily. 07/28/20   [provider]  empagliflozin (JARDIANCE) 25 MG TABS tablet Take 25 mg by mouth daily.  02/23/20 02/22/21  [provider]  fenofibrate 54 MG tablet Take 54 mg by mouth daily.    [provider]  Furosemide (LASIX PO) Take by mouth daily.    [provider]  furosemide (LASIX) 20 MG tablet Take 20 mg by mouth daily.  02/03/18   [provider]  gabapentin (NEURONTIN) 100 MG capsule Take 100 mg by mouth 3 (three) times daily.    [provider]  glucose blood (ONETOUCH ULTRA) test strip Use 3 (three) times daily E11.29 08/21/19   [provider]  hydrochlorothiazide (HYDRODIURIL) 25 MG tablet Take 25 mg by mouth daily.    [provider]  Insulin Pen Needle (B-D UF III MINI PEN NEEDLES) 31G X 5 MM MISC Use once daily E11.29 11/25/19   [provider]  JARDIANCE 10 MG TABS tablet 25 mg.  12/01/16   [provider]  losartan (COZAAR) 100 MG  tablet Take 100 mg by mouth daily.    [provider]  metFORMIN (GLUCOPHAGE) 1000 MG tablet Take 1,000 mg by mouth 2 (two) times daily with a meal.    [provider]  rosuvastatin (CRESTOR) 5 MG tablet Take 5 mg by mouth daily.    [provider]  Umeclidinium-Vilanterol (ANORO ELLIPTA IN) Inhale into the lungs daily.    [provider]  VICTOZA 18 MG/3ML  SOPN Inject 1.8 mg into the skin at bedtime.  12/01/16   [provider]   DG Chest 1 View  Result Date: 09/24/2020 CLINICAL DATA:  Palpable object left femoral fracture.  Preop. EXAM: CHEST  1 VIEW COMPARISON:  None. FINDINGS: Lungs are adequately inflated without focal airspace consolidation or effusion. Borderline cardiomegaly. Mild degenerative change of the spine. IMPRESSION: 1. No acute cardiopulmonary disease. 2. Borderline cardiomegaly. Electronically Signed   By: Marin Olp M.D.   On: 09/24/2020 15:17   DG Pelvis 1-2 Views  Result Date: 09/24/2020 CLINICAL DATA:  Possible left femoral pathologic fracture. EXAM: PELVIS - 1-2 VIEW COMPARISON:  CT 08/24/2020 FINDINGS: Mild symmetric degenerative change of the hips. Evidence of displaced subtrochanteric fracture of the left proximal femur through known lytic lesion compatible with pathologic fracture. Degenerative change of the spine. IMPRESSION: Displaced subtrochanteric fracture of the left proximal femur through known lytic lesion compatible with pathologic fracture. Electronically Signed   By: Marin Olp M.D.   On: 09/24/2020 15:16   DG Femur Min 2 Views Left  Result Date: 09/24/2020 CLINICAL DATA:  Possible left femoral pathologic fracture. EXAM: LEFT FEMUR 2 VIEWS COMPARISON:  CT 08/24/2020 FINDINGS: Exam demonstrates a displaced intertrochanteric fracture of the left proximal femur through known lytic lesion compatible with pathologic fracture. Osteoarthritic change of the left knee. IMPRESSION: Displaced intertrochanteric fracture  of the left proximal femur through known lytic lesion compatible with pathologic fracture. Electronically Signed   By: Marin Olp M.D.   On: 09/24/2020 15:15   - pertinent xrays, CT, MRI studies were reviewed and independently interpreted  Positive ROS: All other systems have been reviewed and were otherwise negative with the exception of those mentioned in the HPI and as above.  Physical Exam: General: Alert, no acute distress Psychiatric: Patient is competent for consent with normal mood and affect Lymphatic: No axillary or cervical lymphadenopathy Cardiovascular: No pedal edema Respiratory: No cyanosis, no use of accessory musculature GI: No organomegaly, abdomen is soft and non-tender    Images:  @ENCIMAGES @  Labs:  No results found for: HGBA1C, ESRSEDRATE, CRP, LABURIC, REPTSTATUS, GRAMSTAIN, CULT, LABORGA  Lab Results  Component Value Date   ALBUMIN 3.9 09/24/2020   ALBUMIN 3.6 08/23/2020    Neurologic: Patient does not have protective sensation bilateral lower extremities.   MUSCULOSKELETAL:   Ortho exam demonstrates left lower extremity with shortening and external rotation.  1+ DP pulse of the left lower extremity.  Dorsiflexion and plantar flexion are intact actively.  No tenderness throughout the bilateral feet, ankles, tib-fib, knee, femur distally.  No pain with range of motion of the bilateral hands, wrist, elbow, shoulder.  No tenderness throughout the bilateral hands, wrist, forearm, elbow, humerus.  Assessment: Closed left intertrochanteric femur pathologic fracture  Plan: Plan for intramedullary nail placement tomorrow morning.  N.p.o. after midnight tonight.  Hold any anticoagulation until postop.  Plan to send bone from intraoperative reaming out for pathology.  Likely period of nonweightbearing as it will take a little longer for this fracture to heal than normal due to the lesion involved.  Discussed the risks and benefits of the procedure with the  patient and he is agreeable to fixation tomorrow morning.  Thank you for the consult and the opportunity to see Andrew Dominguez, [facility] [phone] 9:15 PM

## 2020-09-24 NOTE — H&P (Signed)
History and Physical  Andrew Dominguez GNF:[id number] DOB: 1956-10-23 DOA: 09/24/2020  Referring physician: Dr Roosevelt Locks, [facility], accepted direct admit from [facility] ED. PCP: Baxter Hire, MD  Outpatient Specialists: Oncology Patient coming from: Home through [facility] ED.  Chief Complaint: Severe left hip pain  HPI: Andrew Dominguez is a 64 y.o. male with medical history significant for chronic A. fib on Eliquis, essential hypertension, type 2 diabetes, diabetic polyneuropathy, known lytic lesions, unintentional weight loss, COPD, pulmonary nodules, liver masses, splenic mass, left kidney mass, with concern for metastatic lung cancer.  Patient presents with left hip pain with onset 1 month ago and gradually worsening.  Yesterday the pain was so severe he could not bare any weight on his left lower extremity.  At baseline he ambulates with a walker.  He presented to [facility] ED for further evaluation and management.  He denies any trauma or falls.  States that the pain is localized at his left hip where he was told he has lytic lesions.  Has had intermittent loose stools which are not unusual for him.  In the ED, was found to have a left hip fracture on x-ray.  Orthopedic surgery was consulted.  [facility], hospitalist team, was asked to admit.  Presents as a direct admit from [facility] ED.  ED Course: Direct admit from [facility] ED.  Accepted by Dr Roosevelt Locks, [facility].  Review of Systems: Review of systems as noted in the HPI. All other systems reviewed and are negative.   Past Medical History:  Diagnosis Date  . Anemia   . Arthritis    knees  . COPD (chronic obstructive pulmonary disease) ([place])   . Diabetes mellitus without complication ([place])   . Hyperlipemia   . Hypertension   . Hypertriglyceridemia   . Hypogonadism in male   . Numbness of foot    bilateral  . Obesity   . Tubular adenoma of colon   . Vitamin D deficiency    Past Surgical History:  Procedure Laterality Date  . CATARACT EXTRACTION W/PHACO Right 04/07/2019    Procedure: CATARACT EXTRACTION PHACO AND INTRAOCULAR LENS PLACEMENT ([place])  RIGHT DIABETIC;  Surgeon: Birder Robson, MD;  Location: [place];  Service: Ophthalmology;  Laterality: Right;  Diabetic - oral meds  . COLONOSCOPY    . COLONOSCOPY WITH PROPOFOL N/A 05/16/2015   Procedure: COLONOSCOPY WITH PROPOFOL;  Surgeon: Manya Silvas, MD;  Location: [facility] ENDOSCOPY;  Service: Endoscopy;  Laterality: N/A;  . ORIF FINGER / THUMB FRACTURE Left    thumb, 1970s    Social History:  reports that he quit smoking about 21 years ago. He has never used smokeless tobacco. He reports current alcohol use of about 14.0 standard drinks of alcohol per week. He reports that he does not use drugs.   Allergies  Allergen Reactions  . Sulfa Antibiotics Rash    Family History  Problem Relation Age of Onset  . Lung cancer Father   . Prostate cancer Neg Hx   . Bladder Cancer Neg Hx   . Kidney cancer Neg Hx     Prior to Admission medications   Medication Sig Start Date End Date Taking? Authorizing Provider  albuterol (VENTOLIN HFA) 108 (90 Base) MCG/ACT inhaler Inhale into the lungs every 6 (six) hours as needed for wheezing or shortness of breath.    [provider]  amLODipine (NORVASC) 10 MG tablet Take 10 mg by mouth daily.    [provider]  aspirin EC 81 MG tablet Take  81 mg by mouth daily. Patient not taking: Reported on 08/23/2020    [provider]  atenolol (TENORMIN) 50 MG tablet Take 50 mg by mouth daily.    [provider]  B Complex Vitamins (VITAMIN B COMPLEX PO) Take by mouth daily.  Patient not taking: Reported on 09/24/2020    [provider]  Blood Glucose Monitoring Suppl (GLUCOCOM BLOOD GLUCOSE MONITOR) DEVI 1 each by XX route as directed. 07/29/15   [provider]  ELIQUIS 5 MG TABS tablet Take 5 mg by mouth 2 (two) times daily. 07/28/20   [provider]  empagliflozin (JARDIANCE) 25 MG TABS tablet Take 25 mg  by mouth daily.  02/23/20 02/22/21  [provider]  fenofibrate 54 MG tablet Take 54 mg by mouth daily.    [provider]  Furosemide (LASIX PO) Take by mouth daily.    [provider]  furosemide (LASIX) 20 MG tablet Take 20 mg by mouth daily.  02/03/18   [provider]  gabapentin (NEURONTIN) 100 MG capsule Take 100 mg by mouth 3 (three) times daily.    [provider]  glucose blood (ONETOUCH ULTRA) test strip Use 3 (three) times daily E11.29 08/21/19   [provider]  hydrochlorothiazide (HYDRODIURIL) 25 MG tablet Take 25 mg by mouth daily.    [provider]  Insulin Pen Needle (B-D UF III MINI PEN NEEDLES) 31G X 5 MM MISC Use once daily E11.29 11/25/19   [provider]  JARDIANCE 10 MG TABS tablet 25 mg.  12/01/16   [provider]  losartan (COZAAR) 100 MG tablet Take 100 mg by mouth daily.    [provider]  metFORMIN (GLUCOPHAGE) 1000 MG tablet Take 1,000 mg by mouth 2 (two) times daily with a meal.    [provider]  rosuvastatin (CRESTOR) 5 MG tablet Take 5 mg by mouth daily.    [provider]  Umeclidinium-Vilanterol (ANORO ELLIPTA IN) Inhale into the lungs daily.    [provider]  VICTOZA 18 MG/3ML SOPN Inject 1.8 mg into the skin at bedtime.  12/01/16   [provider]    Physical Exam: There were no vitals taken for this visit.  . General: 64 y.o. year-old male well developed well nourished in no acute distress.  Alert and oriented x3. . Cardiovascular: Irregular rate and rhythm with no rubs or gallops.  No thyromegaly or JVD noted.  Trace lower extremity edema. 2/4 pulses in all 4 extremities. Marland Kitchen Respiratory: Clear to auscultation with no wheezes or rales. Good inspiratory effort. . Abdomen: Soft nontender nondistended with normal bowel sounds x4 quadrants. . Muskuloskeletal: No cyanosis or clubbing.  Trace edema in lower extremities  bilaterally. . Neuro: CN II-XII intact, strength, sensation, reflexes . Skin: No ulcerative lesions noted or rashes . Psychiatry: Judgement and insight appear normal. Mood is appropriate for condition and setting          Labs on Admission:  Basic Metabolic Panel: Recent Labs  Lab 09/24/20 1154  NA 136  K 3.6  CL 101  CO2 21*  GLUCOSE 169*  BUN 29*  CREATININE 1.24  CALCIUM 9.6   Liver Function Tests: Recent Labs  Lab 09/24/20 1154  AST 15  ALT 12  ALKPHOS 56  BILITOT 0.9  PROT 7.5  ALBUMIN 3.9   No results for input(s): LIPASE, AMYLASE in the last 168 hours. No results for input(s): AMMONIA in the last 168 hours. CBC: Recent Labs  Lab 09/24/20 1154  WBC 7.7  NEUTROABS 5.4  HGB 12.3*  HCT 39.1  MCV 81.5  PLT 248   Cardiac Enzymes: No results for input(s): CKTOTAL, CKMB, CKMBINDEX, TROPONINI in the last 168 hours.  BNP (last 3 results) No results for input(s): BNP in the last 8760 hours.  ProBNP (last 3 results) No results for input(s): PROBNP in the last 8760 hours.  CBG: No results for input(s): GLUCAP in the last 168 hours.  Radiological Exams on Admission: DG Chest 1 View  Result Date: 09/24/2020 CLINICAL DATA:  Palpable object left femoral fracture.  Preop. EXAM: CHEST  1 VIEW COMPARISON:  None. FINDINGS: Lungs are adequately inflated without focal airspace consolidation or effusion. Borderline cardiomegaly. Mild degenerative change of the spine. IMPRESSION: 1. No acute cardiopulmonary disease. 2. Borderline cardiomegaly. Electronically Signed   By: Marin Olp M.D.   On: 09/24/2020 15:17   DG Pelvis 1-2 Views  Result Date: 09/24/2020 CLINICAL DATA:  Possible left femoral pathologic fracture. EXAM: PELVIS - 1-2 VIEW COMPARISON:  CT 08/24/2020 FINDINGS: Mild symmetric degenerative change of the hips. Evidence of displaced subtrochanteric fracture of the left proximal femur through known lytic lesion compatible with pathologic fracture.  Degenerative change of the spine. IMPRESSION: Displaced subtrochanteric fracture of the left proximal femur through known lytic lesion compatible with pathologic fracture. Electronically Signed   By: Marin Olp M.D.   On: 09/24/2020 15:16   DG Femur Min 2 Views Left  Result Date: 09/24/2020 CLINICAL DATA:  Possible left femoral pathologic fracture. EXAM: LEFT FEMUR 2 VIEWS COMPARISON:  CT 08/24/2020 FINDINGS: Exam demonstrates a displaced intertrochanteric fracture of the left proximal femur through known lytic lesion compatible with pathologic fracture. Osteoarthritic change of the left knee. IMPRESSION: Displaced intertrochanteric fracture of the left proximal femur through known lytic lesion compatible with pathologic fracture. Electronically Signed   By: Marin Olp M.D.   On: 09/24/2020 15:15    EKG: I independently viewed the EKG done and my findings are as followed: Atrial fibrillation rate of 95.  QTc 505.  Assessment/Plan Present on Admission: **None**  Active Problems:   S/p left hip fracture   Acute pathological left hip fracture Presented with chronic left hip pain x1 month, aggressive lesions seen on MRI affecting his left hip and other areas.  No falls or trauma.  This is likely a pathological fracture. Orthopedic surgery has been consulted, Dr. Marlou Sa. Presenting hemoglobin is stable at 12.3K.  Trend and follow H&H. Pain control Hold any anticoagulation until post surgery as recommended by orthopedic surgery. N.p.o. after midnight for possible orthopedic surgery in the morning  Chronic A. fib, rate is controlled. Twelve-lead EKG personally reviewed showing A. fib with a rate of 95. Resume home atenolol at lower dose due to soft blood pressures, to avoid beta-blocker withdrawal. Last dose of Eliquis was at 8:30 AM on 09/24/2020. Eliquis on hold due to possible orthopedic surgery in the morning. Defer to orthopedic surgery to restart Eliquis.  Prolonged QTC QTC 505  on twelve-lead EKG Avoid QTC prolonging agents Repeat twelve-lead EKG in the morning  Concern for metastatic lung cancer Defer management to oncology Bone scan was deferred until he had his house treated for bed bugs, incidentally found on him in the radiology suite.   There is a plan for biopsy on 10/04/2020.  He will need to be off Eliquis for the biopsy. 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 Resume home atenolol at lower dose due to soft blood pressures Monitor vital signs  Hyperlipidemia Resume home medications Crestor and fenofibrate  COPD Stable Resume home regimen  Recent bed bugs Per oncology video visit note on 09/12/20 There was a plan for house treatment on 09/14/20, patient states this was done. Contact precautions for now until no evidence of bed bugs.    DVT prophylaxis: Hold any anticoagulation until post surgery as recommended by orthopedic surgery.  Code Status: Full code as stated by the patient himself.  Family Communication: None at bedside.  Disposition Plan: Admit to telemetry medical, surgical floor.  Consults called: Orthopedic surgery.  Admission status: Inpatient status.  Patient will require at least 2 midnights for further evaluation and treatment of present condition.   Status is: Inpatient    Dispo:  Patient From: Home  Planned Disposition: Home with Health Care Svc  Expected discharge date: 09/27/20  Medically stable for discharge: No, ongoing management of acute pathological left hip fracture.        Kayleen Memos MD [facility] Pager [phone]  If 7PM-7AM, please contact night-coverage [email or web address] Password [email or web address]  09/24/2020, 9:00 PM

## 2020-09-24 NOTE — ED Provider Notes (Signed)
[facility] did not have capacity to accept patient. Discussed with orthopedics at [facility] who felt comfortable with patient and diagnosis. Discussed with Dr. Roosevelt Locks with hospitalist service who accepted the patient in transfer.    Nance Pear, MD 09/24/20 [phone]

## 2020-09-24 NOTE — ED Notes (Signed)
Pt states 10/10 pain L hip that started getting worse yesterday and became intolerable today. Cannot move L hip without "excruciating pain". L hip cancerous mass diagnosed about 3 wk ago.

## 2020-09-24 NOTE — ED Notes (Signed)
Kim from [place] called patient accepted waiting for bed assignment

## 2020-09-24 NOTE — ED Provider Notes (Signed)
[facility] Emergency Department Provider Note ____________________________________________   First MD Initiated Contact with Patient 09/24/20 1356     (approximate)  I have reviewed the triage vital signs and the nursing notes.   HISTORY  Chief Complaint Hip Pain (recently dx with Bone CA)    HPI Andrew Dominguez is a 64 y.o. male with PMH as noted below and recent work-up for possible bone cancer or multiple myeloma presents with left hip pain, acute onset when he woke this morning, and associated with inability to bear weight.  The patient states that he was able to walk with a walker before, but now cannot put any weight on it or move it at all.  He denies any new numbness or weakness, although he has chronic neuropathy.  He has not had any specific trauma or injury.  Past Medical History:  Diagnosis Date  . Anemia   . Arthritis    knees  . COPD (chronic obstructive pulmonary disease) ([place])   . Diabetes mellitus without complication ([place])   . Hyperlipemia   . Hypertension   . Hypertriglyceridemia   . Hypogonadism in male   . Numbness of foot    bilateral  . Obesity   . Tubular adenoma of colon   . Vitamin D deficiency     Patient Active Problem List   Diagnosis Date Noted  . Liver metastases ([place]) 09/12/2020  . Lung nodules 09/12/2020  . Lytic bone lesions on xray 08/23/2020  . Type 2 diabetes mellitus ([place]) 01/01/2017  . Obesity 01/01/2017  . Hypertriglyceridemia 01/01/2017  . Hypertension 01/01/2017  . Hyperlipidemia, unspecified 01/01/2017  . Hyperlipidemia 01/01/2017  . Diabetes mellitus ([place]) 01/01/2017  . Vitamin D deficiency 03/01/2015  . Bilateral leg edema 08/26/2014    Past Surgical History:  Procedure Laterality Date  . CATARACT EXTRACTION W/PHACO Right 04/07/2019   Procedure: CATARACT EXTRACTION PHACO AND INTRAOCULAR LENS PLACEMENT ([place])  RIGHT DIABETIC;  Surgeon: Birder Robson, MD;  Location: [place];   Service: Ophthalmology;  Laterality: Right;  Diabetic - oral meds  . COLONOSCOPY    . COLONOSCOPY WITH PROPOFOL N/A 05/16/2015   Procedure: COLONOSCOPY WITH PROPOFOL;  Surgeon: Manya Silvas, MD;  Location: [facility] ENDOSCOPY;  Service: Endoscopy;  Laterality: N/A;  . ORIF FINGER / THUMB FRACTURE Left    thumb, 1970s    Prior to Admission medications   Medication Sig Start Date End Date Taking? Authorizing Provider  albuterol (VENTOLIN HFA) 108 (90 Base) MCG/ACT inhaler Inhale into the lungs every 6 (six) hours as needed for wheezing or shortness of breath.    [provider]  amLODipine (NORVASC) 10 MG tablet Take 10 mg by mouth daily.    [provider]  aspirin EC 81 MG tablet Take 81 mg by mouth daily. Patient not taking: Reported on 08/23/2020    [provider]  atenolol (TENORMIN) 50 MG tablet Take 50 mg by mouth daily.    [provider]  B Complex Vitamins (VITAMIN B COMPLEX PO) Take by mouth daily.     [provider]  Blood Glucose Monitoring Suppl (GLUCOCOM BLOOD GLUCOSE MONITOR) DEVI 1 each by XX route as directed. 07/29/15   [provider]  ELIQUIS 5 MG TABS tablet Take 5 mg by mouth 2 (two) times daily. 07/28/20   [provider]  empagliflozin (JARDIANCE) 25 MG TABS tablet Take by mouth. 02/23/20 02/22/21  [provider]  fenofibrate 54 MG tablet Take 54 mg by  mouth daily.    [provider]  Furosemide (LASIX PO) Take by mouth daily.    [provider]  furosemide (LASIX) 20 MG tablet Take 1 tablet by mouth daily. 02/03/18   [provider]  gabapentin (NEURONTIN) 100 MG capsule Take 100 mg by mouth 3 (three) times daily.    [provider]  glucose blood (ONETOUCH ULTRA) test strip Use 3 (three) times daily E11.29 08/21/19   [provider]  hydrochlorothiazide (HYDRODIURIL) 25 MG tablet Take 25 mg by mouth daily.    [provider]  Insulin Pen Needle  (B-D UF III MINI PEN NEEDLES) 31G X 5 MM MISC Use once daily E11.29 11/25/19   [provider]  JARDIANCE 10 MG TABS tablet 25 mg.  12/01/16   [provider]  losartan (COZAAR) 100 MG tablet Take 100 mg by mouth daily.    [provider]  metFORMIN (GLUCOPHAGE) 1000 MG tablet Take 1,000 mg by mouth 2 (two) times daily with a meal.    [provider]  rosuvastatin (CRESTOR) 5 MG tablet Take 5 mg by mouth daily.    [provider]  Umeclidinium-Vilanterol (ANORO ELLIPTA IN) Inhale into the lungs daily.    [provider]  Laurens 18 MG/3ML SOPN  12/01/16   [provider]    Allergies Sulfa antibiotics  Family History  Problem Relation Age of Onset  . Lung cancer Father   . Prostate cancer Neg Hx   . Bladder Cancer Neg Hx   . Kidney cancer Neg Hx     Social History Social History   Tobacco Use  . Smoking status: Former Smoker    Quit date: 2000    Years since quitting: 21.9  . Smokeless tobacco: Never Used  Vaping Use  . Vaping Use: Never used  Substance Use Topics  . Alcohol use: Yes    Alcohol/week: 14.0 standard drinks    Types: 14 Shots of liquor per week  . Drug use: No    Review of Systems  Constitutional: No fever. Eyes: No redness. ENT: No sore throat. Cardiovascular: Denies chest pain. Respiratory: Denies shortness of breath. Gastrointestinal: No vomiting. Genitourinary: Negative for dysuria.  Musculoskeletal: Positive for left hip pain. Skin: Negative for rash. Neurological: Negative for headache.   ____________________________________________   PHYSICAL EXAM:  VITAL SIGNS: ED Triage Vitals  Enc Vitals Group     BP 09/24/20 1154 136/81     Pulse Rate 09/24/20 1154 (!) 107     Resp 09/24/20 1154 20     Temp 09/24/20 1154 97.6 F (36.4 C)     Temp Source 09/24/20 1154 Oral     SpO2 09/24/20 1154 97 %     Weight 09/24/20 1156 296 lb (134.3 kg)     Height 09/24/20 1156 6' (1.829 m)      Head Circumference --      [facility] --      Pain Score 09/24/20 1154 9     Pain Loc --      Pain Edu? --      Excl. in [place]? --     Constitutional: Alert and oriented, no acute distress. Eyes: Conjunctivae are normal.  Head: Atraumatic. Nose: No congestion/rhinnorhea. Mouth/Throat: Mucous membranes are moist.   Neck: Normal range of motion.  Cardiovascular: Normal rate, regular rhythm.  Good peripheral circulation. Respiratory: Normal respiratory effort.  No retractions.  Gastrointestinal: No distention.  Musculoskeletal: No lower extremity edema.  Extremities warm and  well perfused.  Left hip with pain on range of motion.  Shortened and externally rotated.  Mild left groin tenderness. Neurologic:  Normal speech and language.  5/5 motor strength and intact sensation to bilateral lower extremities.   Skin:  Skin is warm and dry. No rash noted. Psychiatric: Mood and affect are normal. Speech and behavior are normal.  ____________________________________________   LABS (all labs ordered are listed, but only abnormal results are displayed)  Labs Reviewed  COMPREHENSIVE METABOLIC PANEL - Abnormal; Notable for the following components:      Result Value   CO2 21 (*)    Glucose, Bld 169 (*)    BUN 29 (*)    All other components within normal limits  CBC WITH DIFFERENTIAL/PLATELET - Abnormal; Notable for the following components:   Hemoglobin 12.3 (*)    MCH 25.6 (*)    All other components within normal limits  RESP PANEL BY RT-PCR (FLU A&B, COVID) ARPGX2  LACTIC ACID, PLASMA  LACTIC ACID, PLASMA   ____________________________________________  EKG   ____________________________________________  RADIOLOGY  XR L femur interpreted by me shows a left hip fracture.  ____________________________________________   PROCEDURES  Procedure(s) performed: No  Procedures  Critical Care performed: No ____________________________________________   INITIAL IMPRESSION /  ASSESSMENT AND PLAN / ED COURSE  Pertinent labs & imaging results that were available during my care of the patient were reviewed by me and considered in my medical decision making (see chart for details).  64 year old male with PMH as noted above and undergoing recent work-up for lytic bone lesions presents with acutely worsened pain to the left hip associated with shortening and inability to bear weight.  The patient has had no specific trauma.  I reviewed the past medical records in [place].  The patient was being evaluated by orthopedics for possible knee arthroplasty, when work-up revealed multiple lytic bone lesions confirmed on MRI.  Left proximal femur appeared at risk for pathologic fracture.  He was seen by oncology on 11/8, and the concern was for possible multiple myeloma versus metastatic cancer from another source.  He is scheduled for biopsy on 11/30.  On exam currently, the patient is uncomfortable appearing but in no acute distress.  Vital signs are normal except for borderline tachycardia likely due to pain.  He has external rotation and shortening of the left leg, with tenderness in the left groin.  Both lower extremities are neuro/vascular intact.  Presentation is concerning for a pathologic fracture.  We will obtain x-rays and reassess.  ----------------------------------------- 3:17 PM on 09/24/2020 -----------------------------------------  X-ray confirms an acute fracture in the left proximal femur.  I discussed the case with Dr. Gaspar Bidding from orthopedics who advises that because of the possibility of cancer, need for biopsy and other potential work-up and intervention, he would recommend transfer to a tertiary center that would have orthopedic oncology availability.  The patient agrees with this plan.  I have discussed the case with the transfer center at [facility].  I have signed the patient out to the oncoming ED physician Dr.  Archie Balboa.   ____________________________________________   FINAL CLINICAL IMPRESSION(S) / ED DIAGNOSES  Final diagnoses:  Fracture, proximal femur, left, closed, initial encounter ([place])      NEW MEDICATIONS STARTED DURING THIS VISIT:  New Prescriptions   No medications on file     Note:  This document was prepared using Dragon voice recognition software and may include unintentional dictation errors.    Arta Silence, MD 09/24/20  [place]

## 2020-09-24 NOTE — ED Notes (Signed)
Images powershared by CT to [facility]

## 2020-09-24 NOTE — ED Notes (Signed)
Daughter at bedside.

## 2020-09-24 NOTE — ED Notes (Signed)
Called [facility] for transfer spoke to [place]

## 2020-09-24 NOTE — ED Notes (Signed)
[facility] for transfer [phone]

## 2020-09-24 NOTE — ED Triage Notes (Signed)
Pt arrived via [facility] with reports of L hip pain, pt recently dx with cancer unknown origin, pt states he has 7cm lesion on L hip and due to have bx on 11/30.  Pt was hypotensive with EMS. Has 500cc fluid infusing on arrival.  Pt alert and oriented at this time,  Pt states the pain worsened today, was able to walk some  Yesterday.

## 2020-09-25 ENCOUNTER — Encounter (HOSPITAL_COMMUNITY): Payer: Self-pay | Admitting: Internal Medicine

## 2020-09-25 ENCOUNTER — Inpatient Hospital Stay (HOSPITAL_COMMUNITY): Payer: 59

## 2020-09-25 ENCOUNTER — Other Ambulatory Visit: Payer: Self-pay

## 2020-09-25 ENCOUNTER — Encounter (HOSPITAL_COMMUNITY)
Admission: AD | Disposition: A | Discharge status: Skilled Nursing Facility | Payer: Self-pay | Source: Other Acute Inpatient Hospital | Attending: Internal Medicine

## 2020-09-25 ENCOUNTER — Inpatient Hospital Stay (HOSPITAL_COMMUNITY): Payer: 59 | Admitting: Certified Registered Nurse Anesthetist

## 2020-09-25 ENCOUNTER — Inpatient Hospital Stay: Admit: 2020-09-25 | Payer: 59 | Admitting: Orthopedic Surgery

## 2020-09-25 DIAGNOSIS — S7225XA Nondisplaced subtrochanteric fracture of left femur, initial encounter for closed fracture: Secondary | ICD-10-CM

## 2020-09-25 HISTORY — PX: FEMUR IM NAIL: SHX1597

## 2020-09-25 LAB — BASIC METABOLIC PANEL
Anion gap: 12 (ref 5–15)
BUN: 22 mg/dL (ref 8–23)
CO2: 25 mmol/L (ref 22–32)
Calcium: 9.6 mg/dL (ref 8.9–10.3)
Chloride: 102 mmol/L (ref 98–111)
Creatinine, Ser: 1.19 mg/dL (ref 0.61–1.24)
GFR, Estimated: >60 mL/min (ref >60)
Glucose, Bld: 100 mg/dL — ABNORMAL HIGH (ref 70–99)
Potassium: 4 mmol/L (ref 3.5–5.1)
Sodium: 139 mmol/L (ref 135–145)

## 2020-09-25 LAB — GLUCOSE, CAPILLARY
Glucose-Capillary: 103 mg/dL — ABNORMAL HIGH (ref 70–99)
Glucose-Capillary: 105 mg/dL — ABNORMAL HIGH (ref 70–99)
Glucose-Capillary: 129 mg/dL — ABNORMAL HIGH (ref 70–99)
Glucose-Capillary: 132 mg/dL — ABNORMAL HIGH (ref 70–99)
Glucose-Capillary: 138 mg/dL — ABNORMAL HIGH (ref 70–99)
Glucose-Capillary: 97 mg/dL (ref 70–99)

## 2020-09-25 LAB — CBC
HCT: 36.8 % — ABNORMAL LOW (ref 39.0–52.0)
Hemoglobin: 11.4 g/dL — ABNORMAL LOW (ref 13.0–17.0)
MCH: 25.9 pg — ABNORMAL LOW (ref 26.0–34.0)
MCHC: 31 g/dL (ref 30.0–36.0)
MCV: 83.6 fL (ref 80.0–100.0)
Platelets: 214 10*3/uL (ref 150–400)
RBC: 4.4 MIL/uL (ref 4.22–5.81)
RDW: 13.7 % (ref 11.5–15.5)
WBC: 6.4 10*3/uL (ref 4.0–10.5)
nRBC: 0 % (ref 0.0–0.2)

## 2020-09-25 LAB — HIV ANTIBODY (ROUTINE TESTING W REFLEX): HIV Screen 4th Generation wRfx: NONREACTIVE

## 2020-09-25 LAB — HEMOGLOBIN A1C
Hgb A1c MFr Bld: 5.4 % (ref 4.8–5.6)
Mean Plasma Glucose: 108.28 mg/dL

## 2020-09-25 LAB — MAGNESIUM: Magnesium: 1.7 mg/dL (ref 1.7–2.4)

## 2020-09-25 LAB — MRSA PCR SCREENING: MRSA by PCR: NEGATIVE

## 2020-09-25 SURGERY — INSERTION, INTRAMEDULLARY ROD, FEMUR
Anesthesia: General | Site: Hip | Laterality: Left

## 2020-09-25 MED ORDER — ONDANSETRON HCL 4 MG/2ML IJ SOLN: 4.0000 mg | Freq: Four times a day (QID) | INTRAMUSCULAR | Status: DC | PRN

## 2020-09-25 MED ORDER — CHLORHEXIDINE GLUCONATE 0.12 % MT SOLN
OROMUCOSAL | Status: AC
  Administered 2020-09-25: 15 mL via OROMUCOSAL
  Filled 2020-09-25: qty 15

## 2020-09-25 MED ORDER — SUGAMMADEX SODIUM 200 MG/2ML IV SOLN
INTRAVENOUS | Status: DC | PRN
  Administered 2020-09-25: 300 mg via INTRAVENOUS

## 2020-09-25 MED ORDER — LACTATED RINGERS IV SOLN: INTRAVENOUS | Status: DC

## 2020-09-25 MED ORDER — METOCLOPRAMIDE HCL 5 MG PO TABS
5.0000 mg | ORAL_TABLET | Freq: Three times a day (TID) | ORAL | Status: DC | PRN
  Filled 2020-09-25: qty 2

## 2020-09-25 MED ORDER — ORAL CARE MOUTH RINSE: 15.0000 mL | Freq: Once | OROMUCOSAL | Status: AC

## 2020-09-25 MED ORDER — CEFAZOLIN SODIUM-DEXTROSE 2-4 GM/100ML-% IV SOLN
2.0000 g | Freq: Three times a day (TID) | INTRAVENOUS | Status: AC
  Administered 2020-09-25 – 2020-09-26 (×2): 2 g via INTRAVENOUS
  Filled 2020-09-25 (×2): qty 100

## 2020-09-25 MED ORDER — CHLORHEXIDINE GLUCONATE 4 % EX LIQD
60.0000 mL | Freq: Once | CUTANEOUS | Status: DC
  Filled 2020-09-25: qty 60

## 2020-09-25 MED ORDER — OXYCODONE HCL 5 MG PO TABS
5.0000 mg | ORAL_TABLET | ORAL | Status: DC | PRN
  Administered 2020-09-26 – 2020-09-28 (×3): 10 mg via ORAL
  Administered 2020-09-28 – 2020-09-29 (×2): 5 mg via ORAL
  Administered 2020-09-29 – 2020-09-30 (×2): 10 mg via ORAL
  Administered 2020-09-30: 5 mg via ORAL
  Administered 2020-09-30: 10 mg via ORAL
  Administered 2020-10-01: 5 mg via ORAL
  Administered 2020-10-01: 10 mg via ORAL
  Administered 2020-10-01: 5 mg via ORAL
  Administered 2020-10-02: 10 mg via ORAL
  Administered 2020-10-02: 5 mg via ORAL
  Administered 2020-10-03 – 2020-10-04 (×5): 10 mg via ORAL
  Filled 2020-09-25 (×3): qty 2
  Filled 2020-09-25: qty 1
  Filled 2020-09-25 (×8): qty 2
  Filled 2020-09-25 (×2): qty 1
  Filled 2020-09-25 (×4): qty 2
  Filled 2020-09-25: qty 1

## 2020-09-25 MED ORDER — POVIDONE-IODINE 10 % EX SWAB: 2.0000 "application " | Freq: Once | CUTANEOUS | Status: DC

## 2020-09-25 MED ORDER — ACETAMINOPHEN 500 MG PO TABS
1000.0000 mg | ORAL_TABLET | Freq: Four times a day (QID) | ORAL | Status: AC
  Administered 2020-09-25 – 2020-09-26 (×4): 1000 mg via ORAL
  Filled 2020-09-25 (×5): qty 2

## 2020-09-25 MED ORDER — HYDROMORPHONE HCL 1 MG/ML IJ SOLN: 0.5000 mg | INTRAMUSCULAR | Status: DC | PRN

## 2020-09-25 MED ORDER — ONDANSETRON HCL 4 MG PO TABS: 4.0000 mg | ORAL_TABLET | Freq: Four times a day (QID) | ORAL | Status: DC | PRN

## 2020-09-25 MED ORDER — TRANEXAMIC ACID-NACL 1000-0.7 MG/100ML-% IV SOLN
1000.0000 mg | INTRAVENOUS | Status: AC
  Administered 2020-09-25: 1000 mg via INTRAVENOUS
  Filled 2020-09-25: qty 100

## 2020-09-25 MED ORDER — MENTHOL 3 MG MT LOZG: 1.0000 | LOZENGE | OROMUCOSAL | Status: DC | PRN

## 2020-09-25 MED ORDER — FENTANYL CITRATE (PF) 250 MCG/5ML IJ SOLN
INTRAMUSCULAR | Status: AC
  Filled 2020-09-25: qty 5

## 2020-09-25 MED ORDER — METHOCARBAMOL 500 MG PO TABS
500.0000 mg | ORAL_TABLET | Freq: Four times a day (QID) | ORAL | Status: DC | PRN
  Administered 2020-09-26 – 2020-10-04 (×9): 500 mg via ORAL
  Filled 2020-09-25 (×10): qty 1

## 2020-09-25 MED ORDER — DEXTROSE 5 % IV SOLN
3.0000 g | INTRAVENOUS | Status: AC
  Administered 2020-09-25: 3 mg via INTRAVENOUS
  Filled 2020-09-25 (×4): qty 3000

## 2020-09-25 MED ORDER — METOCLOPRAMIDE HCL 5 MG/ML IJ SOLN: 5.0000 mg | Freq: Three times a day (TID) | INTRAMUSCULAR | Status: DC | PRN

## 2020-09-25 MED ORDER — PROPOFOL 10 MG/ML IV BOLUS
INTRAVENOUS | Status: DC | PRN
  Administered 2020-09-25: 100 mg via INTRAVENOUS

## 2020-09-25 MED ORDER — LACTATED RINGERS IV SOLN: INTRAVENOUS | Status: AC

## 2020-09-25 MED ORDER — INFLUENZA VAC SPLIT QUAD 0.5 ML IM SUSY
0.5000 mL | PREFILLED_SYRINGE | INTRAMUSCULAR | Status: AC
  Administered 2020-09-26: 0.5 mL via INTRAMUSCULAR
  Filled 2020-09-25: qty 0.5

## 2020-09-25 MED ORDER — CHLORHEXIDINE GLUCONATE 0.12 % MT SOLN: 15.0000 mL | Freq: Once | OROMUCOSAL | Status: AC

## 2020-09-25 MED ORDER — METHOCARBAMOL 1000 MG/10ML IJ SOLN
500.0000 mg | Freq: Four times a day (QID) | INTRAVENOUS | Status: DC | PRN
  Filled 2020-09-25 (×2): qty 5

## 2020-09-25 MED ORDER — PHENOL 1.4 % MT LIQD: 1.0000 | OROMUCOSAL | Status: DC | PRN

## 2020-09-25 MED ORDER — PHENYLEPHRINE HCL-NACL 10-0.9 MG/250ML-% IV SOLN
INTRAVENOUS | Status: DC | PRN
  Administered 2020-09-25: 25 ug/min via INTRAVENOUS

## 2020-09-25 MED ORDER — PROPOFOL 10 MG/ML IV BOLUS
INTRAVENOUS | Status: AC
  Filled 2020-09-25: qty 20

## 2020-09-25 MED ORDER — DOCUSATE SODIUM 100 MG PO CAPS
100.0000 mg | ORAL_CAPSULE | Freq: Two times a day (BID) | ORAL | Status: DC
  Administered 2020-09-25 – 2020-10-05 (×20): 100 mg via ORAL
  Filled 2020-09-25 (×22): qty 1

## 2020-09-25 MED ORDER — FENTANYL CITRATE (PF) 100 MCG/2ML IJ SOLN
INTRAMUSCULAR | Status: DC | PRN
  Administered 2020-09-25: 100 ug via INTRAVENOUS
  Administered 2020-09-25 (×2): 50 ug via INTRAVENOUS

## 2020-09-25 MED ORDER — DEXAMETHASONE SODIUM PHOSPHATE 4 MG/ML IJ SOLN
INTRAMUSCULAR | Status: DC | PRN
  Administered 2020-09-25: 5 mg via INTRAVENOUS

## 2020-09-25 MED ORDER — ESMOLOL HCL 100 MG/10ML IV SOLN
INTRAVENOUS | Status: DC | PRN
  Administered 2020-09-25: 20 mg via INTRAVENOUS
  Administered 2020-09-25: 10 mg via INTRAVENOUS

## 2020-09-25 MED ORDER — LIDOCAINE HCL (CARDIAC) PF 100 MG/5ML IV SOSY
PREFILLED_SYRINGE | INTRAVENOUS | Status: DC | PRN
  Administered 2020-09-25: 60 mg via INTRAVENOUS

## 2020-09-25 MED ORDER — ROCURONIUM BROMIDE 100 MG/10ML IV SOLN
INTRAVENOUS | Status: DC | PRN
  Administered 2020-09-25 (×3): 20 mg via INTRAVENOUS
  Administered 2020-09-25: 60 mg via INTRAVENOUS

## 2020-09-25 MED ORDER — 0.9 % SODIUM CHLORIDE (POUR BTL) OPTIME
TOPICAL | Status: DC | PRN
  Administered 2020-09-25: 1000 mL

## 2020-09-25 MED ORDER — APIXABAN 5 MG PO TABS
5.0000 mg | ORAL_TABLET | Freq: Two times a day (BID) | ORAL | Status: DC
  Administered 2020-09-26 – 2020-09-30 (×10): 5 mg via ORAL
  Filled 2020-09-25 (×10): qty 1

## 2020-09-25 SURGICAL SUPPLY — 44 items
BIT DRILL CALIBRTD FREE HND4.3 (BIT) ×2 IMPLANT
BIT DRILL CALIBRTD SHORT 4.9MM (BIT) ×1 IMPLANT
BLADE SURG 15 STRL LF DISP TIS (BLADE) ×1 IMPLANT
BLADE SURG 15 STRL SS (BLADE) ×1
COVER PERINEAL POST (MISCELLANEOUS) ×2 IMPLANT
DRAPE STERI IOBAN 125X83 (DRAPES) ×2 IMPLANT
DRILL CALIBRATED FREE HAND 4.3 (BIT) ×4
DRILL CALIBRATED SHORT 4.9MM (BIT) ×2
DRSG AQUACEL AG ADV 3.5X 4 (GAUZE/BANDAGES/DRESSINGS) ×2 IMPLANT
DRSG AQUACEL AG ADV 3.5X 6 (GAUZE/BANDAGES/DRESSINGS) ×4 IMPLANT
DRSG MEPILEX BORDER 4X8 (GAUZE/BANDAGES/DRESSINGS) ×2 IMPLANT
DURAPREP 26ML APPLICATOR (WOUND CARE) ×2 IMPLANT
ELECT REM PT RETURN 9FT ADLT (ELECTROSURGICAL) ×2
ELECTRODE REM PT RTRN 9FT ADLT (ELECTROSURGICAL) ×1 IMPLANT
GAUZE XEROFORM 5X9 LF (GAUZE/BANDAGES/DRESSINGS) ×2 IMPLANT
GLOVE BIOGEL PI IND STRL 8 (GLOVE) ×1 IMPLANT
GLOVE BIOGEL PI INDICATOR 8 (GLOVE) ×1
GLOVE ECLIPSE 8.0 STRL XLNG CF (GLOVE) ×4 IMPLANT
GOWN STRL REUS W/ TWL LRG LVL3 (GOWN DISPOSABLE) ×2 IMPLANT
GOWN STRL REUS W/ TWL XL LVL3 (GOWN DISPOSABLE) IMPLANT
GOWN STRL REUS W/TWL LRG LVL3 (GOWN DISPOSABLE) ×2
GOWN STRL REUS W/TWL XL LVL3 (GOWN DISPOSABLE)
GUIDEPIN 3.0 THREADED 305MM (PIN) ×2 IMPLANT
GUIDEWIRE BALL NOSE 100CM (WIRE) ×2 IMPLANT
KIT BASIN OR (CUSTOM PROCEDURE TRAY) ×2 IMPLANT
KIT TURNOVER KIT B (KITS) ×2 IMPLANT
NAIL IM TROCH 12X40 LT (Nail) ×2 IMPLANT
NS IRRIG 1000ML POUR BTL (IV SOLUTION) ×2 IMPLANT
PACK GENERAL/GYN (CUSTOM PROCEDURE TRAY) ×2 IMPLANT
PIN GUIDE 3.0 THREADED (PIN) ×4 IMPLANT
SCREW 5.0 FT 45MM (Screw) ×2 IMPLANT
SCREW BONE 5.0X37.5MM CORT Z (Screw) ×2 IMPLANT
SCREW CANC FA 6X100 (Screw) ×2 IMPLANT
SCREW CANC FIXED ANN 6X110 (Screw) ×2 IMPLANT
SCREW CANC STD 6X120 (Screw) ×2 IMPLANT
SCREW CORTICAL HEXAGON 5.0X40 (Screw) ×2 IMPLANT
SPONGE LAP 4X18 RFD (DISPOSABLE) IMPLANT
STAPLER VISISTAT 35W (STAPLE) ×2 IMPLANT
SUT ETHILON 3 0 FSL (SUTURE) ×6 IMPLANT
SUT VIC AB 1 CTX 27 (SUTURE) ×4 IMPLANT
SUT VIC AB 2-0 CTB1 (SUTURE) ×4 IMPLANT
SUT VICRYL 0 AB UR-6 (SUTURE) ×2 IMPLANT
TOWEL GREEN STERILE (TOWEL DISPOSABLE) ×2 IMPLANT
TOWEL GREEN STERILE FF (TOWEL DISPOSABLE) ×2 IMPLANT

## 2020-09-25 NOTE — Progress Notes (Signed)
Progress Note    Andrew Dominguez  WVP:[id number] DOB: [birth year]  DOA: 09/24/2020 PCP: Baxter Hire, MD      Brief Narrative:    Medical records reviewed and are as summarized below:  Andrew Dominguez is a 64 y.o. male with medical history significant for chronic A. fib on Eliquis, essential hypertension, type 2 diabetes, diabetic polyneuropathy, known lytic lesions, unintentional weight loss, COPD, pulmonary nodules, liver masses, splenic mass, left kidney mass, with concern for metastatic lung cancer.  He presented to the hospital with severe left hip pain.  Pain had been going on for about a month but it has progressively gotten worse over time.  He was found to have acute left hip pathological fracture.  He was evaluated by orthopedic surgeon and he underwent intramedullary nail hip screw on 09/25/2020.      Assessment/Plan:   Active Problems:   S/p left hip fracture   Body mass index is 38.5 kg/m.  (Morbid obesity)    Left hip pathologic fracture:s/p intramedullary nail hip screw 09/25/2020.  Analgesics as needed for pain.  Follow-up with orthopedic surgeon.  PT and OT evaluation.  Chronic atrial fibrillation: Resume Eliquis tomorrow.  Suspected metastatic lung cancer: Outpatient follow-up with oncologist.  Plan for  biopsy on 10/04/2020.  Type II DM with hyperglycemia: Hemoglobin A1c is 5.4.  COPD: Stable.  Continue bronchodilators as needed.  Recent bedbug infestation: Patient said his house was treated on 09/14/2020.  Other comorbidities include diabetic polyneuropathy, hypertension, hyperlipidemia    Diet Order            Diet Carb Modified Fluid consistency: Thin; Room service appropriate? Yes  Diet effective now                    Consultants:  Orthopedic surgeon  Procedures:  Intramedullary nail hip screw on 09/26/2011    Medications:    acetaminophen  1,000 mg Oral Q6H   [START ON 09/26/2020] apixaban  5 mg Oral BID    atenolol  12.5 mg Oral Daily   docusate sodium  100 mg Oral BID   fenofibrate  54 mg Oral Daily   gabapentin  100 mg Oral TID   [START ON 09/26/2020] influenza vac split quadrivalent PF  0.5 mL Intramuscular Tomorrow-1000   insulin aspart  0-9 Units Subcutaneous Q4H   rosuvastatin  5 mg Oral Daily   umeclidinium-vilanterol  1 puff Inhalation Daily   Continuous Infusions:  sodium chloride 50 mL/hr at 09/24/20 2200    ceFAZolin (ANCEF) IV     lactated ringers 75 mL/hr at 09/25/20 1449   methocarbamol (ROBAXIN) IV       Anti-infectives (From admission, onward)   Start     Dose/Rate Route Frequency Ordered Stop   09/25/20 1700  ceFAZolin (ANCEF) IVPB 2g/100 mL premix        2 g 200 mL/hr over 30 Minutes Intravenous Every 8 hours 09/25/20 1253 09/26/20 0559   09/25/20 0800  ceFAZolin (ANCEF) 3 g in dextrose 5 % 50 mL IVPB        3 g 100 mL/hr over 30 Minutes Intravenous To Short Stay 09/25/20 5462 09/25/20 0911             Family Communication/Anticipated D/C date and plan/Code Status   DVT prophylaxis: SCDs Start: 09/25/20 1254 Place TED hose Start: 09/25/20 1254 SCDs Start: 09/24/20 2038 apixaban (ELIQUIS) tablet 5 mg     Code Status: Full Code  Family  Communication: None Disposition Plan:    Status is: Inpatient  Remains inpatient appropriate because:Unsafe d/c plan   Dispo:  Patient From: Home  Planned Disposition: Home with Health Care Svc  Expected discharge date: 09/27/20  Medically stable for discharge: No            Subjective:   C/o pain in the left thigh.  No chest pain or shortness of breath.  Objective:    Vitals:   09/25/20 1210 09/25/20 1225 09/25/20 1257 09/25/20 1409  BP: (!) 142/92 131/87 133/85   Pulse: (!) 106 99 90   Resp: 13 15 18    Temp:  97.9 F (36.6 C) 97.7 F (36.5 C)   TempSrc:   Oral   SpO2: 93% 95% 98%   Weight:    128.8 kg  Height:    6' 0.01" (1.829 m)   No data found.   Intake/Output  Summary (Last 24 hours) at 09/25/2020 1619 Last data filed at 09/25/2020 1156 Gross per 24 hour  Intake 900 ml  Output 1000 ml  Net -100 ml   Filed Weights   09/25/20 0500 09/25/20 1409  Weight: 128.8 kg 128.8 kg    Exam:  GEN: NAD SKIN: Warm and dry EYES: EOMI ENT: MMM CV: RRR PULM: CTA B ABD: soft, obese, NT, +BS CNS: AAO x 3, non focal EXT: Clean and dry dressings surgical wounds on the left lateral upper thigh   Data Reviewed:   I have personally reviewed following labs and imaging studies:  Labs: Labs show the following:   Basic Metabolic Panel: Recent Labs  Lab 09/24/20 1154 09/25/20 0316  NA 136 139  K 3.6 4.0  CL 101 102  CO2 21* 25  GLUCOSE 169* 100*  BUN 29* 22  CREATININE 1.24 1.19  CALCIUM 9.6 9.6  MG  --  1.7   GFR Estimated Creatinine Clearance: 87 mL/min (by C-G formula based on SCr of 1.19 mg/dL). Liver Function Tests: Recent Labs  Lab 09/24/20 1154  AST 15  ALT 12  ALKPHOS 56  BILITOT 0.9  PROT 7.5  ALBUMIN 3.9   No results for input(s): LIPASE, AMYLASE in the last 168 hours. No results for input(s): AMMONIA in the last 168 hours. Coagulation profile No results for input(s): INR, PROTIME in the last 168 hours.  CBC: Recent Labs  Lab 09/24/20 1154 09/25/20 0316  WBC 7.7 6.4  NEUTROABS 5.4  --   HGB 12.3* 11.4*  HCT 39.1 36.8*  MCV 81.5 83.6  PLT 248 214   Cardiac Enzymes: No results for input(s): CKTOTAL, CKMB, CKMBINDEX, TROPONINI in the last 168 hours. BNP (last 3 results) No results for input(s): PROBNP in the last 8760 hours. CBG: Recent Labs  Lab 09/24/20 2203 09/25/20 0009 09/25/20 0437 09/25/20 0807 09/25/20 1411  GLUCAP 106* 103* 97 105* 138*   D-Dimer: No results for input(s): DDIMER in the last 72 hours. Hgb A1c: Recent Labs    09/25/20 0316  HGBA1C 5.4   Lipid Profile: No results for input(s): CHOL, HDL, LDLCALC, TRIG, CHOLHDL, LDLDIRECT in the last 72 hours. Thyroid function studies: No  results for input(s): TSH, T4TOTAL, T3FREE, THYROIDAB in the last 72 hours.  Invalid input(s): FREET3 Anemia work up: No results for input(s): VITAMINB12, FOLATE, FERRITIN, TIBC, IRON, RETICCTPCT in the last 72 hours. Sepsis Labs: Recent Labs  Lab 09/24/20 1154 09/24/20 1350 09/25/20 0316  WBC 7.7  --  6.4  LATICACIDVEN  --  1.8  --  Microbiology Recent Results (from the past 240 hour(s))  Resp Panel by RT-PCR (Flu A&B, Covid) Nasopharyngeal Swab     Status: None   Collection Time: 09/24/20  3:38 PM   Specimen: Nasopharyngeal Swab; Nasopharyngeal(NP) swabs in vial transport medium  Result Value Ref Range Status   SARS Coronavirus 2 by RT PCR NEGATIVE NEGATIVE Final    Comment: (NOTE) SARS-CoV-2 target nucleic acids are NOT DETECTED.  The SARS-CoV-2 RNA is generally detectable in upper respiratory specimens during the acute phase of infection. The lowest concentration of SARS-CoV-2 viral copies this assay can detect is 138 copies/mL. A negative result does not preclude SARS-Cov-2 infection and should not be used as the sole basis for treatment or other patient management decisions. A negative result may occur with  improper specimen collection/handling, submission of specimen other than nasopharyngeal swab, presence of viral mutation(s) within the areas targeted by this assay, and inadequate number of viral copies(<138 copies/mL). A negative result must be combined with clinical observations, patient history, and epidemiological information. The expected result is Negative.  Fact Sheet for Patients:  EntrepreneurPulse.com.au  Fact Sheet for Healthcare Providers:  IncredibleEmployment.be  This test is no t yet approved or cleared by the [place] FDA and  has been authorized for detection and/or diagnosis of SARS-CoV-2 by FDA under an Emergency Use Authorization (EUA). This EUA will remain  in effect (meaning this test can be  used) for the duration of the COVID-19 declaration under Section 564(b)(1) of the Act, 21 U.S.C.section 360bbb-3(b)(1), unless the authorization is terminated  or revoked sooner.       Influenza A by PCR NEGATIVE NEGATIVE Final   Influenza B by PCR NEGATIVE NEGATIVE Final    Comment: (NOTE) The Xpert Xpress SARS-CoV-2/FLU/RSV plus assay is intended as an aid in the diagnosis of influenza from Nasopharyngeal swab specimens and should not be used as a sole basis for treatment. Nasal washings and aspirates are unacceptable for Xpert Xpress SARS-CoV-2/FLU/RSV testing.  Fact Sheet for Patients: EntrepreneurPulse.com.au  Fact Sheet for Healthcare Providers: IncredibleEmployment.be  This test is not yet approved or cleared by the [place] FDA and has been authorized for detection and/or diagnosis of SARS-CoV-2 by FDA under an Emergency Use Authorization (EUA). This EUA will remain in effect (meaning this test can be used) for the duration of the COVID-19 declaration under Section 564(b)(1) of the Act, 21 U.S.C. section 360bbb-3(b)(1), unless the authorization is terminated or revoked.  Performed at [facility], [address] [place], [place] [address]   MRSA PCR Screening     Status: None   Collection Time: 09/24/20 11:24 PM   Specimen: Nasopharyngeal  Result Value Ref Range Status   MRSA by PCR NEGATIVE NEGATIVE Final    Comment:        The GeneXpert MRSA Assay (FDA approved for NASAL specimens only), is one component of a comprehensive MRSA colonization surveillance program. It is not intended to diagnose MRSA infection nor to guide or monitor treatment for MRSA infections. Performed at [facility], [place] [address], [place], [place] [address]     Procedures and diagnostic studies:  DG Chest 1 View  Result Date: 09/24/2020 CLINICAL DATA:  Palpable object left femoral fracture.  Preop. EXAM: CHEST  1 VIEW  COMPARISON:  None. FINDINGS: Lungs are adequately inflated without focal airspace consolidation or effusion. Borderline cardiomegaly. Mild degenerative change of the spine. IMPRESSION: 1. No acute cardiopulmonary disease. 2. Borderline cardiomegaly. Electronically Signed   By: Quillian Quince  Derrel Nip M.D.   On: 09/24/2020 15:17   DG Pelvis 1-2 Views  Result Date: 09/24/2020 CLINICAL DATA:  Possible left femoral pathologic fracture. EXAM: PELVIS - 1-2 VIEW COMPARISON:  CT 08/24/2020 FINDINGS: Mild symmetric degenerative change of the hips. Evidence of displaced subtrochanteric fracture of the left proximal femur through known lytic lesion compatible with pathologic fracture. Degenerative change of the spine. IMPRESSION: Displaced subtrochanteric fracture of the left proximal femur through known lytic lesion compatible with pathologic fracture. Electronically Signed   By: Marin Olp M.D.   On: 09/24/2020 15:16   DG C-Arm 1-60 Min  Result Date: 09/25/2020 CLINICAL DATA:  Left IM nail EXAM: DG C-ARM 1-60 MIN CONTRAST:  None FLUOROSCOPY TIME:  Fluoroscopy Time:  2 minutes 52 seconds Number of Acquired Spot Images: 7 COMPARISON:  09/24/2020 FINDINGS: Seven low resolution intraoperative spot views of the left femur are submitted postoperatively. The initial images demonstrate pathologic fracture through the proximal left femur. There is subsequent intramedullary rod with proximal and distal screw fixation of the left femur. IMPRESSION: Intraoperative fluoroscopic assistance provided during intramedullary rod fixation of left femur fracture. Electronically Signed   By: Donavan Foil M.D.   On: 09/25/2020 15:43   DG HIP OPERATIVE UNILAT W OR W/O PELVIS LEFT  Result Date: 09/25/2020 CLINICAL DATA:  Left proximal femur fracture repair. EXAM: OPERATIVE LEFT HIP (WITH PELVIS IF PERFORMED) 7 VIEWS TECHNIQUE: Fluoroscopic spot image(s) were submitted for interpretation post-operatively. FLUOROSCOPY TIME:  2 minutes and 52  seconds. COMPARISON:  September 24, 2020 FINDINGS: Screws were placed through the femoral neck and an intramedullary rod is placed through the femur with 2 distal interlocking screws. IMPRESSION: Left proximal femur fracture repair as above. Electronically Signed   By: Dorise Bullion III M.D   On: 09/25/2020 15:57   DG Femur Min 2 Views Left  Result Date: 09/24/2020 CLINICAL DATA:  Possible left femoral pathologic fracture. EXAM: LEFT FEMUR 2 VIEWS COMPARISON:  CT 08/24/2020 FINDINGS: Exam demonstrates a displaced intertrochanteric fracture of the left proximal femur through known lytic lesion compatible with pathologic fracture. Osteoarthritic change of the left knee. IMPRESSION: Displaced intertrochanteric fracture of the left proximal femur through known lytic lesion compatible with pathologic fracture. Electronically Signed   By: Marin Olp M.D.   On: 09/24/2020 15:15   DG FEMUR PORT MIN 2 VIEWS LEFT  Result Date: 09/25/2020 CLINICAL DATA:  Postop EXAM: LEFT FEMUR PORTABLE 2 VIEWS COMPARISON:  09/24/2020 FINDINGS: Interval intramedullary rod and screw fixation of the left femur for pathologic left lower trochanteric and subtrochanteric femoral fracture. Near anatomic alignment. Gas in the soft tissues consistent with recent surgery. IMPRESSION: Interval intramedullary rod and screw fixation of pathologic proximal left femoral fracture. Electronically Signed   By: Donavan Foil M.D.   On: 09/25/2020 15:44               LOS: 1 day   Sira [employer]  [facility]   Pager on [email or web address]. If 7PM-7AM, please contact night-coverage at [email or web address]     09/25/2020, 4:19 PM

## 2020-09-25 NOTE — Anesthesia Postprocedure Evaluation (Signed)
Anesthesia Post Note  Patient: Andrew Dominguez  Procedure(s) Performed: INTRAMEDULLARY (IM) NAIL HIP SCREW (Left Hip)     Patient location during evaluation: PACU Anesthesia Type: General Level of consciousness: awake and alert Pain management: pain level controlled Vital Signs Assessment: post-procedure vital signs reviewed and stable Respiratory status: spontaneous breathing, nonlabored ventilation and respiratory function stable Cardiovascular status: blood pressure returned to baseline and stable Postop Assessment: no apparent nausea or vomiting Anesthetic complications: no   No complications documented.  Last Vitals:  Vitals:   09/25/20 1210 09/25/20 1225  BP: (!) 142/92 131/87  Pulse: (!) 106 99  Resp: 13 15  Temp:  36.6 C  SpO2: 93% 95%    Last Pain:  Vitals:   09/25/20 1225  TempSrc:   PainSc: 0-No pain                 Manav Pierotti,W. EDMOND

## 2020-09-25 NOTE — Transfer of Care (Signed)
Immediate Anesthesia Transfer of Care Note  Patient: Andrew Dominguez  Procedure(s) Performed: INTRAMEDULLARY (IM) NAIL HIP SCREW (Left Hip)  Patient Location: PACU  Anesthesia Type:General  Level of Consciousness: drowsy and patient cooperative  Airway & Oxygen Therapy: Patient Spontanous Breathing  Post-op Assessment: Report given to RN and Post -op Vital signs reviewed and stable  Post vital signs: Reviewed and stable  Last Vitals:  Vitals Value Taken Time  BP 143/87 09/25/20 1155  Temp 36.7 C 09/25/20 1140  Pulse 106 09/25/20 1155  Resp 17 09/25/20 1155  SpO2 98 % 09/25/20 1155    Last Pain:  Vitals:   09/25/20 1140  TempSrc:   PainSc: 0-No pain         Complications: No complications documented.

## 2020-09-25 NOTE — Brief Op Note (Signed)
   09/25/2020  11:34 AM  PATIENT:  [name]  64 y.o. male  PRE-OPERATIVE DIAGNOSIS:  LEFT HIP PATHOLOGIC FX  POST-OPERATIVE DIAGNOSIS:  LEFT HIP PATHOLOGIC FX  PROCEDURE:  Procedure(s): INTRAMEDULLARY (IM) NAIL HIP SCREW  SURGEON:  Surgeon(s): Marlou Sa, Tonna Corner, MD  ASSISTANT: Annie Main  ANESTHESIA:   general  EBL: 75 ml    No intake/output data recorded.  BLOOD ADMINISTERED: none  DRAINS: none   LOCAL MEDICATIONS USED:  none  SPECIMEN: Proximal reamings sent to path  COUNTS:  YES  TOURNIQUET:  * No tourniquets in log *  DICTATION: .Other Dictation: Dictation Number [id number]  PLAN OF CARE: Admit to inpatient   PATIENT DISPOSITION:  PACU - hemodynamically stable

## 2020-09-25 NOTE — Anesthesia Preprocedure Evaluation (Addendum)
Anesthesia Evaluation  Patient identified by MRN, date of birth, ID band Patient awake    Reviewed: Allergy & Precautions, H&P , NPO status , Patient's Chart, lab work & pertinent test results, reviewed documented beta blocker date and time   Airway Mallampati: III  TM Distance: >3 FB Neck ROM: Full    Dental no notable dental hx. (+) Poor Dentition, Dental Advisory Given   Pulmonary COPD,  COPD inhaler, former smoker,    Pulmonary exam normal breath sounds clear to auscultation       Cardiovascular hypertension, Pt. on medications and Pt. on home beta blockers + dysrhythmias Atrial Fibrillation  Rhythm:Irregular Rate:Normal     Neuro/Psych negative neurological ROS  negative psych ROS   GI/Hepatic negative GI ROS, Neg liver ROS,   Endo/Other  diabetes, Insulin Dependent, Oral Hypoglycemic AgentsMorbid obesity  Renal/GU negative Renal ROS  negative genitourinary   Musculoskeletal  (+) Arthritis , Osteoarthritis,    Abdominal   Peds  Hematology  (+) Blood dyscrasia, anemia ,   Anesthesia Other Findings   Reproductive/Obstetrics negative OB ROS                            Anesthesia Physical Anesthesia Plan  ASA: III  Anesthesia Plan: General   Post-op Pain Management:    Induction: Intravenous  PONV Risk Score and Plan: 3 and Ondansetron, Midazolam and Diphenhydramine  Airway Management Planned: Oral ETT  Additional Equipment:   Intra-op Plan:   Post-operative Plan: Extubation in OR  Informed Consent: I have reviewed the patients History and Physical, chart, labs and discussed the procedure including the risks, benefits and alternatives for the proposed anesthesia with the patient or authorized representative who has indicated his/her understanding and acceptance.     Dental advisory given  Plan Discussed with: CRNA  Anesthesia Plan Comments:         Anesthesia  Quick Evaluation

## 2020-09-25 NOTE — Anesthesia Procedure Notes (Signed)
Procedure Name: Intubation Date/Time: 09/25/2020 9:03 AM Performed by: Oletta Lamas, CRNA Pre-anesthesia Checklist: Patient identified, Emergency Drugs available, Suction available and Patient being monitored Patient Re-evaluated:Patient Re-evaluated prior to induction Oxygen Delivery Method: Circle System Utilized Preoxygenation: Pre-oxygenation with 100% oxygen Induction Type: IV induction Ventilation: Mask ventilation without difficulty Laryngoscope Size: Mac and 4 Grade View: Grade II Tube type: Oral Tube size: 7.5 mm Number of attempts: 1 Airway Equipment and Method: Stylet and Oral airway Placement Confirmation: ETT inserted through vocal cords under direct vision,  positive ETCO2 and breath sounds checked- equal and bilateral Secured at: 23 cm Tube secured with: Tape Dental Injury: Teeth and Oropharynx as per pre-operative assessment

## 2020-09-26 ENCOUNTER — Encounter (HOSPITAL_COMMUNITY): Payer: Self-pay | Admitting: Orthopedic Surgery

## 2020-09-26 LAB — BASIC METABOLIC PANEL
Anion gap: 14 (ref 5–15)
BUN: 21 mg/dL (ref 8–23)
CO2: 24 mmol/L (ref 22–32)
Calcium: 9 mg/dL (ref 8.9–10.3)
Chloride: 100 mmol/L (ref 98–111)
Creatinine, Ser: 1.18 mg/dL (ref 0.61–1.24)
GFR, Estimated: >60 mL/min (ref >60)
Glucose, Bld: 136 mg/dL — ABNORMAL HIGH (ref 70–99)
Potassium: 3.4 mmol/L — ABNORMAL LOW (ref 3.5–5.1)
Sodium: 138 mmol/L (ref 135–145)

## 2020-09-26 LAB — CBC
HCT: 33.5 % — ABNORMAL LOW (ref 39.0–52.0)
Hemoglobin: 10.3 g/dL — ABNORMAL LOW (ref 13.0–17.0)
MCH: 25.4 pg — ABNORMAL LOW (ref 26.0–34.0)
MCHC: 30.7 g/dL (ref 30.0–36.0)
MCV: 82.5 fL (ref 80.0–100.0)
Platelets: 182 10*3/uL (ref 150–400)
RBC: 4.06 MIL/uL — ABNORMAL LOW (ref 4.22–5.81)
RDW: 13.8 % (ref 11.5–15.5)
WBC: 6.5 10*3/uL (ref 4.0–10.5)
nRBC: 0 % (ref 0.0–0.2)

## 2020-09-26 LAB — GLUCOSE, CAPILLARY
Glucose-Capillary: 116 mg/dL — ABNORMAL HIGH (ref 70–99)
Glucose-Capillary: 130 mg/dL — ABNORMAL HIGH (ref 70–99)
Glucose-Capillary: 165 mg/dL — ABNORMAL HIGH (ref 70–99)
Glucose-Capillary: 175 mg/dL — ABNORMAL HIGH (ref 70–99)
Glucose-Capillary: 83 mg/dL (ref 70–99)
Glucose-Capillary: 95 mg/dL (ref 70–99)

## 2020-09-26 LAB — MAGNESIUM: Magnesium: 1.8 mg/dL (ref 1.7–2.4)

## 2020-09-26 MED ORDER — POTASSIUM CHLORIDE CRYS ER 20 MEQ PO TBCR
40.0000 meq | EXTENDED_RELEASE_TABLET | Freq: Once | ORAL | Status: AC
  Administered 2020-09-26: 40 meq via ORAL
  Filled 2020-09-26: qty 2

## 2020-09-26 MED ORDER — ADULT MULTIVITAMIN W/MINERALS CH
1.0000 | ORAL_TABLET | Freq: Every day | ORAL | Status: DC
  Administered 2020-09-26 – 2020-10-05 (×10): 1 via ORAL
  Filled 2020-09-26 (×9): qty 1

## 2020-09-26 MED ORDER — GLUCERNA SHAKE PO LIQD
237.0000 mL | Freq: Three times a day (TID) | ORAL | Status: DC
  Administered 2020-09-26 – 2020-10-05 (×24): 237 mL via ORAL

## 2020-09-26 NOTE — Plan of Care (Signed)
  Problem: Education: Goal: Knowledge of General Education information will improve Description Including pain rating scale, medication(s)/side effects and non-pharmacologic comfort measures Outcome: Progressing   Problem: Nutrition: Goal: Adequate nutrition will be maintained Outcome: Progressing   Problem: Pain Managment: Goal: General experience of comfort will improve Outcome: Progressing   

## 2020-09-26 NOTE — Progress Notes (Signed)
Orthopedic Tech Progress Note Patient Details:  Andrew Dominguez 1956/02/25 [id number]  Patient ID: Andrew Dominguez, male   DOB: 1956-01-16, 64 y.o.   MRN: [id number] [facility]  [name] 09/26/2020, 6:26 AM

## 2020-09-26 NOTE — Evaluation (Signed)
Physical Therapy Evaluation Patient Details Name: JODI KAPPES MRN: [id number] DOB: October 21, 1956 Today's Date: 09/26/2020   History of Present Illness  Patient is a 64 y/o male with PMH of chronic Afib on Eliquis, essential HTN, type II DM, diabetic neuropathy, known lytic lesions, unintentional weight loss, COPD, pulmonary nodules, liver massess, splenic mass, left kidney mass, with concern for metastatic lung cancer. Patient presented with L hip pain with onset 1 month ago and gradually worsening. Patient seen in ED with L hip fracture with metastatic process. Patient s/p IM nail of L femur on 11/21.  Clinical Impression  PTA, patient used rollator for ambulation and unable to get into shower to bathe which resulted in sink baths with washcloths. Patient required maxA for supine>sit with assist for L LE advancement and trunk elevation to EOB. Deferred standing this session due to dizziness and lightheadedness with minimal relief, patient request to return to supine after >15 minutes sitting EOB. Sit>supine with minA for L LE advancement onto bed. Patient presents with increased pain, decreased activity tolerance, generalized weakness, impaired sensation, impaired balance, and impaired functional mobility. Patient will benefit from skilled PT services during acute stay to address listed deficits. Recommend SNF following discharge to maximize functional mobility and independence.      Follow Up Recommendations SNF;Supervision/Assistance - 24 hour    Equipment Recommendations  Rolling Judea Fennimore with 5" wheels;3in1 (PT);Wheelchair cushion (measurements PT);Wheelchair (measurements PT)    Recommendations for Other Services OT consult     Precautions / Restrictions Precautions Precautions: Fall Restrictions Weight Bearing Restrictions: Yes LLE Weight Bearing: Non weight bearing      Mobility  Bed Mobility Overal bed mobility: Needs Assistance Bed Mobility: Supine to Sit;Sit to Supine      Supine to sit: Max assist Sit to supine: Min assist   General bed mobility comments: maxA for supine>sit with assist for L LE advancement and trunk elevation to EOB    Transfers                 General transfer comment: deferred due to dizziness and lightheadedness upon sitting EOB with minimal relief. Patient requested to return to supine  Ambulation/Gait                Stairs            Wheelchair Mobility    Modified Rankin (Stroke Patients Only)       Balance Overall balance assessment: Needs assistance Sitting-balance support: No upper extremity supported;Feet supported Sitting balance-Leahy Scale: Fair                                       Pertinent Vitals/Pain Pain Assessment: Faces Faces Pain Scale: Hurts whole lot Pain Location: L hip Pain Descriptors / Indicators: Grimacing;Guarding Pain Intervention(s): Limited activity within patient's tolerance;Monitored during session;Repositioned    Home Living Family/patient expects to be discharged to:: Private residence Living Arrangements: Spouse/significant other Available Help at Discharge: Family;Available PRN/intermittently Type of Home: House Home Access: Stairs to enter Entrance Stairs-Rails: None Entrance Stairs-Number of Steps: 2 (small steps (similar to threshold) Home Layout: One level Home Equipment: Beulah Matusek - 4 wheels;Cane - single point      Prior Function Level of Independence: Needs assistance   Gait / Transfers Assistance Needed: uses rollator for ambulation  ADL's / Homemaking Assistance Needed: states his L knee too hurtful, patient stopped using shower and performed bird bath (  washcloth) bathing        Hand Dominance        Extremity/Trunk Assessment   Upper Extremity Assessment Upper Extremity Assessment: Generalized weakness    Lower Extremity Assessment Lower Extremity Assessment: LLE deficits/detail;RLE deficits/detail RLE Sensation:  history of peripheral neuropathy LLE Deficits / Details: patient unable to perform SLR against gravity or gravity eliminated LLE Sensation: history of peripheral neuropathy       Communication   Communication: No difficulties  Cognition Arousal/Alertness: Awake/alert Behavior During Therapy: WFL for tasks assessed/performed Overall Cognitive Status: Within Functional Limits for tasks assessed                                        General Comments      Exercises     Assessment/Plan    PT Assessment Patient needs continued PT services  PT Problem List Decreased strength;Decreased range of motion;Decreased activity tolerance;Decreased balance;Decreased mobility;Pain;Impaired sensation       PT Treatment Interventions DME instruction;Gait training;Stair training;Functional mobility training;Therapeutic activities;Therapeutic exercise;Balance training;Patient/family education;Wheelchair mobility training    PT Goals (Current goals can be found in the Care Plan section)  Acute Rehab PT Goals Patient Stated Goal: to get better PT Goal Formulation: With patient Time For Goal Achievement: 10/10/20 Potential to Achieve Goals: Fair    Frequency Min 3X/week   Barriers to discharge        Co-evaluation               AM-PAC PT "6 Clicks" Mobility  Outcome Measure Help needed turning from your back to your side while in a flat bed without using bedrails?: A Lot Help needed moving from lying on your back to sitting on the side of a flat bed without using bedrails?: A Lot Help needed moving to and from a bed to a chair (including a wheelchair)?: A Lot Help needed standing up from a chair using your arms (e.g., wheelchair or bedside chair)?: A Lot Help needed to walk in hospital room?: Total Help needed climbing 3-5 steps with a railing? : Total 6 Click Score: 10    End of Session   Activity Tolerance: Patient limited by pain Patient left: in bed;with  call bell/phone within reach Nurse Communication: Mobility status PT Visit Diagnosis: Unsteadiness on feet (R26.81);Muscle weakness (generalized) (M62.81);Difficulty in walking, not elsewhere classified (R26.2);Pain Pain - Right/Left: Left Pain - part of body: Hip    Time: 7342-8768 PT Time Calculation (min) (ACUTE ONLY): 47 min   Charges:   PT Evaluation $PT Eval Moderate Complexity: 1 Mod PT Treatments $Therapeutic Activity: 23-37 mins        Perrin Maltese, PT, DPT Acute Rehabilitation Services Pager [phone] Office [phone]   Alda Lea 09/26/2020, 4:17 PM

## 2020-09-26 NOTE — Op Note (Signed)
NAME: JAXSEN, BERNHART MEDICAL RECORD PF:[id number] ACCOUNT [id number] DATE OF BIRTH:07-Jul-1956 FACILITY: [facility] LOCATION: [facility] PHYSICIAN:Gerald Honea Randel Pigg, MD  OPERATIVE REPORT  DATE OF PROCEDURE:  09/25/2020  PREOPERATIVE DIAGNOSIS:  Left hip pathologic proximal femur fracture.  POSTOPERATIVE DIAGNOSIS:  Left hip pathologic proximal femur fracture.  PROCEDURE:  Left hip pathologic femur fracture intramedullary nailing and tissue sample sent to pathology.  SURGEON:  Meredith Pel, MD  ASSISTANT:  Annie Main, PA.  INDICATIONS:  The patient is a 64 year old patient with pathologic proximal femur fracture who presents for operative management after explanation of risks and benefits.  PROCEDURE IN DETAIL:  The patient was brought to the operating room where general anesthetic was induced.  Preoperative antibiotics administered.  Timeout was called.  The patient was placed on the fracture bed and with the left leg under traction and  the right leg in lithotomy position with the right peroneal nerve well padded.  With traction, reasonable fracture reduction was achieved.  This was confirmed in the AP and lateral planes under fluoroscopy.  A timeout was called.  Left leg region was  then pre-scrubbed with alcohol and Betadine, allowed to air dry, prepped with DuraPrep solution and draped in a sterile manner.  Ioban used to cover the operative field.  Incision was made about 4 fingerbreadths proximal to the tip of the trochanter.   Guide pin placed through the tip of the trochanter across the fracture site into the shaft.  Proximal reaming performed.  These proximal reamings were sent to pathology for analysis.  Reaming was performed up to 13.5 mm.  A Zimmer Recon nail was then  placed, which was 40 x 12 mm.  Fracture was reasonably well reduced.  Two screws placed into the head and then traction was released.  Two distal interlocking screws were then placed across the distal femur.  Correct  placement confirmed the AP and  lateral planes under fluoroscopy.  Next, a thorough irrigation was performed on all incisions.  Interlocking screw incisions irrigated and closed using 2-0 Vicryl, 3-0 nylon.  The larger incisions, which were closed using 0 Vicryl suture, 2-0 Vicryl  suture and 3-0 nylon.  Accessory incision was required to use a Cobb elevator to essentially push the femoral neck down prior to placing the femoral head screws.  This was to allow for better reduction.  That incision was also irrigated and closed in the  standard fashion.  Aquacel dressing was placed.  The patient tolerated the procedure well without immediate complications.  Correct rotational alignment was confirmed before placing the interlocking screws distally.  The patient tolerated the procedure  well without immediate complication.  Luke's assistance was required for retraction, opening and closing, mobilization of tissues.  His assistance was a medical necessity.  IN/NUANCE  D:09/25/2020 T:09/25/2020 JOB:[id number]

## 2020-09-26 NOTE — Progress Notes (Addendum)
Progress Note    Andrew Dominguez  QQI:[id number] DOB: 07/15/1956  DOA: 09/24/2020 PCP: Baxter Hire, MD      Brief Narrative:    Medical records reviewed and are as summarized below:  Andrew Dominguez is a 64 y.o. male with medical history significant for chronic A. fib on Eliquis, essential hypertension, type 2 diabetes, diabetic polyneuropathy, known lytic lesions, unintentional weight loss, COPD, pulmonary nodules, liver masses, splenic mass, left kidney mass, with concern for metastatic lung cancer.  He presented to the hospital with severe left hip pain.  Pain had been going on for about a month but it has progressively gotten worse over time.  He was found to have acute left hip pathological fracture.  He was evaluated by orthopedic surgeon and he underwent intramedullary nail hip screw on 09/25/2020.      Assessment/Plan:   Active Problems:   Liver metastases (HCC)   Lung nodules   S/p left hip fracture   Body mass index is 38.5 kg/m.  (Morbid obesity)    Left hip pathologic fracture:s/p intramedullary nail hip screw 09/25/2020.  Continue analgesics as needed for pain.  Follow-up with orthopedic surgeon.  Continue PT and OT.  Chronic atrial fibrillation: Resume Eliquis.  Suspected metastatic lung cancer: Outpatient follow-up with oncologist.  Plan for  biopsy on 10/04/2020.  Eliquis to be held prior to biopsy.  Hypokalemia: Replete potassium and monitor levels.  Type II DM with hyperglycemia: Hemoglobin A1c is 5.4.  COPD: Stable.  Continue bronchodilators as needed.  Recent bedbug infestation: Patient said his house was treated on 09/14/2020.  Other comorbidities include diabetic polyneuropathy, hypertension, hyperlipidemia    Diet Order            Diet Carb Modified Fluid consistency: Thin; Room service appropriate? Yes  Diet effective now                    Consultants:  Orthopedic surgeon  Procedures:  Intramedullary nail hip  screw on 09/26/2011    Medications:   . apixaban  5 mg Oral BID  . atenolol  12.5 mg Oral Daily  . docusate sodium  100 mg Oral BID  . feeding supplement (GLUCERNA SHAKE)  237 mL Oral TID BM  . fenofibrate  54 mg Oral Daily  . gabapentin  100 mg Oral TID  . insulin aspart  0-9 Units Subcutaneous Q4H  . multivitamin with minerals  1 tablet Oral Daily  . rosuvastatin  5 mg Oral Daily  . umeclidinium-vilanterol  1 puff Inhalation Daily   Continuous Infusions: . methocarbamol (ROBAXIN) IV       Anti-infectives (From admission, onward)   Start     Dose/Rate Route Frequency Ordered Stop   09/25/20 1700  ceFAZolin (ANCEF) IVPB 2g/100 mL premix        2 g 200 mL/hr over 30 Minutes Intravenous Every 8 hours 09/25/20 1253 09/26/20 0040   09/25/20 0800  ceFAZolin (ANCEF) 3 g in dextrose 5 % 50 mL IVPB        3 g 100 mL/hr over 30 Minutes Intravenous To Short Stay 09/25/20 9417 09/25/20 0911             Family Communication/Anticipated D/C date and plan/Code Status   DVT prophylaxis: SCDs Start: 09/25/20 1254 Place TED hose Start: 09/25/20 1254 SCDs Start: 09/24/20 2038 apixaban (ELIQUIS) tablet 5 mg     Code Status: Full Code  Family Communication: None Disposition Plan:  Status is: Inpatient  Remains inpatient appropriate because:Unsafe d/c plan   Dispo:  Patient From: Home  Planned Disposition: Home with Health Care Svc  Expected discharge date: 09/27/20  Medically stable for discharge: No            Subjective:   No complaints.  Pain in the left thigh is manageable.  Objective:    Vitals:   09/25/20 2009 09/26/20 0236 09/26/20 0440 09/26/20 1410  BP: 113/66 107/72 116/69 111/72  Pulse: 92 94 93 (!) 105  Resp: 17 16 16 18   Temp: 99.5 F (37.5 C) 98.3 F (36.8 C) 98.7 F (37.1 C) 99.3 F (37.4 C)  TempSrc: Oral Oral Oral Oral  SpO2: 95% 96% 96% 97%  Weight:      Height:       No data found.   Intake/Output Summary (Last 24  hours) at 09/26/2020 1524 Last data filed at 09/26/2020 1409 Gross per 24 hour  Intake 1434.59 ml  Output 4375 ml  Net -2940.41 ml   Filed Weights   09/25/20 0500 09/25/20 1409  Weight: 128.8 kg 128.8 kg    Exam:  GEN: NAD SKIN: Warm and dry EYES: EOMI ENT: MMM CV: RRR PULM: CTA B ABD: soft, obese, NT, +BS CNS: AAO x 3, non focal EXT: Dressings on surgical wounds on the left side lateral upper and lower thighs are clean      Data Reviewed:   I have personally reviewed following labs and imaging studies:  Labs: Labs show the following:   Basic Metabolic Panel: Recent Labs  Lab 09/24/20 1154 09/24/20 1154 09/25/20 0316 09/26/20 0256  NA 136  --  139 138  K 3.6   < > 4.0 3.4*  CL 101  --  102 100  CO2 21*  --  25 24  GLUCOSE 169*  --  100* 136*  BUN 29*  --  22 21  CREATININE 1.24  --  1.19 1.18  CALCIUM 9.6  --  9.6 9.0  MG  --   --  1.7 1.8   < > = values in this interval not displayed.   GFR Estimated Creatinine Clearance: 87.8 mL/min (by C-G formula based on SCr of 1.18 mg/dL). Liver Function Tests: Recent Labs  Lab 09/24/20 1154  AST 15  ALT 12  ALKPHOS 56  BILITOT 0.9  PROT 7.5  ALBUMIN 3.9   No results for input(s): LIPASE, AMYLASE in the last 168 hours. No results for input(s): AMMONIA in the last 168 hours. Coagulation profile No results for input(s): INR, PROTIME in the last 168 hours.  CBC: Recent Labs  Lab 09/24/20 1154 09/25/20 0316 09/26/20 0256  WBC 7.7 6.4 6.5  NEUTROABS 5.4  --   --   HGB 12.3* 11.4* 10.3*  HCT 39.1 36.8* 33.5*  MCV 81.5 83.6 82.5  PLT 248 214 182   Cardiac Enzymes: No results for input(s): CKTOTAL, CKMB, CKMBINDEX, TROPONINI in the last 168 hours. BNP (last 3 results) No results for input(s): PROBNP in the last 8760 hours. CBG: Recent Labs  Lab 09/25/20 2002 09/26/20 0001 09/26/20 0435 09/26/20 0802 09/26/20 1157  GLUCAP 132* 83 95 116* 165*   D-Dimer: No results for input(s): DDIMER in  the last 72 hours. Hgb A1c: Recent Labs    09/25/20 0316  HGBA1C 5.4   Lipid Profile: No results for input(s): CHOL, HDL, LDLCALC, TRIG, CHOLHDL, LDLDIRECT in the last 72 hours. Thyroid function studies: No results for input(s): TSH, T4TOTAL, T3FREE, THYROIDAB  in the last 72 hours.  Invalid input(s): FREET3 Anemia work up: No results for input(s): VITAMINB12, FOLATE, FERRITIN, TIBC, IRON, RETICCTPCT in the last 72 hours. Sepsis Labs: Recent Labs  Lab 09/24/20 1154 09/24/20 1350 09/25/20 0316 09/26/20 0256  WBC 7.7  --  6.4 6.5  LATICACIDVEN  --  1.8  --   --     Microbiology Recent Results (from the past 240 hour(s))  Resp Panel by RT-PCR (Flu A&B, Covid) Nasopharyngeal Swab     Status: None   Collection Time: 09/24/20  3:38 PM   Specimen: Nasopharyngeal Swab; Nasopharyngeal(NP) swabs in vial transport medium  Result Value Ref Range Status   SARS Coronavirus 2 by RT PCR NEGATIVE NEGATIVE Final    Comment: (NOTE) SARS-CoV-2 target nucleic acids are NOT DETECTED.  The SARS-CoV-2 RNA is generally detectable in upper respiratory specimens during the acute phase of infection. The lowest concentration of SARS-CoV-2 viral copies this assay can detect is 138 copies/mL. A negative result does not preclude SARS-Cov-2 infection and should not be used as the sole basis for treatment or other patient management decisions. A negative result may occur with  improper specimen collection/handling, submission of specimen other than nasopharyngeal swab, presence of viral mutation(s) within the areas targeted by this assay, and inadequate number of viral copies(<138 copies/mL). A negative result must be combined with clinical observations, patient history, and epidemiological information. The expected result is Negative.  Fact Sheet for Patients:  EntrepreneurPulse.com.au  Fact Sheet for Healthcare Providers:  IncredibleEmployment.be  This test is  no t yet approved or cleared by the [place] FDA and  has been authorized for detection and/or diagnosis of SARS-CoV-2 by FDA under an Emergency Use Authorization (EUA). This EUA will remain  in effect (meaning this test can be used) for the duration of the COVID-19 declaration under Section 564(b)(1) of the Act, 21 U.S.C.section 360bbb-3(b)(1), unless the authorization is terminated  or revoked sooner.       Influenza A by PCR NEGATIVE NEGATIVE Final   Influenza B by PCR NEGATIVE NEGATIVE Final    Comment: (NOTE) The Xpert Xpress SARS-CoV-2/FLU/RSV plus assay is intended as an aid in the diagnosis of influenza from Nasopharyngeal swab specimens and should not be used as a sole basis for treatment. Nasal washings and aspirates are unacceptable for Xpert Xpress SARS-CoV-2/FLU/RSV testing.  Fact Sheet for Patients: EntrepreneurPulse.com.au  Fact Sheet for Healthcare Providers: IncredibleEmployment.be  This test is not yet approved or cleared by the [place] FDA and has been authorized for detection and/or diagnosis of SARS-CoV-2 by FDA under an Emergency Use Authorization (EUA). This EUA will remain in effect (meaning this test can be used) for the duration of the COVID-19 declaration under Section 564(b)(1) of the Act, 21 U.S.C. section 360bbb-3(b)(1), unless the authorization is terminated or revoked.  Performed at [facility], [address] [place], [place] [address]   MRSA PCR Screening     Status: None   Collection Time: 09/24/20 11:24 PM   Specimen: Nasopharyngeal  Result Value Ref Range Status   MRSA by PCR NEGATIVE NEGATIVE Final    Comment:        The GeneXpert MRSA Assay (FDA approved for NASAL specimens only), is one component of a comprehensive MRSA colonization surveillance program. It is not intended to diagnose MRSA infection nor to guide or monitor treatment for MRSA infections. Performed at  [facility], [place] [address], [place],  [address]     Procedures and  diagnostic studies:  DG C-Arm 1-60 Min  Result Date: 09/25/2020 CLINICAL DATA:  Left IM nail EXAM: DG C-ARM 1-60 MIN CONTRAST:  None FLUOROSCOPY TIME:  Fluoroscopy Time:  2 minutes 52 seconds Number of Acquired Spot Images: 7 COMPARISON:  09/24/2020 FINDINGS: Seven low resolution intraoperative spot views of the left femur are submitted postoperatively. The initial images demonstrate pathologic fracture through the proximal left femur. There is subsequent intramedullary rod with proximal and distal screw fixation of the left femur. IMPRESSION: Intraoperative fluoroscopic assistance provided during intramedullary rod fixation of left femur fracture. Electronically Signed   By: Donavan Foil M.D.   On: 09/25/2020 15:43   DG HIP OPERATIVE UNILAT W OR W/O PELVIS LEFT  Result Date: 09/25/2020 CLINICAL DATA:  Left proximal femur fracture repair. EXAM: OPERATIVE LEFT HIP (WITH PELVIS IF PERFORMED) 7 VIEWS TECHNIQUE: Fluoroscopic spot image(s) were submitted for interpretation post-operatively. FLUOROSCOPY TIME:  2 minutes and 52 seconds. COMPARISON:  September 24, 2020 FINDINGS: Screws were placed through the femoral neck and an intramedullary rod is placed through the femur with 2 distal interlocking screws. IMPRESSION: Left proximal femur fracture repair as above. Electronically Signed   By: Dorise Bullion III M.D   On: 09/25/2020 15:57   DG FEMUR PORT MIN 2 VIEWS LEFT  Result Date: 09/25/2020 CLINICAL DATA:  Postop EXAM: LEFT FEMUR PORTABLE 2 VIEWS COMPARISON:  09/24/2020 FINDINGS: Interval intramedullary rod and screw fixation of the left femur for pathologic left lower trochanteric and subtrochanteric femoral fracture. Near anatomic alignment. Gas in the soft tissues consistent with recent surgery. IMPRESSION: Interval intramedullary rod and screw fixation of pathologic proximal left femoral fracture. Electronically  Signed   By: Donavan Foil M.D.   On: 09/25/2020 15:44               LOS: 2 days   Hiawatha Dressel  [facility]   Pager on [email or web address]. If 7PM-7AM, please contact night-coverage at [email or web address]     09/26/2020, 3:24 PM

## 2020-09-26 NOTE — Plan of Care (Signed)
  Problem: Education: Goal: Knowledge of General Education information will improve Description: Including pain rating scale, medication(s)/side effects and non-pharmacologic comfort measures Outcome: Progressing   Problem: Health Behavior/Discharge Planning: Goal: Ability to manage health-related needs will improve Outcome: Progressing   Problem: Clinical Measurements: Goal: Ability to maintain clinical measurements within normal limits will improve Outcome: Progressing Goal: Will remain free from infection Outcome: Progressing Goal: Diagnostic test results will improve Outcome: Progressing Goal: Respiratory complications will improve Outcome: Progressing Goal: Cardiovascular complication will be avoided Outcome: Progressing   Problem: Activity: Goal: Risk for activity intolerance will decrease Outcome: Progressing   Problem: Nutrition: Goal: Adequate nutrition will be maintained Outcome: Progressing   Problem: Coping: Goal: Level of anxiety will decrease Outcome: Progressing   Problem: Elimination: Goal: Will not experience complications related to bowel motility Outcome: Progressing Goal: Will not experience complications related to urinary retention Outcome: Progressing   Problem: Pain Managment: Goal: General experience of comfort will improve Outcome: Progressing   Problem: Safety: Goal: Ability to remain free from injury will improve Outcome: Progressing   Problem: Skin Integrity: Goal: Risk for impaired skin integrity will decrease Outcome: Progressing   Problem: Education: Goal: Knowledge of General Education information will improve Description: Including pain rating scale, medication(s)/side effects and non-pharmacologic comfort measures Outcome: Progressing   Problem: Health Behavior/Discharge Planning: Goal: Ability to manage health-related needs will improve Outcome: Progressing   Problem: Clinical Measurements: Goal: Ability to maintain  clinical measurements within normal limits will improve Outcome: Progressing Goal: Will remain free from infection Outcome: Progressing Goal: Diagnostic test results will improve Outcome: Progressing Goal: Respiratory complications will improve Outcome: Progressing Goal: Cardiovascular complication will be avoided Outcome: Progressing   Problem: Activity: Goal: Risk for activity intolerance will decrease Outcome: Progressing   Problem: Nutrition: Goal: Adequate nutrition will be maintained Outcome: Progressing   Problem: Coping: Goal: Level of anxiety will decrease Outcome: Progressing   Problem: Elimination: Goal: Will not experience complications related to bowel motility Outcome: Progressing Goal: Will not experience complications related to urinary retention Outcome: Progressing   Problem: Pain Managment: Goal: General experience of comfort will improve Outcome: Progressing   Problem: Safety: Goal: Ability to remain free from injury will improve Outcome: Progressing   Problem: Skin Integrity: Goal: Risk for impaired skin integrity will decrease Outcome: Progressing   Problem: Education: Goal: Required Educational Video(s) Outcome: Progressing   Problem: Clinical Measurements: Goal: Ability to maintain clinical measurements within normal limits will improve Outcome: Progressing Goal: Postoperative complications will be avoided or minimized Outcome: Progressing   Problem: Skin Integrity: Goal: Demonstration of wound healing without infection will improve Outcome: Progressing

## 2020-09-26 NOTE — Progress Notes (Signed)
Initial Nutrition Assessment  DOCUMENTATION CODES:   Obesity unspecified  INTERVENTION:   -Glucerna Shake po TID, each supplement provides 220 kcal and 10 grams of protein -MVI with minerals daily  NUTRITION DIAGNOSIS:   Increased nutrient needs related to post-op healing as evidenced by estimated needs.  GOAL:   Patient will meet greater than or equal to 90% of their needs  MONITOR:   PO intake, Supplement acceptance, Labs, Weight trends, Skin, I & O's  REASON FOR ASSESSMENT:   Consult Assessment of nutrition requirement/status, Hip fracture protocol  ASSESSMENT:   Andrew Dominguez is a 64 y.o. male with medical history significant for chronic A. fib on Eliquis, essential hypertension, type 2 diabetes, diabetic polyneuropathy, known lytic lesions, unintentional weight loss, COPD, pulmonary nodules, liver masses, splenic mass, left kidney mass, with concern for metastatic lung cancer.  Patient presents with left hip pain with onset 1 month ago and gradually worsening.  Pt admitted with acute patholgocial lt hip fracture.   11/21- s/p Procedure(s): INTRAMEDULLARY (IM) NAIL HIP SCREW  Reviewed I/O's: -970 ml x 24 hours and -1.9 L since admission  UOP: 3 L x 24 hours  Spoke with pt and daughter at bedside. Pt endorses a general decline in health over the past 4-5 months due to multiple recurring health issues. Pt explains that he feels like he has had to deal with one health issue after the other. Per pt, he had not eaten for 3 days PTA due to pain and not felling well. He typically has a good appetite, consuming 2 meals per day. Pt endorses that he has been trying to make healthier choices and has been consuming a lot of proteins and vegetables lately. He has also cut back on snacks, such as Little Debbie cakes.   Per pt, he estimates that he has lost about 50 pounds in 4 months. He believes that weight loss is related to lifestyle changes. Noted pt has experienced a 4.2% wt  loss over the past month, which is not significant for time frame.   Discussed with pt importance of good meal and supplement intake to promote healing.   Medications reviewed and include colace.   Lab Results  Component Value Date   HGBA1C 5.4 09/25/2020   PTA DM medications are 25 mg empagliflozin daily and 1000 mg metformin BID.   Labs reviewed: K: 3.4, CBGS: 83-132 (inpatient orders for glycemic control are 0-9 units insulin aspart every 4 hours).   NUTRITION - FOCUSED PHYSICAL EXAM:    Most Recent Value  Orbital Region No depletion  Upper Arm Region No depletion  Thoracic and Lumbar Region No depletion  Buccal Region No depletion  Temple Region No depletion  Clavicle Bone Region No depletion  Clavicle and Acromion Bone Region No depletion  Scapular Bone Region No depletion  Dorsal Hand No depletion  Patellar Region No depletion  Anterior Thigh Region No depletion  Posterior Calf Region No depletion  Edema (RD Assessment) Mild  Hair Reviewed  Eyes Reviewed  Mouth Reviewed  Skin Reviewed  Nails Reviewed       Diet Order:   Diet Order            Diet Carb Modified Fluid consistency: Thin; Room service appropriate? Yes  Diet effective now                 EDUCATION NEEDS:   Education needs have been addressed  Skin:  Skin Assessment: Skin Integrity Issues: Skin Integrity Issues:: Incisions Incisions:  closed lt hip  Last BM:  09/24/20  Height:   Ht Readings from Last 1 Encounters:  09/25/20 6' 0.01" (1.829 m)    Weight:   Wt Readings from Last 1 Encounters:  09/25/20 128.8 kg   BMI:  Body mass index is 38.5 kg/m.  Estimated Nutritional Needs:   Kcal:  2000-2200  Protein:  105-120 grams  Fluid:  > 2 L    Loistine Chance, RD, LDN, [place] Registered Dietitian II Certified Diabetes Care and Education Specialist Please refer to [facility] for RD and/or RD on-call/weekend/after hours pager

## 2020-09-26 NOTE — Progress Notes (Addendum)
  Subjective: Andrew Dominguez is a 64 y.o. male s/p left Hip IM nail.  They are POD1.  Pt's pain is controlled.  He is NWB.    Objective: Vital signs in last 24 hours: Temp:  [97.6 F (36.4 C)-99.5 F (37.5 C)] 99.3 F (37.4 C) (11/22 1410) Pulse Rate:  [92-105] 105 (11/22 1410) Resp:  [16-18] 18 (11/22 1410) BP: (107-116)/(65-72) 111/72 (11/22 1410) SpO2:  [95 %-97 %] 97 % (11/22 1410)  Intake/Output from previous day: 11/21 0701 - 11/22 0700 In: 2054.6 [P.O.:635; I.V.:1419.6] Out: 3025 [Urine:2975; Blood:50] Intake/Output this shift: Total I/O In: 280 [P.O.:280] Out: 1900 [Urine:1900]  Exam:  No gross blood or drainage overlying the dressing Left foot warm and well perfused.   Sensation intact distally in the left foot Able to dorsiflex and plantarflex the left foot.  Leg lengths equal   Labs: Recent Labs    09/24/20 1154 09/25/20 0316 09/26/20 0256  HGB 12.3* 11.4* 10.3*   Recent Labs    09/25/20 0316 09/26/20 0256  WBC 6.4 6.5  RBC 4.40 4.06*  HCT 36.8* 33.5*  PLT 214 182   Recent Labs    09/25/20 0316 09/26/20 0256  NA 139 138  K 4.0 3.4*  CL 102 100  CO2 25 24  BUN 22 21  CREATININE 1.19 1.18  GLUCOSE 100* 136*  CALCIUM 9.6 9.0   No results for input(s): LABPT, INR in the last 72 hours.  Assessment/Plan: Pt is POD1 s/p left hip IM nail.    -Disposition pending medical team clearance and decision  -Surgical pathology pending  -NWB to LLE  -DVT Prophylaxis: Resume Eliquis   Donella Stade 09/26/2020, 4:48 PM

## 2020-09-27 ENCOUNTER — Other Ambulatory Visit: Payer: Self-pay

## 2020-09-27 ENCOUNTER — Inpatient Hospital Stay (HOSPITAL_COMMUNITY): Payer: 59

## 2020-09-27 DIAGNOSIS — R509 Fever, unspecified: Secondary | ICD-10-CM | POA: Diagnosis not present

## 2020-09-27 DIAGNOSIS — C787 Secondary malignant neoplasm of liver and intrahepatic bile duct: Secondary | ICD-10-CM

## 2020-09-27 DIAGNOSIS — R918 Other nonspecific abnormal finding of lung field: Secondary | ICD-10-CM

## 2020-09-27 DIAGNOSIS — I4891 Unspecified atrial fibrillation: Secondary | ICD-10-CM

## 2020-09-27 LAB — BASIC METABOLIC PANEL
Anion gap: 12 (ref 5–15)
BUN: 18 mg/dL (ref 8–23)
CO2: 25 mmol/L (ref 22–32)
Calcium: 8.8 mg/dL — ABNORMAL LOW (ref 8.9–10.3)
Chloride: 100 mmol/L (ref 98–111)
Creatinine, Ser: 0.99 mg/dL (ref 0.61–1.24)
GFR, Estimated: >60 mL/min (ref >60)
Glucose, Bld: 118 mg/dL — ABNORMAL HIGH (ref 70–99)
Potassium: 4.2 mmol/L (ref 3.5–5.1)
Sodium: 137 mmol/L (ref 135–145)

## 2020-09-27 LAB — GLUCOSE, CAPILLARY
Glucose-Capillary: 102 mg/dL — ABNORMAL HIGH (ref 70–99)
Glucose-Capillary: 102 mg/dL — ABNORMAL HIGH (ref 70–99)
Glucose-Capillary: 111 mg/dL — ABNORMAL HIGH (ref 70–99)
Glucose-Capillary: 141 mg/dL — ABNORMAL HIGH (ref 70–99)
Glucose-Capillary: 157 mg/dL — ABNORMAL HIGH (ref 70–99)
Glucose-Capillary: 161 mg/dL — ABNORMAL HIGH (ref 70–99)

## 2020-09-27 LAB — CBC
HCT: 33.3 % — ABNORMAL LOW (ref 39.0–52.0)
Hemoglobin: 10.2 g/dL — ABNORMAL LOW (ref 13.0–17.0)
MCH: 25.8 pg — ABNORMAL LOW (ref 26.0–34.0)
MCHC: 30.6 g/dL (ref 30.0–36.0)
MCV: 84.3 fL (ref 80.0–100.0)
Platelets: 178 10*3/uL (ref 150–400)
RBC: 3.95 MIL/uL — ABNORMAL LOW (ref 4.22–5.81)
RDW: 13.6 % (ref 11.5–15.5)
WBC: 7.1 10*3/uL (ref 4.0–10.5)
nRBC: 0 % (ref 0.0–0.2)

## 2020-09-27 LAB — PROCALCITONIN: Procalcitonin: 0.13 ng/mL

## 2020-09-27 LAB — LACTIC ACID, PLASMA: Lactic Acid, Venous: 1.9 mmol/L (ref 0.5–1.9)

## 2020-09-27 MED ORDER — DILTIAZEM LOAD VIA INFUSION
15.0000 mg | Freq: Once | INTRAVENOUS | Status: AC
  Administered 2020-09-27: 15 mg via INTRAVENOUS
  Filled 2020-09-27: qty 15

## 2020-09-27 MED ORDER — ACETAMINOPHEN 325 MG PO TABS
650.0000 mg | ORAL_TABLET | Freq: Four times a day (QID) | ORAL | Status: DC | PRN
  Administered 2020-09-27 – 2020-09-28 (×3): 650 mg via ORAL
  Filled 2020-09-27 (×3): qty 2

## 2020-09-27 MED ORDER — METOPROLOL TARTRATE 5 MG/5ML IV SOLN
2.5000 mg | Freq: Once | INTRAVENOUS | Status: AC
  Administered 2020-09-27: 2.5 mg via INTRAVENOUS
  Filled 2020-09-27: qty 5

## 2020-09-27 MED ORDER — DILTIAZEM HCL-DEXTROSE 125-5 MG/125ML-% IV SOLN (PREMIX)
5.0000 mg/h | INTRAVENOUS | Status: DC
  Administered 2020-09-27 – 2020-09-28 (×2): 5 mg/h via INTRAVENOUS
  Filled 2020-09-27 (×5): qty 125

## 2020-09-27 MED ORDER — DIGOXIN 125 MCG PO TABS
0.1250 mg | ORAL_TABLET | Freq: Once | ORAL | Status: AC
  Administered 2020-09-27: 0.125 mg via ORAL
  Filled 2020-09-27: qty 1

## 2020-09-27 NOTE — Significant Event (Addendum)
Rapid Response Event Note   Reason for Call :  MEWS: T 101.33F, BP 123/69, HR 139, RR 17, SpO2 94% on room air  Initial Focused Assessment:  Pt lying in bed. He is alert, oriented, appears to be in no distress. Pt transferred to PCU for atrial fibrillation and new orders for a Cardizem gtt. Pt is warm, diaphoretic. Rectal temperature checked and was 101.33F. Pt endorses he is "warm blooded", but has felt very warm overnight and into this morning. Pt s/p left hip IM nailing for a left hip fracture. Site has a bandage over it. Bandage appears clean, dry. Surrounding skin is intact with no signs of redness or swelling. Pt denies pain.   Interventions:  -Ice packs -RN notifying provider for PRN antipyretic  Plan of Care:  -Manage fever, reevaluate temperature following treatment -Cardizem gtt -Encourage oral intake, fluids  Call rapid response for additional needs  Event Summary:  MD Notified: Dr. Mal Misty Arrival Time: [id number] End Time: [place], RN

## 2020-09-27 NOTE — Progress Notes (Signed)
Note generated and faxed.

## 2020-09-27 NOTE — Progress Notes (Signed)
Per Lurena Joiner note needed for patient stating patient is currently unable to work due to injury requiring hospitalization. Also need note faxed to [phone]

## 2020-09-27 NOTE — Progress Notes (Signed)
OT Cancellation Note  Patient Details Name: BRYSON GAVIA MRN: [id number] DOB: 02/09/1956   Cancelled Treatment:    Reason Eval/Treat Not Completed: Patient not medically ready (Pt with rapid response with uncontrolled HR. RN hold.)  OT to continue to follow for OT eval.   Jefferey Pica, OTR/L Acute Rehabilitation Services Pager: [phone] Office: [phone]   Megyn Leng C 09/27/2020, 1:17 PM

## 2020-09-27 NOTE — Progress Notes (Addendum)
Progress Note    Andrew Dominguez  NAT:[id number] DOB: Oct 06, 1956  DOA: 09/24/2020 PCP: Baxter Hire, MD      Brief Narrative:    Medical records reviewed and are as summarized below:  Andrew Dominguez is a 64 y.o. male with medical history significant for chronic A. fib on Eliquis, essential hypertension, type 2 diabetes, diabetic polyneuropathy, known lytic lesions, unintentional weight loss, COPD, pulmonary nodules, liver masses, splenic mass, left kidney mass, with concern for metastatic lung cancer.  He presented to the hospital with severe left hip pain.  Pain had been going on for about a month but it has progressively gotten worse over time.  He was found to have acute left hip pathological fracture.  He was evaluated by orthopedic surgeon and he underwent intramedullary nail hip screw on 09/25/2020.      Assessment/Plan:   Active Problems:   Liver metastases (HCC)   Lung nodules   S/p left hip fracture   Atrial fibrillation with RVR (HCC)   Fever   Body mass index is 38.5 kg/m.  (Morbid obesity)    Left hip pathologic fracture:s/p intramedullary nail hip screw 09/25/2020.  Continue analgesics as needed for pain.  Follow-up with orthopedic surgeon.  Continue PT and OT.  Atrial fibrillation with RVR: Patient was transferred to stepdown unit.  He was given IV Cardizem bolus followed by IV Cardizem infusion.  Taper off Cardizem infusion as able.  Fever: Etiology unclear.  Lactic acid and procalcitonin level are normal.  Blood culture has been ordered.  Chest x-ray ordered today showed 2 to 3 cm indistinct densities lateral to the right hilum which could be an infiltrate or mass.  Of note, patient has baseline pulmonary nodules.  His cough could be from recent intubation for surgery although he could be developing pneumonia.  Monitor fever curve.  No antibiotics for now but initiate empiric IV antibiotics if fever recurs..  Suspected metastatic lung cancer:  Outpatient follow-up with oncologist.  Plan for  biopsy on 10/04/2020.  Eliquis to be held prior to biopsy.  Hypokalemia: Improved  Type II DM with hyperglycemia: Hemoglobin A1c is 5.4.  COPD: Stable.  Continue bronchodilators as needed.  Recent bedbug infestation: Patient said his house was treated on 09/14/2020.  Other comorbidities include diabetic polyneuropathy, hypertension, hyperlipidemia  Plan of care was discussed with his wife at the bedside.  Diet Order            Diet Carb Modified Fluid consistency: Thin; Room service appropriate? Yes  Diet effective now                    Consultants:  Orthopedic surgeon  Procedures:  Intramedullary nail hip screw on 09/26/2011    Medications:   . apixaban  5 mg Oral BID  . atenolol  12.5 mg Oral Daily  . docusate sodium  100 mg Oral BID  . feeding supplement (GLUCERNA SHAKE)  237 mL Oral TID BM  . fenofibrate  54 mg Oral Daily  . gabapentin  100 mg Oral TID  . insulin aspart  0-9 Units Subcutaneous Q4H  . multivitamin with minerals  1 tablet Oral Daily  . rosuvastatin  5 mg Oral Daily  . umeclidinium-vilanterol  1 puff Inhalation Daily   Continuous Infusions: . diltiazem (CARDIZEM) infusion 5 mg/hr (09/27/20 1144)  . methocarbamol (ROBAXIN) IV       Anti-infectives (From admission, onward)   Start     Dose/Rate Route  Frequency Ordered Stop   09/25/20 1700  ceFAZolin (ANCEF) IVPB 2g/100 mL premix        2 g 200 mL/hr over 30 Minutes Intravenous Every 8 hours 09/25/20 1253 09/26/20 0040   09/25/20 0800  ceFAZolin (ANCEF) 3 g in dextrose 5 % 50 mL IVPB        3 g 100 mL/hr over 30 Minutes Intravenous To Short Stay 09/25/20 3419 09/25/20 0911             Family Communication/Anticipated D/C date and plan/Code Status   DVT prophylaxis: SCDs Start: 09/25/20 1254 Place TED hose Start: 09/25/20 1254 SCDs Start: 09/24/20 2038 apixaban (ELIQUIS) tablet 5 mg     Code Status: Full Code  Family  Communication: Plan discussed with his wife Disposition Plan:    Status is: Inpatient  Remains inpatient appropriate because:Unsafe d/c plan   Dispo:  Patient From: Home  Planned Disposition: Home with Health Care Svc  Expected discharge date: 09/30/20  Medically stable for discharge: No            Subjective:   Interval events noted.  He had a fever with T-max of 101.1 F.  He also went into rapid A. fib with heart rate in the 150s.  He complains of cough since he had surgery.  Cough is occasionally productive of sputum but he is not sure whether color is.  Objective:    Vitals:   09/27/20 1100 09/27/20 1200 09/27/20 1300 09/27/20 1400  BP: 123/69 115/73 119/69 130/73  Pulse: (!) 134 (!) 102 96 100  Resp: 17 18 19 19   Temp: (!) 101.2 F (38.4 C) (!) 101.2 F (38.4 C) (!) 101.1 F (38.4 C) (!) 100.9 F (38.3 C)  TempSrc: Rectal Rectal Rectal Rectal  SpO2: 94% 96% 97% 96%  Weight:      Height:       No data found.   Intake/Output Summary (Last 24 hours) at 09/27/2020 1521 Last data filed at 09/27/2020 1408 Gross per 24 hour  Intake 1397 ml  Output 3600 ml  Net -2203 ml   Filed Weights   09/25/20 0500 09/25/20 1409  Weight: 128.8 kg 128.8 kg    Exam:   GEN: NAD SKIN: Warm and dry EYES: No pallor or icterus ENT: MMM CV: RRR PULM: CTA B ABD: soft, obese, NT, +BS CNS: AAO x 3, non focal EXT: Dressing on surgical wounds on the left lateral bilateral thighs are clean and dry.      Data Reviewed:   I have personally reviewed following labs and imaging studies:  Labs: Labs show the following:   Basic Metabolic Panel: Recent Labs  Lab 09/24/20 1154 09/24/20 1154 09/25/20 0316 09/25/20 0316 09/26/20 0256 09/27/20 0152  NA 136  --  139  --  138 137  K 3.6   < > 4.0   < > 3.4* 4.2  CL 101  --  102  --  100 100  CO2 21*  --  25  --  24 25  GLUCOSE 169*  --  100*  --  136* 118*  BUN 29*  --  22  --  21 18  CREATININE 1.24  --  1.19   --  1.18 0.99  CALCIUM 9.6  --  9.6  --  9.0 8.8*  MG  --   --  1.7  --  1.8  --    < > = values in this interval not displayed.   GFR Estimated Creatinine  Clearance: 104.6 mL/min (by C-G formula based on SCr of 0.99 mg/dL). Liver Function Tests: Recent Labs  Lab 09/24/20 1154  AST 15  ALT 12  ALKPHOS 56  BILITOT 0.9  PROT 7.5  ALBUMIN 3.9   No results for input(s): LIPASE, AMYLASE in the last 168 hours. No results for input(s): AMMONIA in the last 168 hours. Coagulation profile No results for input(s): INR, PROTIME in the last 168 hours.  CBC: Recent Labs  Lab 09/24/20 1154 09/25/20 0316 09/26/20 0256 09/27/20 0152  WBC 7.7 6.4 6.5 7.1  NEUTROABS 5.4  --   --   --   HGB 12.3* 11.4* 10.3* 10.2*  HCT 39.1 36.8* 33.5* 33.3*  MCV 81.5 83.6 82.5 84.3  PLT 248 214 182 178   Cardiac Enzymes: No results for input(s): CKTOTAL, CKMB, CKMBINDEX, TROPONINI in the last 168 hours. BNP (last 3 results) No results for input(s): PROBNP in the last 8760 hours. CBG: Recent Labs  Lab 09/26/20 2034 09/27/20 0006 09/27/20 0424 09/27/20 0809 09/27/20 1307  GLUCAP 175* 102* 102* 111* 141*   D-Dimer: No results for input(s): DDIMER in the last 72 hours. Hgb A1c: Recent Labs    09/25/20 0316  HGBA1C 5.4   Lipid Profile: No results for input(s): CHOL, HDL, LDLCALC, TRIG, CHOLHDL, LDLDIRECT in the last 72 hours. Thyroid function studies: No results for input(s): TSH, T4TOTAL, T3FREE, THYROIDAB in the last 72 hours.  Invalid input(s): FREET3 Anemia work up: No results for input(s): VITAMINB12, FOLATE, FERRITIN, TIBC, IRON, RETICCTPCT in the last 72 hours. Sepsis Labs: Recent Labs  Lab 09/24/20 1154 09/24/20 1350 09/25/20 0316 09/26/20 0256 09/27/20 0152 09/27/20 1200  PROCALCITON  --   --   --   --   --  0.13  WBC 7.7  --  6.4 6.5 7.1  --   LATICACIDVEN  --  1.8  --   --   --  1.9    Microbiology Recent Results (from the past 240 hour(s))  Resp Panel by RT-PCR  (Flu A&B, Covid) Nasopharyngeal Swab     Status: None   Collection Time: 09/24/20  3:38 PM   Specimen: Nasopharyngeal Swab; Nasopharyngeal(NP) swabs in vial transport medium  Result Value Ref Range Status   SARS Coronavirus 2 by RT PCR NEGATIVE NEGATIVE Final    Comment: (NOTE) SARS-CoV-2 target nucleic acids are NOT DETECTED.  The SARS-CoV-2 RNA is generally detectable in upper respiratory specimens during the acute phase of infection. The lowest concentration of SARS-CoV-2 viral copies this assay can detect is 138 copies/mL. A negative result does not preclude SARS-Cov-2 infection and should not be used as the sole basis for treatment or other patient management decisions. A negative result may occur with  improper specimen collection/handling, submission of specimen other than nasopharyngeal swab, presence of viral mutation(s) within the areas targeted by this assay, and inadequate number of viral copies(<138 copies/mL). A negative result must be combined with clinical observations, patient history, and epidemiological information. The expected result is Negative.  Fact Sheet for Patients:  EntrepreneurPulse.com.au  Fact Sheet for Healthcare Providers:  IncredibleEmployment.be  This test is no t yet approved or cleared by the [place] FDA and  has been authorized for detection and/or diagnosis of SARS-CoV-2 by FDA under an Emergency Use Authorization (EUA). This EUA will remain  in effect (meaning this test can be used) for the duration of the COVID-19 declaration under Section 564(b)(1) of the Act, 21 U.S.C.section 360bbb-3(b)(1), unless the authorization is terminated  or  revoked sooner.       Influenza A by PCR NEGATIVE NEGATIVE Final   Influenza B by PCR NEGATIVE NEGATIVE Final    Comment: (NOTE) The Xpert Xpress SARS-CoV-2/FLU/RSV plus assay is intended as an aid in the diagnosis of influenza from Nasopharyngeal swab specimens  and should not be used as a sole basis for treatment. Nasal washings and aspirates are unacceptable for Xpert Xpress SARS-CoV-2/FLU/RSV testing.  Fact Sheet for Patients: EntrepreneurPulse.com.au  Fact Sheet for Healthcare Providers: IncredibleEmployment.be  This test is not yet approved or cleared by the [place] FDA and has been authorized for detection and/or diagnosis of SARS-CoV-2 by FDA under an Emergency Use Authorization (EUA). This EUA will remain in effect (meaning this test can be used) for the duration of the COVID-19 declaration under Section 564(b)(1) of the Act, 21 U.S.C. section 360bbb-3(b)(1), unless the authorization is terminated or revoked.  Performed at [facility], [address] [place], [place] [address]   MRSA PCR Screening     Status: None   Collection Time: 09/24/20 11:24 PM   Specimen: Nasopharyngeal  Result Value Ref Range Status   MRSA by PCR NEGATIVE NEGATIVE Final    Comment:        The GeneXpert MRSA Assay (FDA approved for NASAL specimens only), is one component of a comprehensive MRSA colonization surveillance program. It is not intended to diagnose MRSA infection nor to guide or monitor treatment for MRSA infections. Performed at [facility], [place] [address] [place], [place] [address]     Procedures and diagnostic studies:  DG CHEST PORT 1 VIEW  Result Date: 09/27/2020 CLINICAL DATA:  Fever EXAM: PORTABLE CHEST 1 VIEW COMPARISON:  09/24/2020.  CT 09/01/2020. FINDINGS: Heart size is normal. Mediastinal shadows are normal. 2-3 cm indistinct density seen lateral to the right hilum could be an infiltrate or mass. Most of the abnormalities shown on the recent chest CT cannot be seen by radiography. IMPRESSION: 2-3 cm indistinct density lateral to the right hilum could be an infiltrate or mass. Most of the abnormalities shown on the recent chest CT cannot be seen by radiography.  Electronically Signed   By: Nelson Chimes M.D.   On: 09/27/2020 14:09               LOS: 3 days   Adriona Kaney  [facility]   Pager on [email or web address]. If 7PM-7AM, please contact night-coverage at [email or web address]     09/27/2020, 3:21 PM

## 2020-09-27 NOTE — Progress Notes (Signed)
   09/27/20 0850  Notify: Provider  Provider Name/Title Jennye Boroughs  Date Provider Notified 09/27/20  Time Provider Notified [phone]  Notification Type Call  Notification Reason Change in status  Response See new orders  Date of Provider Response 09/27/20  Time of Provider Response 0900  Document  Patient Outcome Transferred/level of care increased  Progress note created (see row info) Yes  Afib RVR, MD notified, pt asymptomatic, orders for Cardizem bolus and drip, transferred to Intermediate Unit.

## 2020-09-27 NOTE — Progress Notes (Signed)
°   09/27/20 1100  Assess: MEWS Score  Temp (!) 101.2 F (38.4 C)  BP 123/69  Pulse Rate (!) 134  ECG Heart Rate (!) 139  Resp 17  Level of Consciousness Alert  SpO2 94 %  O2 Device Room Air  Assess: MEWS Score  MEWS Temp 1  MEWS Systolic 0  MEWS Pulse 3  MEWS RR 0  MEWS LOC 0  MEWS Score 4  MEWS Score Color Red  Assess: if the MEWS score is Yellow or Red  Were vital signs taken at a resting state? Yes  Focused Assessment Change from prior assessment (see assessment flowsheet)  Early Detection of Sepsis Score *See Row Information* High  MEWS guidelines implemented *See Row Information* Yes  Treat  MEWS Interventions Administered scheduled meds/treatments;Administered prn meds/treatments;Escalated (See documentation below)  Pain Scale 0-10  Pain Score 4  Pain Type Acute pain  Pain Location Hip  Pain Orientation Left  Take Vital Signs  Increase Vital Sign Frequency  Red: Q 1hr X 4 then Q 4hr X 4, if remains red, continue Q 4hrs  Escalate  MEWS: Escalate Red: discuss with charge nurse/RN and provider, consider discussing with RRT  Notify: Charge Nurse/RN  Name of Charge Nurse/RN Notified Chrys Racer RN  Date Charge Nurse/RN Notified 09/27/20  Time Charge Nurse/RN Notified 1030  Notify: Provider  Provider Name/Title Ayiku  Date Provider Notified 09/27/20  Time Provider Notified 1030  Notification Type Page  Notification Reason Change in status  Response Other (Comment)  Date of Provider Response 09/27/20  Time of Provider Response 1111  Notify: Rapid Response  Name of Rapid Response RN Notified [facility] RN  Date Rapid Response Notified 09/27/20  Time Rapid Response Notified 9937   See other note 1696.

## 2020-09-27 NOTE — Progress Notes (Signed)
PT Cancellation Note  Patient Details Name: Andrew Dominguez MRN: [id number] DOB: Apr 03, 1956   Cancelled Treatment:    Reason Eval/Treat Not Completed: Medical issues which prohibited therapy Patient with rapid response with uncontrolled HR. RN hold. PT will re-attempt session as time allows.   Perrin Maltese, PT, DPT Acute Rehabilitation Services Pager [phone] Office [phone]    Alda Lea 09/27/2020, 2:36 PM

## 2020-09-27 NOTE — Progress Notes (Signed)
Patient has responded well to the Cardizem drip. No titration needed. Heart rate is stable Afib at 96-100bpm. Patient is resting well in bed with no complaints. Will continue to monitor.

## 2020-09-27 NOTE — Progress Notes (Signed)
  Subjective: Andrew Dominguez is a 64 y.o. male s/p left Hip IM nail.  They are POD2.  Pt's pain is controlled.  He is NWB.  Patient is experiencing fever and has been transferred to new unit due to Afib with RVR.  Denies any chest discomfort, SOB, palpitations.  No significant increase in hip pain.  Objective: Vital signs in last 24 hours: Temp:  [98.1 F (36.7 C)-101.2 F (38.4 C)] 98.7 F (37.1 C) (11/23 1600) Pulse Rate:  [96-137] 96 (11/23 1600) Resp:  [14-19] 14 (11/23 1600) BP: (115-136)/(66-81) 127/74 (11/23 1600) SpO2:  [90 %-97 %] 90 % (11/23 1600)  Intake/Output from previous day: 11/22 0701 - 11/23 0700 In: 1340 [P.O.:1340] Out: 3300 [Urine:3300] Intake/Output this shift: Total I/O In: 708.1 [P.O.:677; I.V.:31.1] Out: 1700 [Urine:1700]  Exam:  No gross blood or drainage overlying the dressing Left foot warm and well perfused.   Sensation intact distally in the left foot Able to dorsiflex and plantarflex the left foot.  Leg lengths equal   Labs: Recent Labs    09/25/20 0316 09/26/20 0256 09/27/20 0152  HGB 11.4* 10.3* 10.2*   Recent Labs    09/26/20 0256 09/27/20 0152  WBC 6.5 7.1  RBC 4.06* 3.95*  HCT 33.5* 33.3*  PLT 182 178   Recent Labs    09/26/20 0256 09/27/20 0152  NA 138 137  K 3.4* 4.2  CL 100 100  CO2 24 25  BUN 21 18  CREATININE 1.18 0.99  GLUCOSE 136* 118*  CALCIUM 9.0 8.8*   No results for input(s): LABPT, INR in the last 72 hours.  Assessment/Plan: Pt is POD2 s/p left hip IM nail.    -Disposition pending medical team clearance and decision  -Submitted work note to patient's HR to state he is unable to return to work at this time  -Surgical pathology pending  -NWB to LLE  -DVT Prophylaxis: SCDs and Eliquis  -Plan to f/u with Dr. Marlou Sa in clinic 10-14 days following procedure   Gerrianne Scale Miosha Behe 09/27/2020, 5:15 PM

## 2020-09-27 NOTE — Progress Notes (Signed)
Patient arrived to the floor. Patient was assessed and found to have elevated temp. Doctor, Rapid and Camera operator was notified. Orders received patient resting in bed no complaints voiced at this time. Will continue to monitor patient. See MEWS for further documentation.

## 2020-09-27 NOTE — Progress Notes (Addendum)
Patient with hx of chronic Afib on Atenolol for rate control and Eliquis. Here with Acute pathological left hip fracture s/p repair POD # 1. Prior EKG showed Afib with rate of 95 bmp. Now RN reporting sustained rate above 120 -135 beats/min. He is currently  afebrile with blood pressure 119/66 mm Hg and pulse rate 132 beats/min. There were no focal neurological deficits; he is alert and oriented.    Afib+RVR in the setting of acute pathological Fx s/p surgery Hx: Chronic Atrial Fibrillation - Prior EKG shows Afib with rate of 95 and prolonged QTc - Will give Digoxin 0.125mg  PO  X 1 dose given hypotension and unable to tolerate betablocker or CCB - If rate persistent will consider Amiodarone bolus then gtt and possible transfer to cardiac unit -Thromboembolism Risk Management, resume Eliquis per ortho - Monitor for thromboembolic event      Rufina Falco, DNP, CCRN,FNP-C, AGACNP- [facility] Nurse Practitioner  [facility]

## 2020-09-28 ENCOUNTER — Telehealth: Payer: Self-pay | Admitting: Orthopedic Surgery

## 2020-09-28 LAB — CBC
HCT: 32.6 % — ABNORMAL LOW (ref 39.0–52.0)
Hemoglobin: 9.8 g/dL — ABNORMAL LOW (ref 13.0–17.0)
MCH: 25.3 pg — ABNORMAL LOW (ref 26.0–34.0)
MCHC: 30.1 g/dL (ref 30.0–36.0)
MCV: 84 fL (ref 80.0–100.0)
Platelets: 154 10*3/uL (ref 150–400)
RBC: 3.88 MIL/uL — ABNORMAL LOW (ref 4.22–5.81)
RDW: 13.6 % (ref 11.5–15.5)
WBC: 5.8 10*3/uL (ref 4.0–10.5)
nRBC: 0 % (ref 0.0–0.2)

## 2020-09-28 LAB — BASIC METABOLIC PANEL
Anion gap: 9 (ref 5–15)
BUN: 15 mg/dL (ref 8–23)
CO2: 28 mmol/L (ref 22–32)
Calcium: 9 mg/dL (ref 8.9–10.3)
Chloride: 101 mmol/L (ref 98–111)
Creatinine, Ser: 0.92 mg/dL (ref 0.61–1.24)
GFR, Estimated: >60 mL/min (ref >60)
Glucose, Bld: 121 mg/dL — ABNORMAL HIGH (ref 70–99)
Potassium: 4 mmol/L (ref 3.5–5.1)
Sodium: 138 mmol/L (ref 135–145)

## 2020-09-28 LAB — GLUCOSE, CAPILLARY
Glucose-Capillary: 110 mg/dL — ABNORMAL HIGH (ref 70–99)
Glucose-Capillary: 128 mg/dL — ABNORMAL HIGH (ref 70–99)
Glucose-Capillary: 128 mg/dL — ABNORMAL HIGH (ref 70–99)
Glucose-Capillary: 165 mg/dL — ABNORMAL HIGH (ref 70–99)
Glucose-Capillary: 167 mg/dL — ABNORMAL HIGH (ref 70–99)
Glucose-Capillary: 172 mg/dL — ABNORMAL HIGH (ref 70–99)

## 2020-09-28 LAB — PROCALCITONIN: Procalcitonin: <0.1 ng/mL

## 2020-09-28 MED ORDER — DILTIAZEM HCL 60 MG PO TABS
60.0000 mg | ORAL_TABLET | Freq: Four times a day (QID) | ORAL | Status: DC
  Administered 2020-09-28 – 2020-09-30 (×8): 60 mg via ORAL
  Filled 2020-09-28 (×8): qty 1

## 2020-09-28 NOTE — Progress Notes (Signed)
Patient stable Dressings intact left hip Mild pain with circumduction of the left hip Nonweightbearing for least 3 weeks until return office visit.  We will check radiographs at that time Primary remains unknown Continue Eliquis for DVT prophylaxis

## 2020-09-28 NOTE — Progress Notes (Signed)
Patient was given PO Cardizem 60mg  at 1200. IV Cardizem is now Stopped. Patient is responding well to medication. Will continue to monitor.

## 2020-09-28 NOTE — Evaluation (Signed)
Occupational Therapy Evaluation Patient Details Name: Andrew Dominguez MRN: [id number] DOB: Oct 13, 1956 Today's Date: 09/28/2020    History of Present Illness Patient is a 64 y/o male with PMH of chronic Afib on Eliquis, essential HTN, type II DM, diabetic neuropathy, known lytic lesions, unintentional weight loss, COPD, pulmonary nodules, liver massess, splenic mass, left kidney mass, with concern for metastatic lung cancer. Patient presented with L hip pain with onset 1 month ago and gradually worsening. Patient seen in ED with L hip fracture with metastatic process. Patient s/p IM nail of L femur on 11/21.Transfered to progressive unit 11/23 due to Afib with RVR   Clinical Impression   This 64 yo male admitted and underwent above presents to acute OT with PLOF of being independent with all basic ADLs and mobility. Currently he is setup/S-total A for basic ADLs and requiring +2 A for all mobility. He will continue to benefit from acute OT with follow up at SNF.    Follow Up Recommendations  SNF;Supervision/Assistance - 24 hour          Precautions / Restrictions Precautions Precautions: Fall Precaution Comments: watch HR--shot up to 199 from low 130s when he stood up; started in high 110's/low 120's Restrictions Weight Bearing Restrictions: Yes LLE Weight Bearing: Non weight bearing      Mobility Bed Mobility Overal bed mobility: Needs Assistance Bed Mobility: Supine to Sit;Sit to Supine     Supine to sit: Max assist;+2 for physical assistance Sit to supine: Max assist;+2 for physical assistance   General bed mobility comments: A for legs and trunk as well as sequencing cues    Transfers Overall transfer level: Needs assistance Equipment used: Rolling walker (2 wheeled) Transfers: Sit to/from Stand Sit to Stand: Mod assist;+2 physical assistance         General transfer comment: VCs for hand placement, pt reported minimal dizziness when first sat EOB but BP was  stable    Balance Overall balance assessment: Needs assistance Sitting-balance support: No upper extremity supported;Feet supported Sitting balance-Leahy Scale: Fair     Standing balance support: Bilateral upper extremity supported Standing balance-Leahy Scale: Poor Standing balance comment: RW and additonal external support from therapists                           ADL either performed or assessed with clinical judgement   ADL Overall ADL's : Needs assistance/impaired Eating/Feeding: Independent;Sitting   Grooming: Set up;Sitting   Upper Body Bathing: Set up;Sitting   Lower Body Bathing: Total assistance Lower Body Bathing Details (indicate cue type and reason): Mod A +2 sit<>stand raised bed Upper Body Dressing : Minimal assistance;Sitting   Lower Body Dressing: Total assistance Lower Body Dressing Details (indicate cue type and reason): Mod A +2 sit<>stand raised bed   Toilet Transfer Details (indicate cue type and reason): deferred all transfers due to high HR with standing                 Vision Patient Visual Report: No change from baseline              Pertinent Vitals/Pain Pain Assessment: Faces Faces Pain Scale: Hurts whole lot (when he scooted to EOB in prep to stand)     Hand Dominance Right   Extremity/Trunk Assessment Upper Extremity Assessment Upper Extremity Assessment: Overall WFL for tasks assessed           Communication Communication Communication: No difficulties   Cognition Arousal/Alertness: Awake/alert  Behavior During Therapy: WFL for tasks assessed/performed Overall Cognitive Status: Within Functional Limits for tasks assessed                                                Home Living Family/patient expects to be discharged to:: Skilled nursing facility                                 Additional Comments: Wife has to take care of grandchildren               OT Problem  List: Decreased strength;Decreased range of motion;Decreased activity tolerance;Impaired balance (sitting and/or standing);Pain      OT Treatment/Interventions: Self-care/ADL training;DME and/or AE instruction;Patient/family education;Balance training    OT Goals(Current goals can be found in the care plan section) Acute Rehab OT Goals Patient Stated Goal: to rehab and then home OT Goal Formulation: With patient Time For Goal Achievement: 10/12/20 Potential to Achieve Goals: Good  OT Frequency: Min 2X/week   Barriers to D/C: Decreased caregiver support          Co-evaluation PT/OT/SLP Co-Evaluation/Treatment: Yes Reason for Co-Treatment: For patient/therapist safety PT goals addressed during session: Mobility/safety with mobility;Balance;Strengthening/ROM;Proper use of DME OT goals addressed during session: Strengthening/ROM      AM-PAC OT "6 Clicks" Daily Activity     Outcome Measure Help from another person eating meals?: None Help from another person taking care of personal grooming?: A Little Help from another person toileting, which includes using toliet, bedpan, or urinal?: Total Help from another person bathing (including washing, rinsing, drying)?: Total Help from another person to put on and taking off regular upper body clothing?: A Little Help from another person to put on and taking off regular lower body clothing?: Total 6 Click Score: 13   End of Session Equipment Utilized During Treatment: Gait belt;Rolling walker  Activity Tolerance: Other (comment) (limited by high HR with standing) Patient left: in bed;with call bell/phone within reach;with bed alarm set (RN in room)  OT Visit Diagnosis: Unsteadiness on feet (R26.81);Other abnormalities of gait and mobility (R26.89);Pain Pain - Right/Left: Left Pain - part of body: Leg                Time: 0383-3383 OT Time Calculation (min): 33 min Charges:  OT General Charges $OT Visit: 1 Visit OT Evaluation $OT  Eval Moderate Complexity: [place], OTR/L Acute [employer] Pager [phone] Office [phone]     Almon Register 09/28/2020, 10:33 AM

## 2020-09-28 NOTE — Telephone Encounter (Signed)
FMLA forms received. Sent to [employer]

## 2020-09-28 NOTE — Progress Notes (Signed)
Patient has responded well to the Cardizem. Patient is HR is Afib 81bpm. Patient voiced he is felling a lot better. Will continue to monitor.

## 2020-09-28 NOTE — TOC Initial Note (Signed)
Transition of Care [facility]) - Initial/Assessment Note    Patient Details  Name: Andrew Dominguez MRN: [id number] Date of Birth: Dec 12, 1955  Transition of [facility] CM/SW Contact:    Andrew Dominguez, Belt Phone Number: 09/28/2020, 3:23 PM  Clinical Narrative:                  CSW met with patient at bedside. CSW introduced self and explained role.  Patient states lives in the home with his wife. Patient states during the day, his wife keeps their grandchildren(outside of the home). CSW discussed PT recommendation of short term rehab at [facility]. Patient states he is agreeable to SNF placement at discharge. Patient states his SNF preference is in/or somewhere close to [facility]. CSW explained the SNF process. Patient states he has received covid vaccines.   CSW will continue to follow and assist with discharge planning.  Andrew Dominguez, MSW, [place] Clinical Social Worker   Expected Discharge Plan: Skilled Nursing Facility Barriers to Discharge: Continued Medical Work up, Ship broker, SNF Pending bed offer   Patient Goals and CMS Choice        Expected Discharge Plan and Services Expected Discharge Plan: [place] In-house Referral: Clinical Social Work     Living arrangements for the past 2 months: Single Family Home                                      Prior Living Arrangements/Services Living arrangements for the past 2 months: Single Family Home Lives with:: Self, Spouse Patient language and need for interpreter reviewed:: No        Need for Family Participation in Patient Care: Yes (Comment) Care giver support system in place?: Yes (comment)   Criminal Activity/Legal Involvement Pertinent to Current Situation/Hospitalization: No - Comment as needed  Activities of Daily Living Home Assistive Devices/Equipment: Environmental consultant (specify type) ADL Screening (condition at time of admission) Patient's cognitive ability adequate to safely  complete daily activities?: Yes Is the patient deaf or have difficulty hearing?: No Does the patient have difficulty seeing, even when wearing glasses/contacts?: No Does the patient have difficulty concentrating, remembering, or making decisions?: No Patient able to express need for assistance with ADLs?: Yes Does the patient have difficulty dressing or bathing?: No Independently performs ADLs?: Yes (appropriate for developmental age) Does the patient have difficulty walking or climbing stairs?: Yes Weakness of Legs: Left Weakness of Arms/Hands: None  Permission Sought/Granted Permission sought to share information with : Family Supports Permission granted to share information with : Yes, Verbal Permission Granted  Share Information with NAME: Andrew Dominguez  Permission granted to share info w AGENCY: SNFs  Permission granted to share info w Relationship: spouse  Permission granted to share info w Contact Information: [phone] cell# [phone]  Emotional Assessment Appearance:: Appears stated age Attitude/Demeanor/Rapport: Engaged Affect (typically observed): Accepting, Pleasant Orientation: : Oriented to Self, Oriented to Place, Oriented to  Time, Oriented to Situation Alcohol / Substance Use: Not Applicable Psych Involvement: No (comment)  Admission diagnosis:  S/p left hip fracture [Z87.81] Patient Active Problem List   Diagnosis Date Noted  . Atrial fibrillation with RVR ([place]) 09/27/2020  . Fever 09/27/2020  . Hip fracture ([place]) 09/24/2020  . S/p left hip fracture 09/24/2020  . Liver metastases ([place]) 09/12/2020  . Lung nodules 09/12/2020  . Lytic bone lesions on xray 08/23/2020  . Type 2 diabetes mellitus ([place]) 01/01/2017  .  Obesity 01/01/2017  . Hypertriglyceridemia 01/01/2017  . Hypertension 01/01/2017  . Hyperlipidemia, unspecified 01/01/2017  . Hyperlipidemia 01/01/2017  . Diabetes mellitus ([place]) 01/01/2017  . Vitamin D deficiency 03/01/2015  . Bilateral leg  edema 08/26/2014   PCP:  Baxter Hire, MD Pharmacy:   [id number] [place] [id number]: [phone]: [phone]    Social Determinants of Health (SDOH) Interventions    Readmission Risk Interventions No flowsheet data found.

## 2020-09-28 NOTE — Progress Notes (Signed)
Patient back in [place]. HR was elevated at 170-150 for 15 min without change. Patient IV Cardizem was increased to 7.105ml/hr. After 5 min patient responded well, HR was decreased to 110-118. MEWS was escalated, doctor and charge nurse was notified.

## 2020-09-28 NOTE — Progress Notes (Signed)
Physical Therapy Treatment Patient Details Name: Andrew Dominguez MRN: [id number] DOB: 1956-05-16 Today's Date: 09/28/2020    History of Present Illness Patient is a 64 y/o male with PMH of chronic Afib on Eliquis, essential HTN, type II DM, diabetic neuropathy, known lytic lesions, unintentional weight loss, COPD, pulmonary nodules, liver massess, splenic mass, left kidney mass, with concern for metastatic lung cancer. Patient presented with L hip pain with onset 1 month ago and gradually worsening. Patient seen in ED with L hip fracture with metastatic process. Patient s/p IM nail of L femur on 11/21.Transfered to progressive unit 11/23 due to Afib with RVR    PT Comments    Pt on diltiazem upon arrival to room for new onset afib, PT and OT saw pt together to progress mobility and to ensure pt safety with NWB LLE. Pt required mod-max assist +2 for bed mobility and transfer to stand today, pt limited by extreme tachycardia to 199 bpm when standing (HR 120s-150s at EOB). Pt returned to supine and RN alerted. PT encouraged pt to perform ankle pumps (20x/hour) and LLE quad sets to tolerance for circulation, ROM and strength maintenance. PT continuing to recommend SNF level of care post-acutely, will continue to follow.   SpO2 96% and greater on RA    Follow Up Recommendations  SNF;Supervision/Assistance - 24 hour     Equipment Recommendations  Rolling walker with 5" wheels;3in1 (PT);Wheelchair cushion (measurements PT);Wheelchair (measurements PT)    Recommendations for Other Services OT consult     Precautions / Restrictions Precautions Precautions: Fall Precaution Comments: watch HR--shot up to 199 from low 130s when he stood up; started in high 110's/low 120's Restrictions Weight Bearing Restrictions: Yes LLE Weight Bearing: Non weight bearing    Mobility  Bed Mobility Overal bed mobility: Needs Assistance Bed Mobility: Supine to Sit;Sit to Supine     Supine to sit: Max  assist;+2 for physical assistance Sit to supine: Max assist;+2 for physical assistance   General bed mobility comments: max +2 for trunk elevation/lowering, LE progression to EOB and back into bed, and scooting to EOB. Pt assisting with boost up in bed via RLE bridging.  Transfers Overall transfer level: Needs assistance Equipment used: Rolling walker (2 wheeled) Transfers: Sit to/from Stand Sit to Stand: Mod assist;+2 physical assistance         General transfer comment: Mod +2 for power up, hip extension, and steadying upon standing. PT reinforcing NWB LLE by physically placing LLE anteriorly to BOS. Standing tolerance limited by extreme tachycardia to 199 bpm (EOB sitting HR 120s-150s)  Ambulation/Gait             General Gait Details: unable   Stairs             Wheelchair Mobility    Modified Rankin (Stroke Patients Only)       Balance Overall balance assessment: Needs assistance Sitting-balance support: No upper extremity supported;Feet supported Sitting balance-Leahy Scale: Fair     Standing balance support: Bilateral upper extremity supported Standing balance-Leahy Scale: Poor Standing balance comment: RW and additonal external support from therapists                            Cognition Arousal/Alertness: Awake/alert Behavior During Therapy: WFL for tasks assessed/performed Overall Cognitive Status: Within Functional Limits for tasks assessed  Exercises General Exercises - Lower Extremity Ankle Circles/Pumps: AROM;Both;5 reps;Supine Quad Sets: AROM;Left;5 reps;Supine    General Comments General comments (skin integrity, edema, etc.): HRmax 199 bpm during standing, RN notified      Pertinent Vitals/Pain Pain Assessment: Faces Faces Pain Scale: Hurts whole lot Pain Location: L hip Pain Descriptors / Indicators: Grimacing;Guarding;Discomfort Pain Intervention(s): Limited  activity within patient's tolerance;Monitored during session;Patient requesting pain meds-RN notified    Home Living Family/patient expects to be discharged to:: Skilled nursing facility               Additional Comments: Wife has to take care of grandchildren    Prior Function            PT Goals (current goals can now be found in the care plan section) Acute Rehab PT Goals Patient Stated Goal: to rehab and then home PT Goal Formulation: With patient Time For Goal Achievement: 10/10/20 Potential to Achieve Goals: Fair Progress towards PT goals: Progressing toward goals    Frequency    Min 3X/week      PT Plan Current plan remains appropriate    Co-evaluation PT/OT/SLP Co-Evaluation/Treatment: Yes Reason for Co-Treatment: For patient/therapist safety;To address functional/ADL transfers PT goals addressed during session: Mobility/safety with mobility;Balance;Strengthening/ROM OT goals addressed during session: Strengthening/ROM      AM-PAC PT "6 Clicks" Mobility   Outcome Measure  Help needed turning from your back to your side while in a flat bed without using bedrails?: A Lot Help needed moving from lying on your back to sitting on the side of a flat bed without using bedrails?: Total Help needed moving to and from a bed to a chair (including a wheelchair)?: Total Help needed standing up from a chair using your arms (e.g., wheelchair or bedside chair)?: Total Help needed to walk in hospital room?: Total Help needed climbing 3-5 steps with a railing? : Total 6 Click Score: 7    End of Session Equipment Utilized During Treatment: Gait belt Activity Tolerance: Patient limited by pain;Treatment limited secondary to medical complications (Comment) (extreme tachycardia) Patient left: in bed;with call bell/phone within reach Nurse Communication: Mobility status PT Visit Diagnosis: Unsteadiness on feet (R26.81);Muscle weakness (generalized) (M62.81);Difficulty in  walking, not elsewhere classified (R26.2);Pain Pain - Right/Left: Left Pain - part of body: Hip     Time: 0923-0950 PT Time Calculation (min) (ACUTE ONLY): 27 min  Charges:  $Therapeutic Activity: 8-22 mins                     Jarel Cuadra E, PT Acute Rehabilitation Services Pager [phone]  Office [phone]     Sebastien Jackson D Elonda Husky 09/28/2020, 1:09 PM

## 2020-09-28 NOTE — Progress Notes (Signed)
[facility] PROGRESS NOTE    Progress Note  Andrew Dominguez  QJJ:[id number] DOB: March 01, 1956 DOA: 09/24/2020 PCP: Baxter Hire, MD     Brief Narrative:   Andrew Dominguez is an 64 y.o. male past medical history significant for chronic atrial fibrillation on Eliquis, essential hypertension, diabetes mellitus type 2, diabetic neuropathy known lytic lesion, with unintentional weight loss COPD with pulmonary nodules and liver masses, splenic and kidneys masses concerning for metastatic lung cancer presents to the hospital with severe left hip pain for about a month  Assessment/Plan:   Pathologic pathologic left hip fracture: Status post intramedullary nailing on 09/25/2020. Continue analgesic appreciate orthopedics assistance. Physical therapy and occupational therapy evaluated the patient the recommended skilled nursing facility.  A. fib with RVR: Started on IV Cardizem, will go ahead and transition him to oral diltiazem.  Continue to monitor rate on telemetry.  Continue Eliquis.  Fever: Had a temp yesterday of 101, procalcitonin is low yield has no leukocytosis we will continue to monitor, chest x-ray was ordered that showed findings.  Metastatic lung cancer: Follow-up with oncology as an outpatient.  Next line plan for biopsy on 10/04/2020 Eliquis should be held 48 hours prior to biopsy.  Hypokalemia: Repleted now improved.  Diabetes mellitus type 2: With an A1c of 5.4, continue current regimen.  COPD: Stable.    DVT prophylaxis: lovenox Family Communication:none Status is: Inpatient  Remains inpatient appropriate because:Hemodynamically unstable   Dispo:  Patient From: Home  Planned Disposition: Home with Health Care Svc  Expected discharge date: 09/30/20  Medically stable for discharge: No         Code Status:     Code Status Orders  (From admission, onward)         Start     Ordered   09/24/20 2038  Full code  Continuous        09/24/20  2041        Code Status History    This patient has a current code status but no historical code status.   Advance Care Planning Activity        IV Access:    Peripheral IV   Procedures and diagnostic studies:   DG CHEST PORT 1 VIEW  Result Date: 09/27/2020 CLINICAL DATA:  Fever EXAM: PORTABLE CHEST 1 VIEW COMPARISON:  09/24/2020.  CT 09/01/2020. FINDINGS: Heart size is normal. Mediastinal shadows are normal. 2-3 cm indistinct density seen lateral to the right hilum could be an infiltrate or mass. Most of the abnormalities shown on the recent chest CT cannot be seen by radiography. IMPRESSION: 2-3 cm indistinct density lateral to the right hilum could be an infiltrate or mass. Most of the abnormalities shown on the recent chest CT cannot be seen by radiography. Electronically Signed   By: Nelson Chimes M.D.   On: 09/27/2020 14:09     Medical Consultants:    None.  Anti-Infectives:   none  Subjective:    [name] no cough no shortness of breath he relates he is close to baseline. Objective:    Vitals:   09/28/20 0400 09/28/20 0401 09/28/20 0416 09/28/20 0745  BP: 123/86   (!) 133/94  Pulse: (!) 105 96  100  Resp: 16   19  Temp:   98.6 F (37 C) 98.4 F (36.9 C)  TempSrc:    Oral  SpO2: 94%   97%  Weight:      Height:       SpO2: 97 %  O2 Flow Rate (L/min): 2 L/min   Intake/Output Summary (Last 24 hours) at 09/28/2020 0806 Last data filed at 09/28/2020 0100 Gross per 24 hour  Intake 708.08 ml  Output 3500 ml  Net -2791.92 ml   Filed Weights   09/25/20 0500 09/25/20 1409  Weight: 128.8 kg 128.8 kg    Exam: General exam: In no acute distress. Respiratory system: Good air movement and clear to auscultation. Cardiovascular system: S1 & S2 heard, RRR. No JVD. Gastrointestinal system: Abdomen is nondistended, soft and nontender.  Extremities: No pedal edema. Skin: No rashes, lesions or ulcers Psychiatry: Judgement and insight appear normal.  Mood & affect appropriate.    Data Reviewed:    Labs: Basic Metabolic Panel: Recent Labs  Lab 09/24/20 1154 09/24/20 1154 09/25/20 0316 09/25/20 0316 09/26/20 0256 09/26/20 0256 09/27/20 0152 09/28/20 0456  NA 136  --  139  --  138  --  137 138  K 3.6   < > 4.0   < > 3.4*   < > 4.2 4.0  CL 101  --  102  --  100  --  100 101  CO2 21*  --  25  --  24  --  25 28  GLUCOSE 169*  --  100*  --  136*  --  118* 121*  BUN 29*  --  22  --  21  --  18 15  CREATININE 1.24  --  1.19  --  1.18  --  0.99 0.92  CALCIUM 9.6  --  9.6  --  9.0  --  8.8* 9.0  MG  --   --  1.7  --  1.8  --   --   --    < > = values in this interval not displayed.   GFR Estimated Creatinine Clearance: 112.6 mL/min (by C-G formula based on SCr of 0.92 mg/dL). Liver Function Tests: Recent Labs  Lab 09/24/20 1154  AST 15  ALT 12  ALKPHOS 56  BILITOT 0.9  PROT 7.5  ALBUMIN 3.9   No results for input(s): LIPASE, AMYLASE in the last 168 hours. No results for input(s): AMMONIA in the last 168 hours. Coagulation profile No results for input(s): INR, PROTIME in the last 168 hours. COVID-19 Labs  No results for input(s): DDIMER, FERRITIN, LDH, CRP in the last 72 hours.  Lab Results  Component Value Date   [place] NEGATIVE 09/24/2020   SARSCOV2NAA NOT DETECTED 04/03/2019    CBC: Recent Labs  Lab 09/24/20 1154 09/25/20 0316 09/26/20 0256 09/27/20 0152 09/28/20 0456  WBC 7.7 6.4 6.5 7.1 5.8  NEUTROABS 5.4  --   --   --   --   HGB 12.3* 11.4* 10.3* 10.2* 9.8*  HCT 39.1 36.8* 33.5* 33.3* 32.6*  MCV 81.5 83.6 82.5 84.3 84.0  PLT 248 214 182 178 154   Cardiac Enzymes: No results for input(s): CKTOTAL, CKMB, CKMBINDEX, TROPONINI in the last 168 hours. BNP (last 3 results) No results for input(s): PROBNP in the last 8760 hours. CBG: Recent Labs  Lab 09/27/20 1626 09/27/20 2133 09/28/20 0002 09/28/20 0418 09/28/20 0748  GLUCAP 157* 161* 128* 110* 128*   D-Dimer: No results for input(s):  DDIMER in the last 72 hours. Hgb A1c: No results for input(s): HGBA1C in the last 72 hours. Lipid Profile: No results for input(s): CHOL, HDL, LDLCALC, TRIG, CHOLHDL, LDLDIRECT in the last 72 hours. Thyroid function studies: No results for input(s): TSH, T4TOTAL, T3FREE, THYROIDAB in the last  72 hours.  Invalid input(s): FREET3 Anemia work up: No results for input(s): VITAMINB12, FOLATE, FERRITIN, TIBC, IRON, RETICCTPCT in the last 72 hours. Sepsis Labs: Recent Labs  Lab 09/24/20 1154 09/24/20 1350 09/25/20 0316 09/26/20 0256 09/27/20 0152 09/27/20 1200 09/28/20 0456  PROCALCITON  --   --   --   --   --  0.13 <0.10  WBC   < >  --  6.4 6.5 7.1  --  5.8  LATICACIDVEN  --  1.8  --   --   --  1.9  --    < > = values in this interval not displayed.   Microbiology Recent Results (from the past 240 hour(s))  Resp Panel by RT-PCR (Flu A&B, Covid) Nasopharyngeal Swab     Status: None   Collection Time: 09/24/20  3:38 PM   Specimen: Nasopharyngeal Swab; Nasopharyngeal(NP) swabs in vial transport medium  Result Value Ref Range Status   SARS Coronavirus 2 by RT PCR NEGATIVE NEGATIVE Final    Comment: (NOTE) SARS-CoV-2 target nucleic acids are NOT DETECTED.  The SARS-CoV-2 RNA is generally detectable in upper respiratory specimens during the acute phase of infection. The lowest concentration of SARS-CoV-2 viral copies this assay can detect is 138 copies/mL. A negative result does not preclude SARS-Cov-2 infection and should not be used as the sole basis for treatment or other patient management decisions. A negative result may occur with  improper specimen collection/handling, submission of specimen other than nasopharyngeal swab, presence of viral mutation(s) within the areas targeted by this assay, and inadequate number of viral copies(<138 copies/mL). A negative result must be combined with clinical observations, patient history, and epidemiological information. The expected  result is Negative.  Fact Sheet for Patients:  EntrepreneurPulse.com.au  Fact Sheet for Healthcare Providers:  IncredibleEmployment.be  This test is no t yet approved or cleared by the [place] FDA and  has been authorized for detection and/or diagnosis of SARS-CoV-2 by FDA under an Emergency Use Authorization (EUA). This EUA will remain  in effect (meaning this test can be used) for the duration of the COVID-19 declaration under Section 564(b)(1) of the Act, 21 U.S.C.section 360bbb-3(b)(1), unless the authorization is terminated  or revoked sooner.       Influenza A by PCR NEGATIVE NEGATIVE Final   Influenza B by PCR NEGATIVE NEGATIVE Final    Comment: (NOTE) The Xpert Xpress SARS-CoV-2/FLU/RSV plus assay is intended as an aid in the diagnosis of influenza from Nasopharyngeal swab specimens and should not be used as a sole basis for treatment. Nasal washings and aspirates are unacceptable for Xpert Xpress SARS-CoV-2/FLU/RSV testing.  Fact Sheet for Patients: EntrepreneurPulse.com.au  Fact Sheet for Healthcare Providers: IncredibleEmployment.be  This test is not yet approved or cleared by the [place] FDA and has been authorized for detection and/or diagnosis of SARS-CoV-2 by FDA under an Emergency Use Authorization (EUA). This EUA will remain in effect (meaning this test can be used) for the duration of the COVID-19 declaration under Section 564(b)(1) of the Act, 21 U.S.C. section 360bbb-3(b)(1), unless the authorization is terminated or revoked.  Performed at [facility], [address] [place], [place] [address]   MRSA PCR Screening     Status: None   Collection Time: 09/24/20 11:24 PM   Specimen: Nasopharyngeal  Result Value Ref Range Status   MRSA by PCR NEGATIVE NEGATIVE Final    Comment:        The GeneXpert MRSA Assay (FDA approved for NASAL specimens only),  is one  component of a comprehensive MRSA colonization surveillance program. It is not intended to diagnose MRSA infection nor to guide or monitor treatment for MRSA infections. Performed at [facility], [place] [address] [place], [place] [address]   Culture, blood (routine x 2)     Status: None (Preliminary result)   Collection Time: 09/27/20 11:59 AM   Specimen: BLOOD LEFT HAND  Result Value Ref Range Status   Specimen Description BLOOD LEFT HAND  Final   Special Requests   Final    BOTTLES DRAWN AEROBIC AND ANAEROBIC Blood Culture adequate volume   Culture   Final    NO GROWTH < 24 HOURS Performed at [facility], [place] [address], [place], [place] [address]    Report Status PENDING  Incomplete  Culture, blood (routine x 2)     Status: None (Preliminary result)   Collection Time: 09/27/20 12:00 PM   Specimen: BLOOD RIGHT HAND  Result Value Ref Range Status   Specimen Description BLOOD RIGHT HAND  Final   Special Requests   Final    BOTTLES DRAWN AEROBIC AND ANAEROBIC Blood Culture adequate volume   Culture   Final    NO GROWTH < 24 HOURS Performed at [facility], [place] [address], [place], [place] [address]    Report Status PENDING  Incomplete     Medications:    apixaban  5 mg Oral BID   atenolol  12.5 mg Oral Daily   docusate sodium  100 mg Oral BID   feeding supplement (GLUCERNA SHAKE)  237 mL Oral TID BM   fenofibrate  54 mg Oral Daily   gabapentin  100 mg Oral TID   insulin aspart  0-9 Units Subcutaneous Q4H   multivitamin with minerals  1 tablet Oral Daily   rosuvastatin  5 mg Oral Daily   umeclidinium-vilanterol  1 puff Inhalation Daily   Continuous Infusions:  diltiazem (CARDIZEM) infusion 5 mg/hr (09/28/20 0615)   methocarbamol (ROBAXIN) IV        LOS: 4 days   Charlynne Cousins  [facility]  09/28/2020, 8:06 AM

## 2020-09-28 NOTE — NC FL2 (Signed)
[place] MEDICAID FL2 LEVEL OF CARE SCREENING TOOL     IDENTIFICATION  Patient Name: Andrew Dominguez Birthdate: 1956-03-17 Sex: male Admission Date (Current Location): 09/24/2020  [facility] and [place] Number:  Engineering geologist and Address:  [facility], [place] [address] [place], [place] [address]      Provider Number: [id number]  Attending Physician Name and Address:  Charlynne Cousins, MD  Relative Name and Phone Number:       Current Level of Care: Hospital Recommended Level of Care: [place] Prior Approval Number:    Date Approved/Denied:   PASRR Number: [id number]  Discharge Plan: SNF    Current Diagnoses: Patient Active Problem List   Diagnosis Date Noted  . Atrial fibrillation with RVR ([place]) 09/27/2020  . Fever 09/27/2020  . Hip fracture ([place]) 09/24/2020  . S/p left hip fracture 09/24/2020  . Liver metastases ([place]) 09/12/2020  . Lung nodules 09/12/2020  . Lytic bone lesions on xray 08/23/2020  . Type 2 diabetes mellitus ([place]) 01/01/2017  . Obesity 01/01/2017  . Hypertriglyceridemia 01/01/2017  . Hypertension 01/01/2017  . Hyperlipidemia, unspecified 01/01/2017  . Hyperlipidemia 01/01/2017  . Diabetes mellitus ([place]) 01/01/2017  . Vitamin D deficiency 03/01/2015  . Bilateral leg edema 08/26/2014    Orientation RESPIRATION BLADDER Height & Weight     Self, Time, Situation, Place  Normal External catheter, Incontinent Weight: 283 lb 15.2 oz (128.8 kg) Height:  6' 0.01" (182.9 cm)  BEHAVIORAL SYMPTOMS/MOOD NEUROLOGICAL BOWEL NUTRITION STATUS      Continent Diet (please see discharge summary)  AMBULATORY STATUS COMMUNICATION OF NEEDS Skin   Extensive Assist Verbally Surgical wounds (closed incision hip/left)                       Personal Care Assistance Level of Assistance  Bathing, Feeding Bathing Assistance: Limited assistance Feeding assistance: Independent       Functional Limitations Info   Sight, Hearing, Speech Sight Info: Adequate Hearing Info: Adequate Speech Info: Adequate    SPECIAL CARE FACTORS FREQUENCY  PT (By licensed PT), OT (By licensed OT)     PT Frequency: 5x per week OT Frequency: 5x per week            Contractures Contractures Info: Not present    Additional Factors Info  Code Status, Allergies, Insulin Sliding Scale Code Status Info: FULL Allergies Info: Sulfa Antibiotics   Insulin Sliding Scale Info: see discharge summary       Current Medications (09/28/2020):  This is the current hospital active medication list Current Facility-Administered Medications  Medication Dose Route Frequency Provider Last Rate Last Admin  . acetaminophen (TYLENOL) tablet 650 mg  650 mg Oral Q6H PRN Jennye Boroughs, MD   650 mg at 09/28/20 1312  . apixaban (ELIQUIS) tablet 5 mg  5 mg Oral BID Steenwyk, Yujing Z, RPH   5 mg at 09/28/20 1000  . atenolol (TENORMIN) tablet 12.5 mg  12.5 mg Oral Daily Irene Pap N, DO   12.5 mg at 09/28/20 0959  . bisacodyl (DULCOLAX) EC tablet 5 mg  5 mg Oral Daily PRN Irene Pap N, DO      . diltiazem (CARDIZEM) 125 mg in dextrose 5% 125 mL (1 mg/mL) infusion  5-15 mg/hr Intravenous Continuous Jennye Boroughs, MD 7.5 mL/hr at 09/28/20 1001 7.5 mg/hr at 09/28/20 1001  . diltiazem (CARDIZEM) tablet 60 mg  60 mg Oral Q6H Charlynne Cousins, MD   [phone]  mg at 09/28/20 1159  . docusate sodium (COLACE) capsule 100 mg  100 mg Oral BID Magnant, Charles L, PA-C   100 mg at 09/28/20 1000  . feeding supplement (GLUCERNA SHAKE) (GLUCERNA SHAKE) liquid 237 mL  237 mL Oral TID BM Jennye Boroughs, MD   237 mL at 09/28/20 1301  . fenofibrate tablet 54 mg  54 mg Oral Daily Irene Pap N, DO   54 mg at 09/28/20 1030  . gabapentin (NEURONTIN) capsule 100 mg  100 mg Oral TID Irene Pap N, DO   100 mg at 09/28/20 1547  . HYDROmorphone (DILAUDID) injection 0.5 mg  0.5 mg Intravenous Q4H PRN Magnant, Charles L, PA-C      . insulin aspart (novoLOG)  injection 0-9 Units  0-9 Units Subcutaneous Q4H Irene Pap N, DO   2 Units at 09/28/20 1200  . menthol-cetylpyridinium (CEPACOL) lozenge 3 mg  1 lozenge Oral PRN Magnant, Charles L, PA-C       Or  . phenol (CHLORASEPTIC) mouth spray 1 spray  1 spray Mouth/Throat PRN Magnant, Charles L, PA-C      . methocarbamol (ROBAXIN) tablet 500 mg  500 mg Oral Q6H PRN Magnant, Charles L, PA-C   500 mg at 09/26/20 2133   Or  . methocarbamol (ROBAXIN) 500 mg in dextrose 5 % 50 mL IVPB  500 mg Intravenous Q6H PRN Magnant, Charles L, PA-C      . metoCLOPramide (REGLAN) tablet 5-10 mg  5-10 mg Oral Q8H PRN Magnant, Charles L, PA-C       Or  . metoCLOPramide (REGLAN) injection 5-10 mg  5-10 mg Intravenous Q8H PRN Magnant, Charles L, PA-C      . multivitamin with minerals tablet 1 tablet  1 tablet Oral Daily Jennye Boroughs, MD   1 tablet at 09/28/20 1000  . ondansetron (ZOFRAN) tablet 4 mg  4 mg Oral Q6H PRN Magnant, Charles L, PA-C       Or  . ondansetron (ZOFRAN) injection 4 mg  4 mg Intravenous Q6H PRN Magnant, Charles L, PA-C      . oxyCODONE (Oxy IR/ROXICODONE) immediate release tablet 5-10 mg  5-10 mg Oral Q4H PRN Magnant, Charles L, PA-C   10 mg at 09/28/20 0958  . rosuvastatin (CRESTOR) tablet 5 mg  5 mg Oral Daily Darden, Carole N, DO   5 mg at 09/28/20 1000  . senna-docusate (Senokot-S) tablet 1 tablet  1 tablet Oral QHS PRN Irene Pap N, DO      . umeclidinium-vilanterol (ANORO ELLIPTA) 62.5-25 MCG/INH 1 puff  1 puff Inhalation Daily Irene Pap N, DO   1 puff at 09/26/20 1024     Discharge Medications: Please see discharge summary for a list of discharge medications.  Relevant Imaging Results:  Relevant Lab Results:   Additional Information SSN [id number]  Vinie Sill, LCSWA

## 2020-09-29 LAB — GLUCOSE, CAPILLARY
Glucose-Capillary: 112 mg/dL — ABNORMAL HIGH (ref 70–99)
Glucose-Capillary: 116 mg/dL — ABNORMAL HIGH (ref 70–99)
Glucose-Capillary: 141 mg/dL — ABNORMAL HIGH (ref 70–99)
Glucose-Capillary: 153 mg/dL — ABNORMAL HIGH (ref 70–99)
Glucose-Capillary: 178 mg/dL — ABNORMAL HIGH (ref 70–99)
Glucose-Capillary: 194 mg/dL — ABNORMAL HIGH (ref 70–99)

## 2020-09-29 LAB — PROCALCITONIN: Procalcitonin: <0.1 ng/mL

## 2020-09-29 MED ORDER — FUROSEMIDE 20 MG PO TABS
20.0000 mg | ORAL_TABLET | Freq: Every day | ORAL | Status: DC
  Administered 2020-09-29 – 2020-10-05 (×7): 20 mg via ORAL
  Filled 2020-09-29 (×7): qty 1

## 2020-09-29 MED ORDER — GABAPENTIN 100 MG PO CAPS
200.0000 mg | ORAL_CAPSULE | Freq: Three times a day (TID) | ORAL | Status: DC
  Administered 2020-09-29 – 2020-10-05 (×19): 200 mg via ORAL
  Filled 2020-09-29 (×20): qty 2

## 2020-09-29 MED ORDER — METFORMIN HCL 500 MG PO TABS
1000.0000 mg | ORAL_TABLET | Freq: Two times a day (BID) | ORAL | Status: DC
  Administered 2020-09-29 – 2020-10-05 (×13): 1000 mg via ORAL
  Filled 2020-09-29 (×13): qty 2

## 2020-09-29 MED ORDER — LOSARTAN POTASSIUM 50 MG PO TABS: 100.0000 mg | ORAL_TABLET | Freq: Every day | ORAL | Status: DC

## 2020-09-29 NOTE — Progress Notes (Signed)
[facility] PROGRESS NOTE    Progress Note  Andrew Dominguez  ZOX:[id number] DOB: 1956/04/21 DOA: 09/24/2020 PCP: Baxter Hire, MD     Brief Narrative:   Andrew Dominguez is an 64 y.o. male past medical history significant for chronic atrial fibrillation on Eliquis, essential hypertension, diabetes mellitus type 2, diabetic neuropathy known lytic lesion, with unintentional weight loss COPD with pulmonary nodules and liver masses, splenic and kidneys masses concerning for metastatic lung cancer presents to the hospital with severe left hip pain for about a month  Assessment/Plan:   Pathologic pathologic left hip fracture: Status post intramedullary nailing on 09/25/2020. Continue analgesic appreciate orthopedics assistance. Physical therapy evaluated the patient recommended skilled nursing facility he is awaiting placement.  A. fib with RVR: Now on oral Cardizem and atenolol one of his doses was held yesterday due to the borderline blood pressure. We will continue Eliquis and continue current dose of Cardizem. Will need to keep better control of his heart rate it needs to be less than 90.  Essential hypertension: Continue to hold losartan, his blood pressure seems well controlled.  Fever: Had a temp yesterday of 101, procalcitonin low yield, chest x-ray which I personally reviewed shows no acute findings. He has remained afebrile we will continue to monitor.  Metastatic lung cancer: Follow-up with oncology as an outpatient.  Next line plan for biopsy on 10/04/2020 Eliquis should be held 48 hours prior to biopsy.  Hypokalemia: Repleted now improved.  Diabetes mellitus type 2: With an A1c of 5.4, resume his Metformin and Jardiance.  Diabetes peripheral neuropathy: Increase his Metformin.  Normocytic anemia: Hemoglobin remained stable continue to monitor intermittently.  COPD: Stable.    DVT prophylaxis: lovenox Family Communication:none Status is:  Inpatient  Remains inpatient appropriate because:Hemodynamically unstable   Dispo:  Patient From: Home  Planned Disposition: Home with Health Care Svc  Expected discharge date: 10/03/20  Medically stable for discharge: Yes awaiting skilled nursing facility placement.  Code Status:     Code Status Orders  (From admission, onward)         Start     Ordered   09/24/20 2038  Full code  Continuous        09/24/20 2041        Code Status History    This patient has a current code status but no historical code status.   Advance Care Planning Activity        IV Access:    Peripheral IV   Procedures and diagnostic studies:   DG CHEST PORT 1 VIEW  Result Date: 09/27/2020 CLINICAL DATA:  Fever EXAM: PORTABLE CHEST 1 VIEW COMPARISON:  09/24/2020.  CT 09/01/2020. FINDINGS: Heart size is normal. Mediastinal shadows are normal. 2-3 cm indistinct density seen lateral to the right hilum could be an infiltrate or mass. Most of the abnormalities shown on the recent chest CT cannot be seen by radiography. IMPRESSION: 2-3 cm indistinct density lateral to the right hilum could be an infiltrate or mass. Most of the abnormalities shown on the recent chest CT cannot be seen by radiography. Electronically Signed   By: Nelson Chimes M.D.   On: 09/27/2020 14:09     Medical Consultants:    None.  Anti-Infectives:   none  Subjective:    [name] he relates he feels great no new cough no fever overnight.. Objective:    Vitals:   09/29/20 0414 09/29/20 0415 09/29/20 0500 09/29/20 0609  BP: 123/74   129/81  Pulse: 92     Resp: 19   19  Temp:  98.7 F (37.1 C)    TempSrc:      SpO2: 99%   97%  Weight:   132 kg   Height:       SpO2: 97 % O2 Flow Rate (L/min): 2 L/min   Intake/Output Summary (Last 24 hours) at 09/29/2020 0656 Last data filed at 09/28/2020 2000 Gross per 24 hour  Intake 600 ml  Output 1600 ml  Net -1000 ml   Filed Weights   09/25/20 0500  09/25/20 1409 09/29/20 0500  Weight: 128.8 kg 128.8 kg 132 kg    Exam: General exam: In no acute distress. Respiratory system: Good air movement and clear to auscultation. Cardiovascular system: S1 & S2 heard, RRR. No JVD. Gastrointestinal system: Abdomen is nondistended, soft and nontender.  Extremities: No pedal edema. Skin: No rashes, lesions or ulcers  Data Reviewed:    Labs: Basic Metabolic Panel: Recent Labs  Lab 09/24/20 1154 09/24/20 1154 09/25/20 0316 09/25/20 0316 09/26/20 0256 09/26/20 0256 09/27/20 0152 09/28/20 0456  NA 136  --  139  --  138  --  137 138  K 3.6   < > 4.0   < > 3.4*   < > 4.2 4.0  CL 101  --  102  --  100  --  100 101  CO2 21*  --  25  --  24  --  25 28  GLUCOSE 169*  --  100*  --  136*  --  118* 121*  BUN 29*  --  22  --  21  --  18 15  CREATININE 1.24  --  1.19  --  1.18  --  0.99 0.92  CALCIUM 9.6  --  9.6  --  9.0  --  8.8* 9.0  MG  --   --  1.7  --  1.8  --   --   --    < > = values in this interval not displayed.   GFR Estimated Creatinine Clearance: 114 mL/min (by C-G formula based on SCr of 0.92 mg/dL). Liver Function Tests: Recent Labs  Lab 09/24/20 1154  AST 15  ALT 12  ALKPHOS 56  BILITOT 0.9  PROT 7.5  ALBUMIN 3.9   No results for input(s): LIPASE, AMYLASE in the last 168 hours. No results for input(s): AMMONIA in the last 168 hours. Coagulation profile No results for input(s): INR, PROTIME in the last 168 hours. COVID-19 Labs  No results for input(s): DDIMER, FERRITIN, LDH, CRP in the last 72 hours.  Lab Results  Component Value Date   [place] NEGATIVE 09/24/2020   SARSCOV2NAA NOT DETECTED 04/03/2019    CBC: Recent Labs  Lab 09/24/20 1154 09/25/20 0316 09/26/20 0256 09/27/20 0152 09/28/20 0456  WBC 7.7 6.4 6.5 7.1 5.8  NEUTROABS 5.4  --   --   --   --   HGB 12.3* 11.4* 10.3* 10.2* 9.8*  HCT 39.1 36.8* 33.5* 33.3* 32.6*  MCV 81.5 83.6 82.5 84.3 84.0  PLT 248 214 182 178 154   Cardiac  Enzymes: No results for input(s): CKTOTAL, CKMB, CKMBINDEX, TROPONINI in the last 168 hours. BNP (last 3 results) No results for input(s): PROBNP in the last 8760 hours. CBG: Recent Labs  Lab 09/28/20 1115 09/28/20 1610 09/28/20 2037 09/29/20 0007 09/29/20 0416  GLUCAP 165* 167* 172* 141* 116*   D-Dimer: No results for input(s): DDIMER in the last 72 hours. Hgb  A1c: No results for input(s): HGBA1C in the last 72 hours. Lipid Profile: No results for input(s): CHOL, HDL, LDLCALC, TRIG, CHOLHDL, LDLDIRECT in the last 72 hours. Thyroid function studies: No results for input(s): TSH, T4TOTAL, T3FREE, THYROIDAB in the last 72 hours.  Invalid input(s): FREET3 Anemia work up: No results for input(s): VITAMINB12, FOLATE, FERRITIN, TIBC, IRON, RETICCTPCT in the last 72 hours. Sepsis Labs: Recent Labs  Lab 09/24/20 1154 09/24/20 1350 09/25/20 0316 09/26/20 0256 09/27/20 0152 09/27/20 1200 09/28/20 0456 09/29/20 0149  PROCALCITON  --   --   --   --   --  0.13 <0.10 <0.10  WBC   < >  --  6.4 6.5 7.1  --  5.8  --   LATICACIDVEN  --  1.8  --   --   --  1.9  --   --    < > = values in this interval not displayed.   Microbiology Recent Results (from the past 240 hour(s))  Resp Panel by RT-PCR (Flu A&B, Covid) Nasopharyngeal Swab     Status: None   Collection Time: 09/24/20  3:38 PM   Specimen: Nasopharyngeal Swab; Nasopharyngeal(NP) swabs in vial transport medium  Result Value Ref Range Status   SARS Coronavirus 2 by RT PCR NEGATIVE NEGATIVE Final    Comment: (NOTE) SARS-CoV-2 target nucleic acids are NOT DETECTED.  The SARS-CoV-2 RNA is generally detectable in upper respiratory specimens during the acute phase of infection. The lowest concentration of SARS-CoV-2 viral copies this assay can detect is 138 copies/mL. A negative result does not preclude SARS-Cov-2 infection and should not be used as the sole basis for treatment or other patient management decisions. A negative  result may occur with  improper specimen collection/handling, submission of specimen other than nasopharyngeal swab, presence of viral mutation(s) within the areas targeted by this assay, and inadequate number of viral copies(<138 copies/mL). A negative result must be combined with clinical observations, patient history, and epidemiological information. The expected result is Negative.  Fact Sheet for Patients:  EntrepreneurPulse.com.au  Fact Sheet for Healthcare Providers:  IncredibleEmployment.be  This test is no t yet approved or cleared by the [place] FDA and  has been authorized for detection and/or diagnosis of SARS-CoV-2 by FDA under an Emergency Use Authorization (EUA). This EUA will remain  in effect (meaning this test can be used) for the duration of the COVID-19 declaration under Section 564(b)(1) of the Act, 21 U.S.C.section 360bbb-3(b)(1), unless the authorization is terminated  or revoked sooner.       Influenza A by PCR NEGATIVE NEGATIVE Final   Influenza B by PCR NEGATIVE NEGATIVE Final    Comment: (NOTE) The Xpert Xpress SARS-CoV-2/FLU/RSV plus assay is intended as an aid in the diagnosis of influenza from Nasopharyngeal swab specimens and should not be used as a sole basis for treatment. Nasal washings and aspirates are unacceptable for Xpert Xpress SARS-CoV-2/FLU/RSV testing.  Fact Sheet for Patients: EntrepreneurPulse.com.au  Fact Sheet for Healthcare Providers: IncredibleEmployment.be  This test is not yet approved or cleared by the [place] FDA and has been authorized for detection and/or diagnosis of SARS-CoV-2 by FDA under an Emergency Use Authorization (EUA). This EUA will remain in effect (meaning this test can be used) for the duration of the COVID-19 declaration under Section 564(b)(1) of the Act, 21 U.S.C. section 360bbb-3(b)(1), unless the authorization is  terminated or revoked.  Performed at [facility], [address], [place], [place] [address]   MRSA PCR Screening  Status: None   Collection Time: 09/24/20 11:24 PM   Specimen: Nasopharyngeal  Result Value Ref Range Status   MRSA by PCR NEGATIVE NEGATIVE Final    Comment:        The GeneXpert MRSA Assay (FDA approved for NASAL specimens only), is one component of a comprehensive MRSA colonization surveillance program. It is not intended to diagnose MRSA infection nor to guide or monitor treatment for MRSA infections. Performed at [facility], [place] [address] [place], [place] [address]   Culture, blood (routine x 2)     Status: None (Preliminary result)   Collection Time: 09/27/20 11:59 AM   Specimen: BLOOD LEFT HAND  Result Value Ref Range Status   Specimen Description BLOOD LEFT HAND  Final   Special Requests   Final    BOTTLES DRAWN AEROBIC AND ANAEROBIC Blood Culture adequate volume   Culture   Final    NO GROWTH < 24 HOURS Performed at [facility], [place] [address], [place], [place] [address]    Report Status PENDING  Incomplete  Culture, blood (routine x 2)     Status: None (Preliminary result)   Collection Time: 09/27/20 12:00 PM   Specimen: BLOOD RIGHT HAND  Result Value Ref Range Status   Specimen Description BLOOD RIGHT HAND  Final   Special Requests   Final    BOTTLES DRAWN AEROBIC AND ANAEROBIC Blood Culture adequate volume   Culture   Final    NO GROWTH < 24 HOURS Performed at [facility], [place] [address], [place], [place] [address]    Report Status PENDING  Incomplete     Medications:    apixaban  5 mg Oral BID   atenolol  12.5 mg Oral Daily   diltiazem  60 mg Oral Q6H   docusate sodium  100 mg Oral BID   feeding supplement (GLUCERNA SHAKE)  237 mL Oral TID BM   fenofibrate  54 mg Oral Daily   gabapentin  100 mg Oral TID   insulin aspart  0-9 Units Subcutaneous Q4H   multivitamin with minerals  1 tablet  Oral Daily   rosuvastatin  5 mg Oral Daily   umeclidinium-vilanterol  1 puff Inhalation Daily   Continuous Infusions:  diltiazem (CARDIZEM) infusion 7.5 mg/hr (09/28/20 1001)   methocarbamol (ROBAXIN) IV        LOS: 5 days   Charlynne Cousins  [facility]  09/29/2020, 6:56 AM

## 2020-09-30 LAB — GLUCOSE, CAPILLARY
Glucose-Capillary: 134 mg/dL — ABNORMAL HIGH (ref 70–99)
Glucose-Capillary: 134 mg/dL — ABNORMAL HIGH (ref 70–99)
Glucose-Capillary: 163 mg/dL — ABNORMAL HIGH (ref 70–99)
Glucose-Capillary: 176 mg/dL — ABNORMAL HIGH (ref 70–99)
Glucose-Capillary: 177 mg/dL — ABNORMAL HIGH (ref 70–99)
Glucose-Capillary: 179 mg/dL — ABNORMAL HIGH (ref 70–99)

## 2020-09-30 MED ORDER — DILTIAZEM HCL 60 MG PO TABS
90.0000 mg | ORAL_TABLET | Freq: Four times a day (QID) | ORAL | Status: DC
  Administered 2020-09-30 – 2020-10-03 (×12): 90 mg via ORAL
  Filled 2020-09-30 (×12): qty 2

## 2020-09-30 NOTE — Progress Notes (Signed)
Patient on Eliquis for DVT prophylaxis Nonweightbearing left lower extremity but okay to weight-bear for transfers on right lower extremity Follow-up in 7 days for suture removal

## 2020-09-30 NOTE — Progress Notes (Signed)
Physical Therapy Treatment Patient Details Name: Andrew Dominguez MRN: [id number] DOB: Mar 22, 1956 Today's Date: 09/30/2020    History of Present Illness Patient is a 64 y/o male with PMH of chronic Afib on Eliquis, essential HTN, type II DM, diabetic neuropathy, known lytic lesions, unintentional weight loss, COPD, pulmonary nodules, liver massess, splenic mass, left kidney mass, with concern for metastatic lung cancer. Patient presented with L hip pain with onset 1 month ago and gradually worsening. Patient seen in ED with L hip fracture with metastatic process. Patient s/p IM nail of L femur on 11/21.Transfered to progressive unit 11/23 due to Afib with RVR    PT Comments    Pt progressing steadily within limits of NWB status on the L LE and transfer activity only.  Emphasis on LE exercise with stress on L LE, transitions to EOB , sit to stand and squat-pivot transfers.    Follow Up Recommendations  SNF;Supervision/Assistance - 24 hour     Equipment Recommendations  Rolling walker with 5" wheels;3in1 (PT);Wheelchair cushion (measurements PT);Wheelchair (measurements PT)    Recommendations for Other Services       Precautions / Restrictions Precautions Precautions: Fall Precaution Comments: watch HR, cardizem management Restrictions LLE Weight Bearing: Non weight bearing    Mobility  Bed Mobility Overal bed mobility: Needs Assistance Bed Mobility: Supine to Sit     Supine to sit: Mod assist (+2 would be helpful)     General bed mobility comments: minimal assist at LE's, cues to bridge.  Mod truncal assist until pt able to assist with UE's  Transfers Overall transfer level: Needs assistance   Transfers: Sit to/from [employer] Transfers Sit to Stand: Andrew assist   Squat pivot transfers: Andrew assist     General transfer comment: +2 not available at the time.  Light Andrew assist to stand up on R LE and squat pivote more with stability assist.  Ambulation/Gait              General Gait Details: unable   Stairs             Wheelchair Mobility    Modified Rankin (Stroke Patients Only)       Balance   Sitting-balance support: No upper extremity supported;Feet supported Sitting balance-Leahy Scale: Fair       Standing balance-Leahy Scale: Poor                              Cognition Arousal/Alertness: Awake/alert Behavior During Therapy: WFL for tasks assessed/performed Overall Cognitive Status: Within Functional Limits for tasks assessed                                        Exercises General Exercises - Lower Extremity Ankle Circles/Pumps: Both;15 reps;Supine Quad Sets: 5 reps;Both;Supine Long Arc Quad: AROM;Left;5 reps;Seated Heel Slides: AAROM;10 reps;Left;Both;Supine;AROM    General Comments        Pertinent Vitals/Pain Pain Assessment: 0-10 Pain Score: 5  Pain Location: L hip  upper femur Pain Descriptors / Indicators: Grimacing;Guarding;Discomfort Pain Intervention(s): Monitored during session    Home Living                      Prior Function            PT Goals (current goals can now be found in the care plan section)  Acute Rehab PT Goals Patient Stated Goal: to rehab and then home PT Goal Formulation: With patient Time For Goal Achievement: 10/10/20 Potential to Achieve Goals: Fair Progress towards PT goals: Progressing toward goals    Frequency    Min 3X/week      PT Plan Current plan remains appropriate    Co-evaluation              AM-PAC PT "6 Clicks" Mobility   Outcome Measure  Help needed turning from your back to your side while in a flat bed without using bedrails?: A Lot Help needed moving from lying on your back to sitting on the side of a flat bed without using bedrails?: A Lot Help needed moving to and from a bed to a chair (including a wheelchair)?: Total Help needed standing up from a chair using your arms (e.g., wheelchair  or bedside chair)?: Total Help needed to walk in hospital room?: Total Help needed climbing 3-5 steps with a railing? : Total 6 Click Score: 8    End of Session   Activity Tolerance: Patient tolerated treatment well;Patient limited by pain;Other (comment) (WBS) Patient left: in chair;with call bell/phone within reach Nurse Communication: Mobility status PT Visit Diagnosis: Other abnormalities of gait and mobility (R26.89);Muscle weakness (generalized) (M62.81);Pain Pain - Right/Left: Left Pain - part of body: Hip     Time: 1212-1253 PT Time Calculation (min) (ACUTE ONLY): 41 min  Charges:  $Therapeutic Exercise: 8-22 mins $Therapeutic Activity: 23-37 mins                     09/30/2020  Andrew Carne., PT Acute Rehabilitation Services [phone]  (pager) [phone]  (office)   Andrew Dominguez 09/30/2020, 1:03 PM

## 2020-09-30 NOTE — Progress Notes (Signed)
[facility] PROGRESS NOTE    Progress Note  BYRANT VALENT  KCL:[id number] DOB: 13-Dec-1955 DOA: 09/24/2020 PCP: Baxter Hire, MD     Brief Narrative:   Andrew Dominguez is an 63 y.o. male past medical history significant for chronic atrial fibrillation on Eliquis, essential hypertension, diabetes mellitus type 2, diabetic neuropathy known lytic lesion, with unintentional weight loss COPD with pulmonary nodules and liver masses, splenic and kidneys masses concerning for metastatic lung cancer presents to the hospital with severe left hip pain for about a month  Assessment/Plan:   Pathologic pathologic left hip fracture: Status post intramedullary nailing on 09/25/2020. Continue analgesic appreciate orthopedics assistance. Physical therapy evaluated the patient recommended skilled nursing facility he is awaiting placement.  A. fib with RVR: Heart rate slightly elevated increase Cardizem continue atenolol. Continue Eliquis. Continue IV as needed need to keep the heart rate less than 90.  Essential hypertension: Pressure seems to be well controlled on current regimen continue to hold losartan since we are increasing Cardizem.  Fever: Has remained afebrile procalcitonin low yield.  He has no new complaints tolerating his diet.  Metastatic lung cancer: Follow-up with oncology as an outpatient.  Next line plan for biopsy on 10/04/2020 Eliquis should be held 48 hours prior to biopsy.  Hypokalemia: Repleted now improved.  Diabetes mellitus type 2: With an A1c of 5.4, resume his Metformin and Jardiance.  Normocytic anemia: Hemoglobin remained stable continue to monitor intermittently.  COPD: Stable.    DVT prophylaxis: lovenox Family Communication:none Status is: Inpatient  Remains inpatient appropriate because:Hemodynamically unstable   Dispo:  Patient From: Home  Planned Disposition: Home with Health Care Svc  Expected discharge date: 10/03/20  Medically  stable for discharge: Yes, awaiting skilled nursing facility placement.  Code Status:     Code Status Orders  (From admission, onward)         Start     Ordered   09/24/20 2038  Full code  Continuous        09/24/20 2041        Code Status History    This patient has a current code status but no historical code status.   Advance Care Planning Activity        IV Access:    Peripheral IV   Procedures and diagnostic studies:   No results found.   Medical Consultants:    None.  Anti-Infectives:   none  Subjective:    Andrew Dominguez has no new complaints. Objective:    Vitals:   09/30/20 0300 09/30/20 0400 09/30/20 0500 09/30/20 0600  BP:  121/77  137/89  Pulse: 89 92 93 (!) 103  Resp: 17 17 16 16   Temp:      TempSrc:      SpO2: 93% 95% 95% 95%  Weight:   132.1 kg   Height:       SpO2: 95 % O2 Flow Rate (L/min): 1 L/min   Intake/Output Summary (Last 24 hours) at 09/30/2020 0731 Last data filed at 09/30/2020 0600 Gross per 24 hour  Intake 1860 ml  Output 4800 ml  Net -2940 ml   Filed Weights   09/25/20 1409 09/29/20 0500 09/30/20 0500  Weight: 128.8 kg 132 kg 132.1 kg    Exam: General exam: In no acute distress. Respiratory system: Good air movement and clear to auscultation. Cardiovascular system: S1 & S2 heard, RRR. No JVD. Gastrointestinal system: Abdomen is nondistended, soft and nontender.  Extremities: No pedal edema. Skin: No  rashes, lesions or ulcers  Data Reviewed:    Labs: Basic Metabolic Panel: Recent Labs  Lab 09/24/20 1154 09/24/20 1154 09/25/20 0316 09/25/20 0316 09/26/20 0256 09/26/20 0256 09/27/20 0152 09/28/20 0456  NA 136  --  139  --  138  --  137 138  K 3.6   < > 4.0   < > 3.4*   < > 4.2 4.0  CL 101  --  102  --  100  --  100 101  CO2 21*  --  25  --  24  --  25 28  GLUCOSE 169*  --  100*  --  136*  --  118* 121*  BUN 29*  --  22  --  21  --  18 15  CREATININE 1.24  --  1.19  --  1.18  --  0.99  0.92  CALCIUM 9.6  --  9.6  --  9.0  --  8.8* 9.0  MG  --   --  1.7  --  1.8  --   --   --    < > = values in this interval not displayed.   GFR Estimated Creatinine Clearance: 114 mL/min (by C-G formula based on SCr of 0.92 mg/dL). Liver Function Tests: Recent Labs  Lab 09/24/20 1154  AST 15  ALT 12  ALKPHOS 56  BILITOT 0.9  PROT 7.5  ALBUMIN 3.9   No results for input(s): LIPASE, AMYLASE in the last 168 hours. No results for input(s): AMMONIA in the last 168 hours. Coagulation profile No results for input(s): INR, PROTIME in the last 168 hours. COVID-19 Labs  No results for input(s): DDIMER, FERRITIN, LDH, CRP in the last 72 hours.  Lab Results  Component Value Date   [place] NEGATIVE 09/24/2020   SARSCOV2NAA NOT DETECTED 04/03/2019    CBC: Recent Labs  Lab 09/24/20 1154 09/25/20 0316 09/26/20 0256 09/27/20 0152 09/28/20 0456  WBC 7.7 6.4 6.5 7.1 5.8  NEUTROABS 5.4  --   --   --   --   HGB 12.3* 11.4* 10.3* 10.2* 9.8*  HCT 39.1 36.8* 33.5* 33.3* 32.6*  MCV 81.5 83.6 82.5 84.3 84.0  PLT 248 214 182 178 154   Cardiac Enzymes: No results for input(s): CKTOTAL, CKMB, CKMBINDEX, TROPONINI in the last 168 hours. BNP (last 3 results) No results for input(s): PROBNP in the last 8760 hours. CBG: Recent Labs  Lab 09/29/20 1130 09/29/20 1606 09/29/20 2106 09/30/20 0012 09/30/20 0459  GLUCAP 194* 178* 153* 163* 134*   D-Dimer: No results for input(s): DDIMER in the last 72 hours. Hgb A1c: No results for input(s): HGBA1C in the last 72 hours. Lipid Profile: No results for input(s): CHOL, HDL, LDLCALC, TRIG, CHOLHDL, LDLDIRECT in the last 72 hours. Thyroid function studies: No results for input(s): TSH, T4TOTAL, T3FREE, THYROIDAB in the last 72 hours.  Invalid input(s): FREET3 Anemia work up: No results for input(s): VITAMINB12, FOLATE, FERRITIN, TIBC, IRON, RETICCTPCT in the last 72 hours. Sepsis Labs: Recent Labs  Lab 09/24/20 1154 09/24/20 1350  09/25/20 0316 09/26/20 0256 09/27/20 0152 09/27/20 1200 09/28/20 0456 09/29/20 0149  PROCALCITON  --   --   --   --   --  0.13 <0.10 <0.10  WBC   < >  --  6.4 6.5 7.1  --  5.8  --   LATICACIDVEN  --  1.8  --   --   --  1.9  --   --    < > =  values in this interval not displayed.   Microbiology Recent Results (from the past 240 hour(s))  Resp Panel by RT-PCR (Flu A&B, Covid) Nasopharyngeal Swab     Status: None   Collection Time: 09/24/20  3:38 PM   Specimen: Nasopharyngeal Swab; Nasopharyngeal(NP) swabs in vial transport medium  Result Value Ref Range Status   SARS Coronavirus 2 by RT PCR NEGATIVE NEGATIVE Final    Comment: (NOTE) SARS-CoV-2 target nucleic acids are NOT DETECTED.  The SARS-CoV-2 RNA is generally detectable in upper respiratory specimens during the acute phase of infection. The lowest concentration of SARS-CoV-2 viral copies this assay can detect is 138 copies/mL. A negative result does not preclude SARS-Cov-2 infection and should not be used as the sole basis for treatment or other patient management decisions. A negative result may occur with  improper specimen collection/handling, submission of specimen other than nasopharyngeal swab, presence of viral mutation(s) within the areas targeted by this assay, and inadequate number of viral copies(<138 copies/mL). A negative result must be combined with clinical observations, patient history, and epidemiological information. The expected result is Negative.  Fact Sheet for Patients:  EntrepreneurPulse.com.au  Fact Sheet for Healthcare Providers:  IncredibleEmployment.be  This test is no t yet approved or cleared by the [place] FDA and  has been authorized for detection and/or diagnosis of SARS-CoV-2 by FDA under an Emergency Use Authorization (EUA). This EUA will remain  in effect (meaning this test can be used) for the duration of the COVID-19 declaration under  Section 564(b)(1) of the Act, 21 U.S.C.section 360bbb-3(b)(1), unless the authorization is terminated  or revoked sooner.       Influenza A by PCR NEGATIVE NEGATIVE Final   Influenza B by PCR NEGATIVE NEGATIVE Final    Comment: (NOTE) The Xpert Xpress SARS-CoV-2/FLU/RSV plus assay is intended as an aid in the diagnosis of influenza from Nasopharyngeal swab specimens and should not be used as a sole basis for treatment. Nasal washings and aspirates are unacceptable for Xpert Xpress SARS-CoV-2/FLU/RSV testing.  Fact Sheet for Patients: EntrepreneurPulse.com.au  Fact Sheet for Healthcare Providers: IncredibleEmployment.be  This test is not yet approved or cleared by the [place] FDA and has been authorized for detection and/or diagnosis of SARS-CoV-2 by FDA under an Emergency Use Authorization (EUA). This EUA will remain in effect (meaning this test can be used) for the duration of the COVID-19 declaration under Section 564(b)(1) of the Act, 21 U.S.C. section 360bbb-3(b)(1), unless the authorization is terminated or revoked.  Performed at [facility], [address] [place], [place] [address]   MRSA PCR Screening     Status: None   Collection Time: 09/24/20 11:24 PM   Specimen: Nasopharyngeal  Result Value Ref Range Status   MRSA by PCR NEGATIVE NEGATIVE Final    Comment:        The GeneXpert MRSA Assay (FDA approved for NASAL specimens only), is one component of a comprehensive MRSA colonization surveillance program. It is not intended to diagnose MRSA infection nor to guide or monitor treatment for MRSA infections. Performed at [facility], [place] [address], [place], [place] [address]   Culture, blood (routine x 2)     Status: None (Preliminary result)   Collection Time: 09/27/20 11:59 AM   Specimen: BLOOD LEFT HAND  Result Value Ref Range Status   Specimen Description BLOOD LEFT HAND  Final   Special  Requests   Final    BOTTLES DRAWN AEROBIC AND ANAEROBIC Blood Culture adequate volume  Culture   Final    NO GROWTH 2 DAYS Performed at [facility], [place] [address] [place], [place] [address]    Report Status PENDING  Incomplete  Culture, blood (routine x 2)     Status: None (Preliminary result)   Collection Time: 09/27/20 12:00 PM   Specimen: BLOOD RIGHT HAND  Result Value Ref Range Status   Specimen Description BLOOD RIGHT HAND  Final   Special Requests   Final    BOTTLES DRAWN AEROBIC AND ANAEROBIC Blood Culture adequate volume   Culture   Final    NO GROWTH 2 DAYS Performed at [facility], [place] [address] [place], [place] [address]    Report Status PENDING  Incomplete     Medications:   . apixaban  5 mg Oral BID  . atenolol  12.5 mg Oral Daily  . diltiazem  60 mg Oral Q6H  . docusate sodium  100 mg Oral BID  . feeding supplement (GLUCERNA SHAKE)  237 mL Oral TID BM  . fenofibrate  54 mg Oral Daily  . furosemide  20 mg Oral Daily  . gabapentin  200 mg Oral TID  . insulin aspart  0-9 Units Subcutaneous Q4H  . metFORMIN  1,000 mg Oral BID WC  . multivitamin with minerals  1 tablet Oral Daily  . rosuvastatin  5 mg Oral Daily  . umeclidinium-vilanterol  1 puff Inhalation Daily   Continuous Infusions: . methocarbamol (ROBAXIN) IV        LOS: 6 days   Charlynne Cousins  [facility]  09/30/2020, 7:31 AM

## 2020-09-30 NOTE — Progress Notes (Signed)
Pt transferred from chair to bed with assist x 3. Able to bear weight on R leg. Used walker in front of patient for his support. Pivoted into bed. Pt tolerated well, however did report pain 7/10 to left hip. Medicated with oxy.( See MAR)

## 2020-09-30 NOTE — Progress Notes (Signed)
Nutrition Follow-up  DOCUMENTATION CODES:   Obesity unspecified  INTERVENTION:  Continue Glucerna Shake po TID, each supplement provides 220 kcal and 10 grams of protein.  Encourage adequate PO intake.   NUTRITION DIAGNOSIS:   Increased nutrient needs related to post-op healing as evidenced by estimated needs; ongoing  GOAL:   Patient will meet greater than or equal to 90% of their needs; met  MONITOR:   PO intake, Supplement acceptance, Labs, Weight trends, Skin, I & O's  REASON FOR ASSESSMENT:   Consult Assessment of nutrition requirement/status, Hip fracture protocol  ASSESSMENT:   Andrew Dominguez is a 64 y.o. male with medical history significant for chronic A. fib on Eliquis, essential hypertension, type 2 diabetes, diabetic polyneuropathy, known lytic lesions, unintentional weight loss, COPD, pulmonary nodules, liver masses, splenic mass, left kidney mass, with concern for metastatic lung cancer.  Patient presents with left hip pain with onset 1 month ago and gradually worsening.  Pt admitted with acute patholgocial lt hip fracture.   11/21- s/p Procedure(s): INTRAMEDULLARY (IM) NAIL HIP SCREW   Meal completion has been 100%. Pt currently has Glucerna Shake ordered and has been consuming them. RD to continue with current orders to aid in caloric and protein needs as well as in healing. Pt encouraged to eat his food at meals and to drink his supplements. Per MD, plan for lung biopsy 11/30. Awaiting SNF placement.   Labs and medications reviewed.   Diet Order:   Diet Order            Diet Carb Modified Fluid consistency: Thin; Room service appropriate? Yes  Diet effective now                 EDUCATION NEEDS:   Education needs have been addressed  Skin:  Skin Assessment: Reviewed RN Assessment Skin Integrity Issues:: Incisions Incisions: closed lt hip  Last BM:  11/25  Height:   Ht Readings from Last 1 Encounters:  09/25/20 6' 0.01" (1.829 m)     Weight:   Wt Readings from Last 1 Encounters:  09/30/20 132.1 kg    BMI:  Body mass index is 39.49 kg/m.  Estimated Nutritional Needs:   Kcal:  2000-2200  Protein:  105-120 grams  Fluid:  > 2 L  Corrin Parker, MS, RD, LDN RD pager number/after hours weekend pager number on Amion.

## 2020-10-01 ENCOUNTER — Encounter (HOSPITAL_COMMUNITY): Payer: Self-pay | Admitting: Internal Medicine

## 2020-10-01 DIAGNOSIS — R160 Hepatomegaly, not elsewhere classified: Secondary | ICD-10-CM

## 2020-10-01 LAB — GLUCOSE, CAPILLARY
Glucose-Capillary: 113 mg/dL — ABNORMAL HIGH (ref 70–99)
Glucose-Capillary: 124 mg/dL — ABNORMAL HIGH (ref 70–99)
Glucose-Capillary: 129 mg/dL — ABNORMAL HIGH (ref 70–99)
Glucose-Capillary: 142 mg/dL — ABNORMAL HIGH (ref 70–99)
Glucose-Capillary: 144 mg/dL — ABNORMAL HIGH (ref 70–99)
Glucose-Capillary: 187 mg/dL — ABNORMAL HIGH (ref 70–99)

## 2020-10-01 NOTE — TOC Progression Note (Signed)
Transition of Care [facility]) - Progression Note    Patient Details  Name: Andrew Dominguez MRN: [id number] Date of Birth: 16-May-1956  [facility] CM/SW [place], LCSW Phone Number: 10/01/2020, 8:41 AM  Clinical Narrative: CSW met with patient and reviewed current bed offers for SNF placement. CSW noted patient reported he wanted a facility in [facility] and wanted to see if any of the facilities would respond prior to accepting a bed offer at one of the locations in [employer]. CSW notes current bed offers for [facility], [facility], and [facility].    Expected Discharge Plan: [place] Barriers to Discharge: Continued Medical Work up, Ship broker, SNF Pending bed offer  Expected Discharge Plan and Services Expected Discharge Plan: [place] In-house Referral: Clinical Social Work     Living arrangements for the past 2 months: Single Family Home                                       Social Determinants of Health (SDOH) Interventions    Readmission Risk Interventions No flowsheet data found.

## 2020-10-01 NOTE — Progress Notes (Addendum)
[facility] PROGRESS NOTE    Progress Note  Andrew Dominguez  VQQ:[id number] DOB: [birth year] DOA: 09/24/2020 PCP: Baxter Hire, MD     Brief Narrative:   Andrew Dominguez is an 64 y.o. male past medical history significant for chronic atrial fibrillation on Eliquis, essential hypertension, diabetes mellitus type 2, diabetic neuropathy known lytic lesion, with unintentional weight loss COPD with pulmonary nodules and liver masses, splenic and kidneys masses concerning for metastatic lung cancer presents to the hospital with severe left hip pain for about a month  Assessment/Plan:   Pathologic pathologic left hip fracture: Status post intramedullary nailing on 09/25/2020. Orthopedic surgery recommended not weightbearing of the left lower extremity but is okay for transfer. Hold Eliquis, Continue heparin for DVT prophylaxis follow-up with them in 7 days. Physical therapy evaluated the patient recommended skilled nursing facility he is awaiting placement.  A. fib with RVR: Heart rate slightly elevated increase Cardizem continue atenolol. We will hold Eliquis today for liver biopsy on 10/03/2020 to be done by interventional radiology. Continue IV as needed need to keep the heart rate less than 90.  Essential hypertension: Pressure seems to be well controlled on current regimen continue to hold losartan since we are increasing Cardizem.  Fever: Has remained afebrile procalcitonin low yield.  He has no new complaints tolerating his diet.  Metastatic lung cancer: Follow-up with oncology as an outpatient.  Has a scheduled biopsy of the liver on 10/04/2020 as an outpatient we will go ahead and do it in house on 10/03/2020 will need to hold Eliquis for 48 hours prior to biopsy.  Hypokalemia: Repleted now improved.  Diabetes mellitus type 2: With an A1c of 5.4, resume his Metformin and Jardiance.  Normocytic anemia: Hemoglobin remained stable continue to monitor  intermittently.  COPD: Stable.    DVT prophylaxis: lovenox Family Communication:none Status is: Inpatient  Remains inpatient appropriate because:Hemodynamically unstable   Dispo:  Patient From: Home  Planned Disposition: Home with [facility] Care [facility]  Expected discharge date: 10/03/20  Medically stable for discharge: Yes, awaiting skilled nursing facility placement.  Code Status:     Code Status Orders  (From admission, onward)         Start     Ordered   09/24/20 2038  Full code  Continuous        09/24/20 2041        Code Status History    This patient has a current code status but no historical code status.   Advance Care Planning Activity        IV Access:    Peripheral IV   Procedures and diagnostic studies:   No results found.   Medical Consultants:    None.  Anti-Infectives:   none  Subjective:    Andrew Dominguez feels great tolerating his diet he relates he is ready to get to work. Objective:    Vitals:   09/30/20 1600 09/30/20 1954 09/30/20 2300 10/01/20 0349  BP: 117/70 137/62 (!) 157/94 135/86  Pulse: 82 94 91 81  Resp: 19 16 14 18   Temp: 98 F (36.7 C) 99.8 F (37.7 C) 98.5 F (36.9 C)   TempSrc: Axillary Axillary Oral   SpO2: 93% 95% 96% 91%  Weight:      Height:       SpO2: 91 % O2 Flow Rate (L/min): 1 L/min   Intake/Output Summary (Last 24 hours) at 10/01/2020 0744 Last data filed at 10/01/2020 0523 Gross per 24 hour  Intake --  Output 4250 ml  Net -4250 ml   Filed Weights   09/25/20 1409 09/29/20 0500 09/30/20 0500  Weight: 128.8 kg 132 kg 132.1 kg    Exam: General exam: In no acute distress. Respiratory system: Good air movement and clear to auscultation. Cardiovascular system: S1 & S2 heard, RRR. No JVD. Gastrointestinal system: Abdomen is nondistended, soft and nontender.  Extremities: No pedal edema. Skin: No rashes, lesions or ulcers Psychiatry: Judgement and insight appear normal. Mood & affect  appropriate. Data Reviewed:    Labs: Basic Metabolic Panel: Recent Labs  Lab 09/24/20 1154 09/24/20 1154 09/25/20 0316 09/25/20 0316 09/26/20 0256 09/26/20 0256 09/27/20 0152 09/28/20 0456  NA 136  --  139  --  138  --  137 138  K 3.6   < > 4.0   < > 3.4*   < > 4.2 4.0  CL 101  --  102  --  100  --  100 101  CO2 21*  --  25  --  24  --  25 28  GLUCOSE 169*  --  100*  --  136*  --  118* 121*  BUN 29*  --  22  --  21  --  18 15  CREATININE 1.24  --  1.19  --  1.18  --  0.99 0.92  CALCIUM 9.6  --  9.6  --  9.0  --  8.8* 9.0  MG  --   --  1.7  --  1.8  --   --   --    < > = values in this interval not displayed.   GFR Estimated Creatinine Clearance: 114 mL/min (by C-G formula based on SCr of 0.92 mg/dL). Liver Function Tests: Recent Labs  Lab 09/24/20 1154  AST 15  ALT 12  ALKPHOS 56  BILITOT 0.9  PROT 7.5  ALBUMIN 3.9   No results for input(s): LIPASE, AMYLASE in the last 168 hours. No results for input(s): AMMONIA in the last 168 hours. Coagulation profile No results for input(s): INR, PROTIME in the last 168 hours. COVID-19 Labs  No results for input(s): DDIMER, FERRITIN, LDH, CRP in the last 72 hours.  Lab Results  Component Value Date   [place] NEGATIVE 09/24/2020   SARSCOV2NAA NOT DETECTED 04/03/2019    CBC: Recent Labs  Lab 09/24/20 1154 09/25/20 0316 09/26/20 0256 09/27/20 0152 09/28/20 0456  WBC 7.7 6.4 6.5 7.1 5.8  NEUTROABS 5.4  --   --   --   --   HGB 12.3* 11.4* 10.3* 10.2* 9.8*  HCT 39.1 36.8* 33.5* 33.3* 32.6*  MCV 81.5 83.6 82.5 84.3 84.0  PLT 248 214 182 178 154   Cardiac Enzymes: No results for input(s): CKTOTAL, CKMB, CKMBINDEX, TROPONINI in the last 168 hours. BNP (last 3 results) No results for input(s): PROBNP in the last 8760 hours. CBG: Recent Labs  Lab 09/30/20 1636 09/30/20 2108 10/01/20 0008 10/01/20 0449 10/01/20 0729  GLUCAP 176* 177* 142* 124* 129*   D-Dimer: No results for input(s): DDIMER in the last  72 hours. Hgb A1c: No results for input(s): HGBA1C in the last 72 hours. Lipid Profile: No results for input(s): CHOL, HDL, LDLCALC, TRIG, CHOLHDL, LDLDIRECT in the last 72 hours. Thyroid function studies: No results for input(s): TSH, T4TOTAL, T3FREE, THYROIDAB in the last 72 hours.  Invalid input(s): FREET3 Anemia work up: No results for input(s): VITAMINB12, FOLATE, FERRITIN, TIBC, IRON, RETICCTPCT in the last 72 hours. Sepsis Labs: Recent Labs  Lab  09/24/20 1154 09/24/20 1350 09/25/20 0316 09/26/20 0256 09/27/20 0152 09/27/20 1200 09/28/20 0456 09/29/20 0149  PROCALCITON  --   --   --   --   --  0.13 <0.10 <0.10  WBC   < >  --  6.4 6.5 7.1  --  5.8  --   LATICACIDVEN  --  1.8  --   --   --  1.9  --   --    < > = values in this interval not displayed.   Microbiology Recent Results (from the past 240 hour(s))  Resp Panel by RT-PCR (Flu A&B, Covid) Nasopharyngeal Swab     Status: None   Collection Time: 09/24/20  3:38 PM   Specimen: Nasopharyngeal Swab; Nasopharyngeal(NP) swabs in vial transport medium  Result Value Ref Range Status   SARS Coronavirus 2 by RT PCR NEGATIVE NEGATIVE Final    Comment: (NOTE) SARS-CoV-2 target nucleic acids are NOT DETECTED.  The SARS-CoV-2 RNA is generally detectable in upper respiratory specimens during the acute phase of infection. The lowest concentration of SARS-CoV-2 viral copies this assay can detect is 138 copies/mL. A negative result does not preclude SARS-Cov-2 infection and should not be used as the sole basis for treatment or other patient management decisions. A negative result may occur with  improper specimen collection/handling, submission of specimen other than nasopharyngeal swab, presence of viral mutation(s) within the areas targeted by this assay, and inadequate number of viral copies(<138 copies/mL). A negative result must be combined with clinical observations, patient history, and epidemiological information. The  expected result is Negative.  Fact Sheet for Patients:  EntrepreneurPulse.com.au  Fact Sheet for Healthcare Providers:  IncredibleEmployment.be  This test is no t yet approved or cleared by the [place] FDA and  has been authorized for detection and/or diagnosis of SARS-CoV-2 by FDA under an Emergency Use Authorization (EUA). This EUA will remain  in effect (meaning this test can be used) for the duration of the COVID-19 declaration under Section 564(b)(1) of the Act, 21 U.S.C.section 360bbb-3(b)(1), unless the authorization is terminated  or revoked sooner.       Influenza A by PCR NEGATIVE NEGATIVE Final   Influenza B by PCR NEGATIVE NEGATIVE Final    Comment: (NOTE) The Xpert Xpress SARS-CoV-2/FLU/RSV plus assay is intended as an aid in the diagnosis of influenza from Nasopharyngeal swab specimens and should not be used as a sole basis for treatment. Nasal washings and aspirates are unacceptable for Xpert Xpress SARS-CoV-2/FLU/RSV testing.  Fact Sheet for Patients: EntrepreneurPulse.com.au  Fact Sheet for Healthcare Providers: IncredibleEmployment.be  This test is not yet approved or cleared by the [place] FDA and has been authorized for detection and/or diagnosis of SARS-CoV-2 by FDA under an Emergency Use Authorization (EUA). This EUA will remain in effect (meaning this test can be used) for the duration of the COVID-19 declaration under Section 564(b)(1) of the Act, 21 U.S.C. section 360bbb-3(b)(1), unless the authorization is terminated or revoked.  Performed at [facility], [address] [place], [place] [address]   MRSA PCR Screening     Status: None   Collection Time: 09/24/20 11:24 PM   Specimen: Nasopharyngeal  Result Value Ref Range Status   MRSA by PCR NEGATIVE NEGATIVE Final    Comment:        The GeneXpert MRSA Assay (FDA approved for NASAL specimens only),  is one component of a comprehensive MRSA colonization surveillance program. It is not intended to diagnose MRSA infection nor to  guide or monitor treatment for MRSA infections. Performed at [facility], [place] [address], [place], [place] [address]   Culture, blood (routine x 2)     Status: None (Preliminary result)   Collection Time: 09/27/20 11:59 AM   Specimen: BLOOD LEFT HAND  Result Value Ref Range Status   Specimen Description BLOOD LEFT HAND  Final   Special Requests   Final    BOTTLES DRAWN AEROBIC AND ANAEROBIC Blood Culture adequate volume   Culture   Final    NO GROWTH 4 DAYS Performed at [facility], [place] [address] [place], [place] [address]    Report Status PENDING  Incomplete  Culture, blood (routine x 2)     Status: None (Preliminary result)   Collection Time: 09/27/20 12:00 PM   Specimen: BLOOD RIGHT HAND  Result Value Ref Range Status   Specimen Description BLOOD RIGHT HAND  Final   Special Requests   Final    BOTTLES DRAWN AEROBIC AND ANAEROBIC Blood Culture adequate volume   Culture   Final    NO GROWTH 4 DAYS Performed at [facility], [place] [address] [place], [place] [address]    Report Status PENDING  Incomplete     Medications:   . apixaban  5 mg Oral BID  . atenolol  12.5 mg Oral Daily  . diltiazem  90 mg Oral Q6H  . docusate sodium  100 mg Oral BID  . feeding supplement (GLUCERNA SHAKE)  237 mL Oral TID BM  . fenofibrate  54 mg Oral Daily  . furosemide  20 mg Oral Daily  . gabapentin  200 mg Oral TID  . insulin aspart  0-9 Units Subcutaneous Q4H  . metFORMIN  1,000 mg Oral BID WC  . multivitamin with minerals  1 tablet Oral Daily  . rosuvastatin  5 mg Oral Daily  . umeclidinium-vilanterol  1 puff Inhalation Daily   Continuous Infusions: . methocarbamol (ROBAXIN) IV        LOS: 7 days   Charlynne Cousins  [facility]  10/01/2020, 7:44 AM

## 2020-10-01 NOTE — Consult Note (Signed)
Chief Complaint: Patient was seen in consultation today for liver lesion  Referring Physician(s): Dr. Mike Gip  Supervising Physician: Dr. Serafina Royals  Patient Status: [facility] - In-pt  History of Present Illness: Andrew Dominguez is a 64 y.o. male with past medical history of COPD, CM, HTN, a fib on Eliquis presented to [facility] ED with severe hip pain.  Patient found to have a pathologic left hip fracture and underwent intramedullary nailing 09/25/20.  Patient was scheduled for ultrasound-guided liver lesion biopsy with IR at [facility] 11/30, however now with plans to discharge to SNF for ongoing rehabilitation.  IR now consulted for consideration of liver lesion biopsy prior to discharge.   Patient assessed at bedside.  He states that he is planning to discharge to rehab early next and would like to have "everything taken care of before I go." Patient case reviewed with IR MD, he is approved for liver lesion biopsy and we will attempt to accommodate him early on the schedule.   Past Medical History:  Diagnosis Date  . Anemia   . Arthritis    knees  . COPD (chronic obstructive pulmonary disease) ([place])   . Diabetes mellitus without complication ([place])   . Hyperlipemia   . Hypertension   . Hypertriglyceridemia   . Hypogonadism in male   . Numbness of foot    bilateral  . Obesity   . Tubular adenoma of colon   . Vitamin D deficiency     Past Surgical History:  Procedure Laterality Date  . CATARACT EXTRACTION W/PHACO Right 04/07/2019   Procedure: CATARACT EXTRACTION PHACO AND INTRAOCULAR LENS PLACEMENT ([place])  RIGHT DIABETIC;  Surgeon: Birder Robson, MD;  Location: [place];  Service: Ophthalmology;  Laterality: Right;  Diabetic - oral meds  . COLONOSCOPY    . COLONOSCOPY WITH PROPOFOL N/A 05/16/2015   Procedure: COLONOSCOPY WITH PROPOFOL;  Surgeon: Manya Silvas, MD;  Location: [facility] ENDOSCOPY;  Service: Endoscopy;  Laterality: N/A;  . FEMUR IM NAIL Left 09/25/2020   Procedure:  INTRAMEDULLARY (IM) NAIL HIP SCREW;  Surgeon: Meredith Pel, MD;  Location: [place];  Service: Orthopedics;  Laterality: Left;  . ORIF FINGER / THUMB FRACTURE Left    thumb, 1970s    Allergies: Sulfa antibiotics  Medications: Prior to Admission medications   Medication Sig Start Date End Date Taking? Authorizing Provider  amLODipine (NORVASC) 10 MG tablet Take 10 mg by mouth daily.   Yes [provider]  atenolol (TENORMIN) 50 MG tablet Take 50 mg by mouth daily.   Yes [provider]  ELIQUIS 5 MG TABS tablet Take 5 mg by mouth 2 (two) times daily. 07/28/20  Yes [provider]  empagliflozin (JARDIANCE) 25 MG TABS tablet Take 25 mg by mouth daily.  02/23/20 02/22/21 Yes [provider]  fenofibrate 54 MG tablet Take 54 mg by mouth daily.   Yes [provider]  furosemide (LASIX) 20 MG tablet Take 20 mg by mouth daily.  02/03/18  Yes [provider]  gabapentin (NEURONTIN) 100 MG capsule Take 100 mg by mouth 3 (three) times daily.   Yes [provider]  hydrochlorothiazide (HYDRODIURIL) 25 MG tablet Take 25 mg by mouth daily.   Yes [provider]  losartan (COZAAR) 100 MG tablet Take 100 mg by mouth daily.   Yes [provider]  metFORMIN (GLUCOPHAGE) 1000 MG tablet Take 1,000 mg by mouth 2 (two) times daily with a meal.   Yes [provider]  rosuvastatin (CRESTOR)  5 MG tablet Take 5 mg by mouth daily.   Yes [provider]  Umeclidinium-Vilanterol (ANORO ELLIPTA IN) Inhale into the lungs daily.   Yes [provider]  VICTOZA 18 MG/3ML SOPN Inject 1.8 mg into the skin at bedtime.  12/01/16  Yes [provider]  albuterol (VENTOLIN HFA) 108 (90 Base) MCG/ACT inhaler Inhale into the lungs every 6 (six) hours as needed for wheezing or shortness of breath. Patient not taking: Reported on 09/25/2020    [provider]  aspirin EC 81 MG tablet Take 81 mg by mouth  daily. Patient not taking: Reported on 08/23/2020    [provider]  B Complex Vitamins (VITAMIN B COMPLEX PO) Take by mouth daily.  Patient not taking: Reported on 09/24/2020    [provider]  Blood Glucose Monitoring Suppl (GLUCOCOM BLOOD GLUCOSE MONITOR) DEVI 1 each by XX route as directed. 07/29/15   [provider]  glucose blood (ONETOUCH ULTRA) test strip Use 3 (three) times daily E11.29 08/21/19   [provider]  Insulin Pen Needle (B-D UF III MINI PEN NEEDLES) 31G X 5 MM MISC Use once daily E11.29 11/25/19   [provider]     Family History  Problem Relation Age of Onset  . Lung cancer Father   . Prostate cancer Neg Hx   . Bladder Cancer Neg Hx   . Kidney cancer Neg Hx     Social History   Socioeconomic History  . Marital status: Legally Separated    Spouse name: Not on file  . Number of children: Not on file  . Years of education: Not on file  . Highest education level: Not on file  Occupational History  . Not on file  Tobacco Use  . Smoking status: Former Smoker    Quit date: 2000    Years since quitting: 21.9  . Smokeless tobacco: Never Used  Vaping Use  . Vaping Use: Never used  Substance and Sexual Activity  . Alcohol use: Yes    Alcohol/week: 14.0 standard drinks    Types: 14 Shots of liquor per week  . Drug use: No  . Sexual activity: Not on file  Other Topics Concern  . Not on file  Social History Narrative  . Not on file   Social Determinants of Health   Financial Resource Strain:   . Difficulty of Paying Living Expenses: Not on file  Food Insecurity:   . Worried About Charity fundraiser in the Last Year: Not on file  . Ran Out of Food in the Last Year: Not on file  Transportation Needs:   . Lack of Transportation (Medical): Not on file  . Lack of Transportation (Non-Medical): Not on file  Physical Activity:   . Days of Exercise per Week: Not on file  . Minutes of Exercise per Session: Not on  file  Stress:   . Feeling of Stress : Not on file  Social Connections:   . Frequency of Communication with Friends and Family: Not on file  . Frequency of Social Gatherings with Friends and Family: Not on file  . Attends Religious Services: Not on file  . Active Member of Clubs or Organizations: Not on file  . Attends Archivist Meetings: Not on file  . Marital Status: Not on file     Review of Systems: A 12 point ROS discussed and pertinent positives are indicated in the HPI above.  All other systems are negative.  Review  of Systems  Constitutional: Negative for fatigue and fever.  Respiratory: Positive for shortness of breath. Negative for cough.   Cardiovascular: Negative for chest pain.  Gastrointestinal: Negative for abdominal pain, constipation, diarrhea and nausea.  Genitourinary: Negative for dysuria.  Musculoskeletal: Negative for back pain.       Hip pain  Psychiatric/Behavioral: Negative for behavioral problems and confusion.    Vital Signs: BP 120/75   Pulse 87   Temp 98.5 F (36.9 C) (Oral)   Resp 16   Ht 6' 0.01" (1.829 m)   Wt 291 lb 3.6 oz (132.1 kg)   SpO2 91%   BMI 39.49 kg/m   Physical Exam Vitals and nursing note reviewed.  Constitutional:      General: He is not in acute distress.    Appearance: Normal appearance. He is not ill-appearing.  HENT:     Mouth/Throat:     Mouth: Mucous membranes are moist.     Pharynx: Oropharynx is clear.  Cardiovascular:     Rate and Rhythm: Normal rate and regular rhythm.  Pulmonary:     Effort: Pulmonary effort is normal.     Breath sounds: Normal breath sounds.  Abdominal:     General: Abdomen is flat. There is no distension.     Palpations: Abdomen is soft.     Tenderness: There is no abdominal tenderness.  Skin:    General: Skin is warm and dry.  Neurological:     General: No focal deficit present.     Mental Status: He is alert and oriented to person, place, and time. Mental status is at  baseline.  Psychiatric:        Mood and Affect: Mood normal.        Behavior: Behavior normal.        Thought Content: Thought content normal.        Judgment: Judgment normal.      MD Evaluation Airway: WNL Heart: WNL Abdomen: WNL Chest/ Lungs: WNL ASA  Classification: 3 Mallampati/Airway Score: Two   Imaging: DG Chest 1 View  Result Date: 09/24/2020 CLINICAL DATA:  Palpable object left femoral fracture.  Preop. EXAM: CHEST  1 VIEW COMPARISON:  None. FINDINGS: Lungs are adequately inflated without focal airspace consolidation or effusion. Borderline cardiomegaly. Mild degenerative change of the spine. IMPRESSION: 1. No acute cardiopulmonary disease. 2. Borderline cardiomegaly. Electronically Signed   By: Marin Olp M.D.   On: 09/24/2020 15:17   DG Pelvis 1-2 Views  Result Date: 09/24/2020 CLINICAL DATA:  Possible left femoral pathologic fracture. EXAM: PELVIS - 1-2 VIEW COMPARISON:  CT 08/24/2020 FINDINGS: Mild symmetric degenerative change of the hips. Evidence of displaced subtrochanteric fracture of the left proximal femur through known lytic lesion compatible with pathologic fracture. Degenerative change of the spine. IMPRESSION: Displaced subtrochanteric fracture of the left proximal femur through known lytic lesion compatible with pathologic fracture. Electronically Signed   By: Marin Olp M.D.   On: 09/24/2020 15:16   DG CHEST PORT 1 VIEW  Result Date: 09/27/2020 CLINICAL DATA:  Fever EXAM: PORTABLE CHEST 1 VIEW COMPARISON:  09/24/2020.  CT 09/01/2020. FINDINGS: Heart size is normal. Mediastinal shadows are normal. 2-3 cm indistinct density seen lateral to the right hilum could be an infiltrate or mass. Most of the abnormalities shown on the recent chest CT cannot be seen by radiography. IMPRESSION: 2-3 cm indistinct density lateral to the right hilum could be an infiltrate or mass. Most of the abnormalities shown on the recent chest CT cannot  be seen by radiography.  Electronically Signed   By: Nelson Chimes M.D.   On: 09/27/2020 14:09   DG C-Arm 1-60 Min  Result Date: 09/25/2020 CLINICAL DATA:  Left IM nail EXAM: DG C-ARM 1-60 MIN CONTRAST:  None FLUOROSCOPY TIME:  Fluoroscopy Time:  2 minutes 52 seconds Number of Acquired Spot Images: 7 COMPARISON:  09/24/2020 FINDINGS: Seven low resolution intraoperative spot views of the left femur are submitted postoperatively. The initial images demonstrate pathologic fracture through the proximal left femur. There is subsequent intramedullary rod with proximal and distal screw fixation of the left femur. IMPRESSION: Intraoperative fluoroscopic assistance provided during intramedullary rod fixation of left femur fracture. Electronically Signed   By: Donavan Foil M.D.   On: 09/25/2020 15:43   DG HIP OPERATIVE UNILAT W OR W/O PELVIS LEFT  Result Date: 09/25/2020 CLINICAL DATA:  Left proximal femur fracture repair. EXAM: OPERATIVE LEFT HIP (WITH PELVIS IF PERFORMED) 7 VIEWS TECHNIQUE: Fluoroscopic spot image(s) were submitted for interpretation post-operatively. FLUOROSCOPY TIME:  2 minutes and 52 seconds. COMPARISON:  September 24, 2020 FINDINGS: Screws were placed through the femoral neck and an intramedullary rod is placed through the femur with 2 distal interlocking screws. IMPRESSION: Left proximal femur fracture repair as above. Electronically Signed   By: Dorise Bullion III M.D   On: 09/25/2020 15:57   DG Femur Min 2 Views Left  Result Date: 09/24/2020 CLINICAL DATA:  Possible left femoral pathologic fracture. EXAM: LEFT FEMUR 2 VIEWS COMPARISON:  CT 08/24/2020 FINDINGS: Exam demonstrates a displaced intertrochanteric fracture of the left proximal femur through known lytic lesion compatible with pathologic fracture. Osteoarthritic change of the left knee. IMPRESSION: Displaced intertrochanteric fracture of the left proximal femur through known lytic lesion compatible with pathologic fracture. Electronically Signed   By:  Marin Olp M.D.   On: 09/24/2020 15:15   DG FEMUR PORT MIN 2 VIEWS LEFT  Result Date: 09/25/2020 CLINICAL DATA:  Postop EXAM: LEFT FEMUR PORTABLE 2 VIEWS COMPARISON:  09/24/2020 FINDINGS: Interval intramedullary rod and screw fixation of the left femur for pathologic left lower trochanteric and subtrochanteric femoral fracture. Near anatomic alignment. Gas in the soft tissues consistent with recent surgery. IMPRESSION: Interval intramedullary rod and screw fixation of pathologic proximal left femoral fracture. Electronically Signed   By: Donavan Foil M.D.   On: 09/25/2020 15:44    Labs:  CBC: Recent Labs    09/25/20 0316 09/26/20 0256 09/27/20 0152 09/28/20 0456  WBC 6.4 6.5 7.1 5.8  HGB 11.4* 10.3* 10.2* 9.8*  HCT 36.8* 33.5* 33.3* 32.6*  PLT 214 182 178 154    COAGS: No results for input(s): INR, APTT in the last 8760 hours.  BMP: Recent Labs    09/25/20 0316 09/26/20 0256 09/27/20 0152 09/28/20 0456  NA 139 138 137 138  K 4.0 3.4* 4.2 4.0  CL 102 100 100 101  CO2 25 24 25 28   GLUCOSE 100* 136* 118* 121*  BUN 22 21 18 15   CALCIUM 9.6 9.0 8.8* 9.0  CREATININE 1.19 1.18 0.99 0.92  GFRNONAA >60 >60 >60 >60    LIVER FUNCTION TESTS: Recent Labs    08/23/20 0845 09/24/20 1154  BILITOT 0.6 0.9  AST 14* 15  ALT 10 12  ALKPHOS 49 56  PROT 6.8 7.5  ALBUMIN 3.6 3.9    TUMOR MARKERS: No results for input(s): AFPTM, CEA, CA199, CHROMGRNA in the last 8760 hours.  Assessment and Plan: Liver lesions Patient presented to [facility] ED with hip pain.  Found to have pathologic fracture s/p nailing.  Now to d/c to SNF/rehab.  Planning for liver lesion biopsy as an outpatient on 11/30, however IR to attempt to accommodate as an inpatient early next week per Dr. Serafina Royals.  Patient understands that his case is an add-on.  His Eliquis is being held through the weekend.  NPO p MN Sunday night for possible procedure Monday if schedule allows.  INR ordered.   Risks and benefits  of biopsy was discussed with the patient and/or patient's family including, but not limited to bleeding, infection, damage to adjacent structures or low yield requiring additional tests.  All of the questions were answered and there is agreement to proceed.  Consent signed and in chart.  Thank you for this interesting consult.  I greatly enjoyed meeting Andrew Dominguez and look forward to participating in their care.  A copy of this report was sent to the requesting provider on this date.  Electronically Signed: Docia Barrier, PA 10/01/2020, 3:38 PM   I spent a total of 40 Minutes    in face to face in clinical consultation, greater than 50% of which was counseling/coordinating care for liver lesions.

## 2020-10-02 LAB — CULTURE, BLOOD (ROUTINE X 2)
Culture: NO GROWTH
Culture: NO GROWTH
Special Requests: ADEQUATE
Special Requests: ADEQUATE

## 2020-10-02 LAB — GLUCOSE, CAPILLARY
Glucose-Capillary: 141 mg/dL — ABNORMAL HIGH (ref 70–99)
Glucose-Capillary: 129 mg/dL — ABNORMAL HIGH (ref 70–99)
Glucose-Capillary: 155 mg/dL — ABNORMAL HIGH (ref 70–99)
Glucose-Capillary: 171 mg/dL — ABNORMAL HIGH (ref 70–99)

## 2020-10-02 MED ORDER — APIXABAN 5 MG PO TABS
5.0000 mg | ORAL_TABLET | Freq: Two times a day (BID) | ORAL | Status: DC
  Administered 2020-10-02 – 2020-10-05 (×7): 5 mg via ORAL
  Filled 2020-10-02 (×7): qty 1

## 2020-10-02 MED ORDER — PNEUMOCOCCAL VAC POLYVALENT 25 MCG/0.5ML IJ INJ
0.5000 mL | INJECTION | INTRAMUSCULAR | Status: AC
  Administered 2020-10-03: 0.5 mL via INTRAMUSCULAR
  Filled 2020-10-02: qty 0.5

## 2020-10-02 NOTE — Progress Notes (Signed)
[facility] PROGRESS NOTE    Progress Note  Andrew Dominguez  GYF:[id number] DOB: 11/20/1955 DOA: 09/24/2020 PCP: Andrew Hire, MD     Brief Narrative:   Andrew Dominguez is an 64 y.o. male past medical history significant for chronic atrial fibrillation on Eliquis, essential hypertension, diabetes mellitus type 2, diabetic neuropathy known lytic lesion, with unintentional weight loss COPD with pulmonary nodules and liver masses, splenic and kidneys masses concerning for metastatic lung cancer presents to the hospital with severe left hip pain for about a month  Assessment/Plan:   Pathologic pathologic left hip fracture: Status post intramedullary nailing on 09/25/2020. Orthopedic surgery recommended not weightbearing of the left lower extremity but is okay for transfer. Resuming Eliquis, liver biopsy has been postponed as if bone biopsy shows [place] malignancy we have an answer and received on an intervention.  I have already informed the nurse practitioners for interventional radiology. Continue his diet. Patient is medically stable to be transferred to skilled nursing facility.  A. fib with RVR: Heart rate slightly elevated increase Cardizem continue atenolol. Resume Eliquis discontinue heparin. Continue IV as needed need to keep the heart rate less than 90.  Essential hypertension: Pressure seems to be well controlled on current regimen continue to hold losartan since we are increasing Cardizem.  Metastatic lung cancer: Follow-up with oncology as an outpatient.  Resume Eliquis spoke to the oncologist if bone biopsy gives the results of metastatic disease he will not need the liver biopsy.  They will follow-up on the results of the bone biopsy and if needed we will schedule her liver biopsy as an outpatient.   Hypokalemia: Repleted now improved.  Diabetes mellitus type 2: With an A1c of 5.4, resume his Metformin and Jardiance.  Normocytic anemia: Hemoglobin remained  stable continue to monitor intermittently.  COPD: Stable.    DVT prophylaxis: lovenox Family Communication:none Status is: Inpatient  Remains inpatient appropriate because:Hemodynamically unstable   Dispo:  Patient From: Home  Planned Disposition: Home with Health Care S[facility]  Expected discharge date: 10/03/20  Medically stable for discharge: Yes, awaiting skilled nursing facility placement.  Code Status:     Code Status Orders  (From admission, onward)         Start     Ordered   09/24/20 2038  Full code  Continuous        09/24/20 2041        Code Status History    This patient has a current code status but no historical code status.   Advance Care Planning Activity        IV Access:    Peripheral IV   Procedures and diagnostic studies:   No results found.   Medical Consultants:    None.  Anti-Infectives:   none  Subjective:    Andrew Dominguez no complaints. Objective:    Vitals:   10/01/20 1700 10/01/20 2034 10/02/20 0053 10/02/20 0409  BP:  109/62 125/77 116/67  Pulse: 96 78 93 84  Resp: 19 18 17 17   Temp:  98.7 F (37.1 C) 98.6 F (37 C) 98.3 F (36.8 C)  TempSrc:  Oral Oral Oral  SpO2: 91% 97% 96% 97%  Weight:      Height:       SpO2: 97 % O2 Flow Rate (L/min): 1 L/min   Intake/Output Summary (Last 24 hours) at 10/02/2020 0801 Last data filed at 10/02/2020 0538 Gross per 24 hour  Intake 960 ml  Output 4450 ml  Net -  3490 ml   Filed Weights   09/25/20 1409 09/29/20 0500 09/30/20 0500  Weight: 128.8 kg 132 kg 132.1 kg    Exam: General exam: In no acute distress. Respiratory system: Good air movement and clear to auscultation. Cardiovascular system: S1 & S2 heard, RRR. No JVD. Gastrointestinal system: Abdomen is nondistended, soft and nontender.  Extremities: No pedal edema. Skin: No rashes, lesions or ulcers Psychiatry: Judgement and insight appear normal. Mood & affect appropriate. Data Reviewed:     Labs: Basic Metabolic Panel: Recent Labs  Lab 09/26/20 0256 09/26/20 0256 09/27/20 0152 09/28/20 0456  NA 138  --  137 138  K 3.4*   < > 4.2 4.0  CL 100  --  100 101  CO2 24  --  25 28  GLUCOSE 136*  --  118* 121*  BUN 21  --  18 15  CREATININE 1.18  --  0.99 0.92  CALCIUM 9.0  --  8.8* 9.0  MG 1.8  --   --   --    < > = values in this interval not displayed.   GFR Estimated Creatinine Clearance: 114 mL/min (by C-G formula based on SCr of 0.92 mg/dL). Liver Function Tests: No results for input(s): AST, ALT, ALKPHOS, BILITOT, PROT, ALBUMIN in the last 168 hours. No results for input(s): LIPASE, AMYLASE in the last 168 hours. No results for input(s): AMMONIA in the last 168 hours. Coagulation profile No results for input(s): INR, PROTIME in the last 168 hours. COVID-19 Labs  No results for input(s): DDIMER, FERRITIN, LDH, CRP in the last 72 hours.  Lab Results  Component Value Date   SARSCOV2NAA NEGATIVE 09/24/2020   SARSCOV2NAA NOT DETECTED 04/03/2019    CBC: Recent Labs  Lab 09/26/20 0256 09/27/20 0152 09/28/20 0456  WBC 6.5 7.1 5.8  HGB 10.3* 10.2* 9.8*  HCT 33.5* 33.3* 32.6*  MCV 82.5 84.3 84.0  PLT 182 178 154   Cardiac Enzymes: No results for input(s): CKTOTAL, CKMB, CKMBINDEX, TROPONINI in the last 168 hours. BNP (last 3 results) No results for input(s): PROBNP in the last 8760 hours. CBG: Recent Labs  Lab 10/01/20 0729 10/01/20 1213 10/01/20 1532 10/01/20 2032 10/02/20 0725  GLUCAP 129* 113* 187* 144* 141*   D-Dimer: No results for input(s): DDIMER in the last 72 hours. Hgb A1c: No results for input(s): HGBA1C in the last 72 hours. Lipid Profile: No results for input(s): CHOL, HDL, LDLCALC, TRIG, CHOLHDL, LDLDIRECT in the last 72 hours. Thyroid function studies: No results for input(s): TSH, T4TOTAL, T3FREE, THYROIDAB in the last 72 hours.  Invalid input(s): FREET3 Anemia work up: No results for input(s): VITAMINB12, FOLATE,  FERRITIN, TIBC, IRON, RETICCTPCT in the last 72 hours. Sepsis Labs: Recent Labs  Lab 09/26/20 0256 09/27/20 0152 09/27/20 1200 09/28/20 0456 09/29/20 0149  PROCALCITON  --   --  0.13 <0.10 <0.10  WBC 6.5 7.1  --  5.8  --   LATICACIDVEN  --   --  1.9  --   --    Microbiology Recent Results (from the past 240 hour(s))  Resp Panel by RT-PCR (Flu A&B, Covid) Nasopharyngeal Swab     Status: None   Collection Time: 09/24/20  3:38 PM   Specimen: Nasopharyngeal Swab; Nasopharyngeal(NP) swabs in vial transport medium  Result Value Ref Range Status   SARS Coronavirus 2 by RT PCR NEGATIVE NEGATIVE Final    Comment: (NOTE) SARS-CoV-2 target nucleic acids are NOT DETECTED.  The SARS-CoV-2 RNA is generally detectable in  upper respiratory specimens during the acute phase of infection. The lowest concentration of SARS-CoV-2 viral copies this assay can detect is 138 copies/mL. A negative result does not preclude SARS-Cov-2 infection and should not be used as the sole basis for treatment or other patient management decisions. A negative result may occur with  improper specimen collection/handling, submission of specimen other than nasopharyngeal swab, presence of viral mutation(s) within the areas targeted by this assay, and inadequate number of viral copies(<138 copies/mL). A negative result must be combined with clinical observations, patient history, and epidemiological information. The expected result is Negative.  Fact Sheet for Patients:  EntrepreneurPulse.com.au  Fact Sheet for Healthcare Providers:  IncredibleEmployment.be  This test is no t yet approved or cleared by the [place] FDA and  has been authorized for detection and/or diagnosis of SARS-CoV-2 by FDA under an Emergency Use Authorization (EUA). This EUA will remain  in effect (meaning this test can be used) for the duration of the COVID-19 declaration under Section 564(b)(1) of the  Act, 21 U.S.C.section 360bbb-3(b)(1), unless the authorization is terminated  or revoked sooner.       Influenza A by PCR NEGATIVE NEGATIVE Final   Influenza B by PCR NEGATIVE NEGATIVE Final    Comment: (NOTE) The Xpert Xpress SARS-CoV-2/FLU/RSV plus assay is intended as an aid in the diagnosis of influenza from Nasopharyngeal swab specimens and should not be used as a sole basis for treatment. Nasal washings and aspirates are unacceptable for Xpert Xpress SARS-CoV-2/FLU/RSV testing.  Fact Sheet for Patients: EntrepreneurPulse.com.au  Fact Sheet for Healthcare Providers: IncredibleEmployment.be  This test is not yet approved or cleared by the [place] FDA and has been authorized for detection and/or diagnosis of SARS-CoV-2 by FDA under an Emergency Use Authorization (EUA). This EUA will remain in effect (meaning this test can be used) for the duration of the COVID-19 declaration under Section 564(b)(1) of the Act, 21 U.S.C. section 360bbb-3(b)(1), unless the authorization is terminated or revoked.  Performed at [facility], [address] [place], [place] [address]   MRSA PCR Screening     Status: None   Collection Time: 09/24/20 11:24 PM   Specimen: Nasopharyngeal  Result Value Ref Range Status   MRSA by PCR NEGATIVE NEGATIVE Final    Comment:        The GeneXpert MRSA Assay (FDA approved for NASAL specimens only), is one component of a comprehensive MRSA colonization surveillance program. It is not intended to diagnose MRSA infection nor to guide or monitor treatment for MRSA infections. Performed at [facility], [place] [address], [place], [place] [address]   Culture, blood (routine x 2)     Status: None   Collection Time: 09/27/20 11:59 AM   Specimen: BLOOD LEFT HAND  Result Value Ref Range Status   Specimen Description BLOOD LEFT HAND  Final   Special Requests   Final    BOTTLES DRAWN AEROBIC AND  ANAEROBIC Blood Culture adequate volume   Culture   Final    NO GROWTH 5 DAYS Performed at [facility], [place] [address], [place], [place] [address]    Report Status 10/02/2020 FINAL  Final  Culture, blood (routine x 2)     Status: None   Collection Time: 09/27/20 12:00 PM   Specimen: BLOOD RIGHT HAND  Result Value Ref Range Status   Specimen Description BLOOD RIGHT HAND  Final   Special Requests   Final    BOTTLES DRAWN AEROBIC AND ANAEROBIC  Blood Culture adequate volume   Culture   Final    NO GROWTH 5 DAYS Performed at [facility], [place] [address], [place], [place] [address]    Report Status 10/02/2020 FINAL  Final     Medications:    atenolol  12.5 mg Oral Daily   diltiazem  90 mg Oral Q6H   docusate sodium  100 mg Oral BID   feeding supplement (GLUCERNA SHAKE)  237 mL Oral TID BM   fenofibrate  54 mg Oral Daily   furosemide  20 mg Oral Daily   gabapentin  200 mg Oral TID   insulin aspart  0-9 Units Subcutaneous Q4H   metFORMIN  1,000 mg Oral BID WC   multivitamin with minerals  1 tablet Oral Daily   rosuvastatin  5 mg Oral Daily   umeclidinium-vilanterol  1 puff Inhalation Daily   Continuous Infusions:  methocarbamol (ROBAXIN) IV        LOS: 8 days   Charlynne Cousins  [facility]  10/02/2020, 8:01 AM

## 2020-10-03 ENCOUNTER — Other Ambulatory Visit: Payer: Self-pay | Admitting: Hematology and Oncology

## 2020-10-03 ENCOUNTER — Inpatient Hospital Stay (HOSPITAL_COMMUNITY): Payer: 59

## 2020-10-03 LAB — GLUCOSE, CAPILLARY
Glucose-Capillary: 116 mg/dL — ABNORMAL HIGH (ref 70–99)
Glucose-Capillary: 182 mg/dL — ABNORMAL HIGH (ref 70–99)
Glucose-Capillary: 152 mg/dL — ABNORMAL HIGH (ref 70–99)
Glucose-Capillary: 153 mg/dL — ABNORMAL HIGH (ref 70–99)

## 2020-10-03 LAB — RESP PANEL BY RT-PCR (FLU A&B, COVID) ARPGX2
Influenza A by PCR: NEGATIVE
Influenza B by PCR: NEGATIVE
SARS Coronavirus 2 by RT PCR: NEGATIVE

## 2020-10-03 LAB — PROTIME-INR
INR: 1.3 — ABNORMAL HIGH (ref 0.8–1.2)
Prothrombin Time: 15.5 seconds — ABNORMAL HIGH (ref 11.4–15.2)

## 2020-10-03 LAB — SURGICAL PATHOLOGY

## 2020-10-03 MED ORDER — METHOCARBAMOL 500 MG PO TABS: 500.0000 mg | ORAL_TABLET | Freq: Four times a day (QID) | ORAL | 0 refills | Status: AC | PRN

## 2020-10-03 MED ORDER — DILTIAZEM HCL ER COATED BEADS 180 MG PO CP24: 360.0000 mg | ORAL_CAPSULE | Freq: Every day | ORAL | Status: DC

## 2020-10-03 MED ORDER — DILTIAZEM HCL ER COATED BEADS 180 MG PO CP24
300.0000 mg | ORAL_CAPSULE | Freq: Every day | ORAL | Status: DC
  Administered 2020-10-03: 300 mg via ORAL
  Filled 2020-10-03: qty 1

## 2020-10-03 MED ORDER — OXYCODONE HCL 5 MG PO TABS
5.0000 mg | ORAL_TABLET | ORAL | 0 refills | Status: AC | PRN
Start: 2020-10-03 — End: ?

## 2020-10-03 MED ORDER — DILTIAZEM HCL ER COATED BEADS 300 MG PO CP24: 300.0000 mg | ORAL_CAPSULE | Freq: Every day | ORAL | 11 refills | Status: AC

## 2020-10-03 NOTE — Plan of Care (Signed)
  Problem: Education: Goal: Knowledge of General Education information will improve Description: Including pain rating scale, medication(s)/side effects and non-pharmacologic comfort measures Outcome: Progressing   Problem: Health Behavior/Discharge Planning: Goal: Ability to manage health-related needs will improve Outcome: Progressing   Problem: Clinical Measurements: Goal: Ability to maintain clinical measurements within normal limits will improve Outcome: Progressing Goal: Will remain free from infection Outcome: Progressing Goal: Diagnostic test results will improve Outcome: Progressing Goal: Respiratory complications will improve Outcome: Progressing Goal: Cardiovascular complication will be avoided Outcome: Progressing   Problem: Activity: Goal: Risk for activity intolerance will decrease Outcome: Progressing   Problem: Nutrition: Goal: Adequate nutrition will be maintained Outcome: Progressing   Problem: Coping: Goal: Level of anxiety will decrease Outcome: Progressing   Problem: Elimination: Goal: Will not experience complications related to bowel motility Outcome: Progressing Goal: Will not experience complications related to urinary retention Outcome: Progressing   Problem: Pain Managment: Goal: General experience of comfort will improve Outcome: Progressing   Problem: Safety: Goal: Ability to remain free from injury will improve Outcome: Progressing   Problem: Skin Integrity: Goal: Risk for impaired skin integrity will decrease Outcome: Progressing   Problem: Education: Goal: Knowledge of General Education information will improve Description: Including pain rating scale, medication(s)/side effects and non-pharmacologic comfort measures Outcome: Progressing   Problem: Health Behavior/Discharge Planning: Goal: Ability to manage health-related needs will improve Outcome: Progressing   Problem: Clinical Measurements: Goal: Ability to maintain  clinical measurements within normal limits will improve Outcome: Progressing Goal: Will remain free from infection Outcome: Progressing Goal: Diagnostic test results will improve Outcome: Progressing Goal: Respiratory complications will improve Outcome: Progressing Goal: Cardiovascular complication will be avoided Outcome: Progressing   Problem: Activity: Goal: Risk for activity intolerance will decrease Outcome: Progressing   Problem: Nutrition: Goal: Adequate nutrition will be maintained Outcome: Progressing   Problem: Coping: Goal: Level of anxiety will decrease Outcome: Progressing   Problem: Elimination: Goal: Will not experience complications related to bowel motility Outcome: Progressing Goal: Will not experience complications related to urinary retention Outcome: Progressing   Problem: Pain Managment: Goal: General experience of comfort will improve Outcome: Progressing   Problem: Safety: Goal: Ability to remain free from injury will improve Outcome: Progressing   Problem: Skin Integrity: Goal: Risk for impaired skin integrity will decrease Outcome: Progressing   Problem: Education: Goal: Required Educational Video(s) Outcome: Progressing   Problem: Clinical Measurements: Goal: Ability to maintain clinical measurements within normal limits will improve Outcome: Progressing Goal: Postoperative complications will be avoided or minimized Outcome: Progressing   Problem: Skin Integrity: Goal: Demonstration of wound healing without infection will improve Outcome: Progressing

## 2020-10-03 NOTE — Discharge Summary (Addendum)
Physician Discharge Summary  Andrew Dominguez ZWC:[id number] DOB: 05/25/1956 DOA: 09/24/2020  PCP: Baxter Hire, MD  Admit date: 09/24/2020 Discharge date: 10/03/2020  Admitted From: Home Disposition:  SNF  Recommendations for Outpatient Follow-up:  1. Follow up with PCP in 1-2 weeks 2. Please obtain BMP/CBC in one week 3. Follow-up with oncology in 2 to 4 weeks for results of bone biopsy.  Home Health:No Equipment/Devices:None  Discharge Condition:Stable CODE STATUS:Full Diet recommendation: Heart Healthy   Brief/Interim Summary: 64 y.o. male past medical history significant for chronic atrial fibrillation on Eliquis, essential hypertension, diabetes mellitus type 2, diabetic neuropathy known lytic lesion, with unintentional weight loss COPD with pulmonary nodules and liver masses, splenic and kidneys masses concerning for metastatic lung cancer presents to the hospital with severe left hip pain for about a month.   Discharge Diagnoses:  Active Problems:   Liver metastases (HCC)   Lung nodules   S/p left hip fracture   Atrial fibrillation with RVR (HCC)   Fever  Pathologic left hip fracture: Status post intramedullary nailing on 09/25/2020 orthopedic surgery recommended no weightbearing of the left lower extremity but is okay to transfer. Physical therapy evaluated the patient and recommended skilled nursing facility. His Eliquis was resumed no further changes.  A. fib with RVR: Heart rate slightly elevated, he was started on Cardizem continue Tylenol which she will continue as an outpatient. Continue his Eliquis no further changes.  Essential hypertension: Antihypertensive medications were held on admission his blood pressure started to trend up and they will be restarted as an outpatient no changes made.  Metastatic lung cancer: Follow-up with oncology as an outpatient. His bone biopsy results are pending.  Hypokalemia: Replete orally now resolved.  Diabetes  mellitus type 2: With an A1c of 5.4 no changes made to his medication.  Normocytic anemia: Likely due to acute blood loss anemia has remained stable no further changes.  Discharge Instructions  Discharge Instructions    Diet - low sodium heart healthy   Complete by: As directed    Increase activity slowly   Complete by: As directed    No wound care   Complete by: As directed      Allergies as of 10/03/2020      Reactions   Sulfa Antibiotics Rash      Medication List    STOP taking these medications   hydrochlorothiazide 25 MG tablet Commonly known as: HYDRODIURIL     TAKE these medications   albuterol 108 (90 Base) MCG/ACT inhaler Commonly known as: VENTOLIN HFA Inhale into the lungs every 6 (six) hours as needed for wheezing or shortness of breath.   amLODipine 10 MG tablet Commonly known as: NORVASC Take 10 mg by mouth daily.   ANORO ELLIPTA IN Inhale into the lungs daily.   aspirin EC 81 MG tablet Take 81 mg by mouth daily.   atenolol 50 MG tablet Commonly known as: TENORMIN Take 50 mg by mouth daily.   B-D UF III MINI PEN NEEDLES 31G X 5 MM Misc Generic drug: Insulin Pen Needle Use once daily E11.29   diltiazem 300 MG 24 hr capsule Commonly known as: Cardizem CD Take 1 capsule (300 mg total) by mouth daily.   Eliquis 5 MG Tabs tablet Generic drug: apixaban Take 5 mg by mouth 2 (two) times daily.   empagliflozin 25 MG Tabs tablet Commonly known as: JARDIANCE Take 25 mg by mouth daily.   fenofibrate 54 MG tablet Take 54 mg by mouth daily.  furosemide 20 MG tablet Commonly known as: LASIX Take 20 mg by mouth daily.   gabapentin 100 MG capsule Commonly known as: NEURONTIN Take 100 mg by mouth 3 (three) times daily.   GlucoCom Blood Glucose Monitor Devi 1 each by XX route as directed.   losartan 100 MG tablet Commonly known as: COZAAR Take 100 mg by mouth daily.   metFORMIN 1000 MG tablet Commonly known as: GLUCOPHAGE Take 1,000 mg  by mouth 2 (two) times daily with a meal.   methocarbamol 500 MG tablet Commonly known as: ROBAXIN Take 1 tablet (500 mg total) by mouth every 6 (six) hours as needed for muscle spasms.   OneTouch Ultra test strip Generic drug: glucose blood Use 3 (three) times daily E11.29   oxyCODONE 5 MG immediate release tablet Commonly known as: Oxy IR/ROXICODONE Take 1-2 tablets (5-10 mg total) by mouth every 4 (four) hours as needed for moderate pain (pain score 4-6).   rosuvastatin 5 MG tablet Commonly known as: CRESTOR Take 5 mg by mouth daily.   Victoza 18 MG/3ML Sopn Generic drug: liraglutide Inject 1.8 mg into the skin at bedtime.   VITAMIN B COMPLEX PO Take by mouth daily.       Allergies  Allergen Reactions  . Sulfa Antibiotics Rash    Consultations:  Orthopedic surgery   Procedures/Studies: DG Chest 1 View  Result Date: 09/24/2020 CLINICAL DATA:  Palpable object left femoral fracture.  Preop. EXAM: CHEST  1 VIEW COMPARISON:  None. FINDINGS: Lungs are adequately inflated without focal airspace consolidation or effusion. Borderline cardiomegaly. Mild degenerative change of the spine. IMPRESSION: 1. No acute cardiopulmonary disease. 2. Borderline cardiomegaly. Electronically Signed   By: Marin Olp M.D.   On: 09/24/2020 15:17   DG Pelvis 1-2 Views  Result Date: 09/24/2020 CLINICAL DATA:  Possible left femoral pathologic fracture. EXAM: PELVIS - 1-2 VIEW COMPARISON:  CT 08/24/2020 FINDINGS: Mild symmetric degenerative change of the hips. Evidence of displaced subtrochanteric fracture of the left proximal femur through known lytic lesion compatible with pathologic fracture. Degenerative change of the spine. IMPRESSION: Displaced subtrochanteric fracture of the left proximal femur through known lytic lesion compatible with pathologic fracture. Electronically Signed   By: Marin Olp M.D.   On: 09/24/2020 15:16   DG CHEST PORT 1 VIEW  Result Date: 09/27/2020 CLINICAL  DATA:  Fever EXAM: PORTABLE CHEST 1 VIEW COMPARISON:  09/24/2020.  CT 09/01/2020. FINDINGS: Heart size is normal. Mediastinal shadows are normal. 2-3 cm indistinct density seen lateral to the right hilum could be an infiltrate or mass. Most of the abnormalities shown on the recent chest CT cannot be seen by radiography. IMPRESSION: 2-3 cm indistinct density lateral to the right hilum could be an infiltrate or mass. Most of the abnormalities shown on the recent chest CT cannot be seen by radiography. Electronically Signed   By: Nelson Chimes M.D.   On: 09/27/2020 14:09   DG C-Arm 1-60 Min  Result Date: 09/25/2020 CLINICAL DATA:  Left IM nail EXAM: DG C-ARM 1-60 MIN CONTRAST:  None FLUOROSCOPY TIME:  Fluoroscopy Time:  2 minutes 52 seconds Number of Acquired Spot Images: 7 COMPARISON:  09/24/2020 FINDINGS: Seven low resolution intraoperative spot views of the left femur are submitted postoperatively. The initial images demonstrate pathologic fracture through the proximal left femur. There is subsequent intramedullary rod with proximal and distal screw fixation of the left femur. IMPRESSION: Intraoperative fluoroscopic assistance provided during intramedullary rod fixation of left femur fracture. Electronically Signed   By:  Donavan Foil M.D.   On: 09/25/2020 15:43   DG HIP OPERATIVE UNILAT W OR W/O PELVIS LEFT  Result Date: 09/25/2020 CLINICAL DATA:  Left proximal femur fracture repair. EXAM: OPERATIVE LEFT HIP (WITH PELVIS IF PERFORMED) 7 VIEWS TECHNIQUE: Fluoroscopic spot image(s) were submitted for interpretation post-operatively. FLUOROSCOPY TIME:  2 minutes and 52 seconds. COMPARISON:  September 24, 2020 FINDINGS: Screws were placed through the femoral neck and an intramedullary rod is placed through the femur with 2 distal interlocking screws. IMPRESSION: Left proximal femur fracture repair as above. Electronically Signed   By: Dorise Bullion III M.D   On: 09/25/2020 15:57   DG Femur Min 2 Views  Left  Result Date: 09/24/2020 CLINICAL DATA:  Possible left femoral pathologic fracture. EXAM: LEFT FEMUR 2 VIEWS COMPARISON:  CT 08/24/2020 FINDINGS: Exam demonstrates a displaced intertrochanteric fracture of the left proximal femur through known lytic lesion compatible with pathologic fracture. Osteoarthritic change of the left knee. IMPRESSION: Displaced intertrochanteric fracture of the left proximal femur through known lytic lesion compatible with pathologic fracture. Electronically Signed   By: Marin Olp M.D.   On: 09/24/2020 15:15   DG FEMUR PORT MIN 2 VIEWS LEFT  Result Date: 09/25/2020 CLINICAL DATA:  Postop EXAM: LEFT FEMUR PORTABLE 2 VIEWS COMPARISON:  09/24/2020 FINDINGS: Interval intramedullary rod and screw fixation of the left femur for pathologic left lower trochanteric and subtrochanteric femoral fracture. Near anatomic alignment. Gas in the soft tissues consistent with recent surgery. IMPRESSION: Interval intramedullary rod and screw fixation of pathologic proximal left femoral fracture. Electronically Signed   By: Donavan Foil M.D.   On: 09/25/2020 15:44    Subjective: No new complaints.  Discharge Exam: Vitals:   10/03/20 0418 10/03/20 0418  BP: 107/66   Pulse: 92   Resp: 16   Temp:  98.2 F (36.8 C)  SpO2: 97%    Vitals:   10/02/20 1937 10/02/20 2356 10/03/20 0418 10/03/20 0418  BP:  100/72 107/66   Pulse:  87 92   Resp:  16 16   Temp: 98.5 F (36.9 C) 99.8 F (37.7 C)  98.2 F (36.8 C)  TempSrc:  Oral    SpO2:  97% 97%   Weight:      Height:        General: Pt is alert, awake, not in acute distress Cardiovascular: RRR, S1/S2 +, no rubs, no gallops Respiratory: CTA bilaterally, no wheezing, no rhonchi Abdominal: Soft, NT, ND, bowel sounds + Extremities: no edema, no cyanosis    The results of significant diagnostics from this hospitalization (including imaging, microbiology, ancillary and laboratory) are listed below for reference.      Microbiology: Recent Results (from the past 240 hour(s))  Resp Panel by RT-PCR (Flu A&B, Covid) Nasopharyngeal Swab     Status: None   Collection Time: 09/24/20  3:38 PM   Specimen: Nasopharyngeal Swab; Nasopharyngeal(NP) swabs in vial transport medium  Result Value Ref Range Status   SARS Coronavirus 2 by RT PCR NEGATIVE NEGATIVE Final    Comment: (NOTE) SARS-CoV-2 target nucleic acids are NOT DETECTED.  The SARS-CoV-2 RNA is generally detectable in upper respiratory specimens during the acute phase of infection. The lowest concentration of SARS-CoV-2 viral copies this assay can detect is 138 copies/mL. A negative result does not preclude SARS-Cov-2 infection and should not be used as the sole basis for treatment or other patient management decisions. A negative result may occur with  improper specimen collection/handling, submission of specimen other than nasopharyngeal  swab, presence of viral mutation(s) within the areas targeted by this assay, and inadequate number of viral copies(<138 copies/mL). A negative result must be combined with clinical observations, patient history, and epidemiological information. The expected result is Negative.  Fact Sheet for Patients:  EntrepreneurPulse.com.au  Fact Sheet for Healthcare Providers:  IncredibleEmployment.be  This test is no t yet approved or cleared by the [place] FDA and  has been authorized for detection and/or diagnosis of SARS-CoV-2 by FDA under an Emergency Use Authorization (EUA). This EUA will remain  in effect (meaning this test can be used) for the duration of the COVID-19 declaration under Section 564(b)(1) of the Act, 21 U.S.C.section 360bbb-3(b)(1), unless the authorization is terminated  or revoked sooner.       Influenza A by PCR NEGATIVE NEGATIVE Final   Influenza B by PCR NEGATIVE NEGATIVE Final    Comment: (NOTE) The Xpert Xpress SARS-CoV-2/FLU/RSV plus assay is  intended as an aid in the diagnosis of influenza from Nasopharyngeal swab specimens and should not be used as a sole basis for treatment. Nasal washings and aspirates are unacceptable for Xpert Xpress SARS-CoV-2/FLU/RSV testing.  Fact Sheet for Patients: EntrepreneurPulse.com.au  Fact Sheet for Healthcare Providers: IncredibleEmployment.be  This test is not yet approved or cleared by the [place] FDA and has been authorized for detection and/or diagnosis of SARS-CoV-2 by FDA under an Emergency Use Authorization (EUA). This EUA will remain in effect (meaning this test can be used) for the duration of the COVID-19 declaration under Section 564(b)(1) of the Act, 21 U.S.C. section 360bbb-3(b)(1), unless the authorization is terminated or revoked.  Performed at [facility], [address] [place], [place] [address]   MRSA PCR Screening     Status: None   Collection Time: 09/24/20 11:24 PM   Specimen: Nasopharyngeal  Result Value Ref Range Status   MRSA by PCR NEGATIVE NEGATIVE Final    Comment:        The GeneXpert MRSA Assay (FDA approved for NASAL specimens only), is one component of a comprehensive MRSA colonization surveillance program. It is not intended to diagnose MRSA infection nor to guide or monitor treatment for MRSA infections. Performed at [facility], [place] [address], [place], [place] [address]   Culture, blood (routine x 2)     Status: None   Collection Time: 09/27/20 11:59 AM   Specimen: BLOOD LEFT HAND  Result Value Ref Range Status   Specimen Description BLOOD LEFT HAND  Final   Special Requests   Final    BOTTLES DRAWN AEROBIC AND ANAEROBIC Blood Culture adequate volume   Culture   Final    NO GROWTH 5 DAYS Performed at [facility], [place] [address], [place], [place] [address]    Report Status 10/02/2020 FINAL  Final  Culture, blood (routine x 2)     Status: None   Collection Time:  09/27/20 12:00 PM   Specimen: BLOOD RIGHT HAND  Result Value Ref Range Status   Specimen Description BLOOD RIGHT HAND  Final   Special Requests   Final    BOTTLES DRAWN AEROBIC AND ANAEROBIC Blood Culture adequate volume   Culture   Final    NO GROWTH 5 DAYS Performed at [facility], [place] [address] [place], [place] [address]    Report Status 10/02/2020 FINAL  Final     Labs: BNP (last 3 results) No results for input(s): BNP in the last 8760 hours. Basic Metabolic Panel: Recent Labs  Lab 09/27/20 0152 09/28/20 0456  NA 137 138  K 4.2 4.0  CL 100 101  CO2 25 28  GLUCOSE 118* 121*  BUN 18 15  CREATININE 0.99 0.92  CALCIUM 8.8* 9.0   Liver Function Tests: No results for input(s): AST, ALT, ALKPHOS, BILITOT, PROT, ALBUMIN in the last 168 hours. No results for input(s): LIPASE, AMYLASE in the last 168 hours. No results for input(s): AMMONIA in the last 168 hours. CBC: Recent Labs  Lab 09/27/20 0152 09/28/20 0456  WBC 7.1 5.8  HGB 10.2* 9.8*  HCT 33.3* 32.6*  MCV 84.3 84.0  PLT 178 154   Cardiac Enzymes: No results for input(s): CKTOTAL, CKMB, CKMBINDEX, TROPONINI in the last 168 hours. BNP: Invalid input(s): POCBNP CBG: Recent Labs  Lab 10/01/20 2032 10/02/20 0725 10/02/20 1210 10/02/20 1606 10/02/20 2139  GLUCAP 144* 141* 129* 155* 171*   D-Dimer No results for input(s): DDIMER in the last 72 hours. Hgb A1c No results for input(s): HGBA1C in the last 72 hours. Lipid Profile No results for input(s): CHOL, HDL, LDLCALC, TRIG, CHOLHDL, LDLDIRECT in the last 72 hours. Thyroid function studies No results for input(s): TSH, T4TOTAL, T3FREE, THYROIDAB in the last 72 hours.  Invalid input(s): FREET3 Anemia work up No results for input(s): VITAMINB12, FOLATE, FERRITIN, TIBC, IRON, RETICCTPCT in the last 72 hours. Urinalysis No results found for: COLORURINE, APPEARANCEUR, LABSPEC, [place], GLUCOSEU, [place], [place], KETONESUR, PROTEINUR,  UROBILINOGEN, NITRITE, LEUKOCYTESUR Sepsis Labs Invalid input(s): PROCALCITONIN,  WBC,  LACTICIDVEN Microbiology Recent Results (from the past 240 hour(s))  Resp Panel by RT-PCR (Flu A&B, Covid) Nasopharyngeal Swab     Status: None   Collection Time: 09/24/20  3:38 PM   Specimen: Nasopharyngeal Swab; Nasopharyngeal(NP) swabs in vial transport medium  Result Value Ref Range Status   SARS Coronavirus 2 by RT PCR NEGATIVE NEGATIVE Final    Comment: (NOTE) SARS-CoV-2 target nucleic acids are NOT DETECTED.  The SARS-CoV-2 RNA is generally detectable in upper respiratory specimens during the acute phase of infection. The lowest concentration of SARS-CoV-2 viral copies this assay can detect is 138 copies/mL. A negative result does not preclude SARS-Cov-2 infection and should not be used as the sole basis for treatment or other patient management decisions. A negative result may occur with  improper specimen collection/handling, submission of specimen other than nasopharyngeal swab, presence of viral mutation(s) within the areas targeted by this assay, and inadequate number of viral copies(<138 copies/mL). A negative result must be combined with clinical observations, patient history, and epidemiological information. The expected result is Negative.  Fact Sheet for Patients:  EntrepreneurPulse.com.au  Fact Sheet for Healthcare Providers:  IncredibleEmployment.be  This test is no t yet approved or cleared by the [place] FDA and  has been authorized for detection and/or diagnosis of SARS-CoV-2 by FDA under an Emergency Use Authorization (EUA). This EUA will remain  in effect (meaning this test can be used) for the duration of the COVID-19 declaration under Section 564(b)(1) of the Act, 21 U.S.C.section 360bbb-3(b)(1), unless the authorization is terminated  or revoked sooner.       Influenza A by PCR NEGATIVE NEGATIVE Final   Influenza B by  PCR NEGATIVE NEGATIVE Final    Comment: (NOTE) The Xpert Xpress SARS-CoV-2/FLU/RSV plus assay is intended as an aid in the diagnosis of influenza from Nasopharyngeal swab specimens and should not be used as a sole basis for treatment. Nasal washings and aspirates are unacceptable for Xpert Xpress SARS-CoV-2/FLU/RSV testing.  Fact Sheet for Patients:  EntrepreneurPulse.com.au  Fact Sheet for Healthcare Providers: IncredibleEmployment.be  This test is not yet approved or cleared by the [place] FDA and has been authorized for detection and/or diagnosis of SARS-CoV-2 by FDA under an Emergency Use Authorization (EUA). This EUA will remain in effect (meaning this test can be used) for the duration of the COVID-19 declaration under Section 564(b)(1) of the Act, 21 U.S.C. section 360bbb-3(b)(1), unless the authorization is terminated or revoked.  Performed at [facility], [address] [place], [place] [address]   MRSA PCR Screening     Status: None   Collection Time: 09/24/20 11:24 PM   Specimen: Nasopharyngeal  Result Value Ref Range Status   MRSA by PCR NEGATIVE NEGATIVE Final    Comment:        The GeneXpert MRSA Assay (FDA approved for NASAL specimens only), is one component of a comprehensive MRSA colonization surveillance program. It is not intended to diagnose MRSA infection nor to guide or monitor treatment for MRSA infections. Performed at [facility], [place] [address], [place], [place] [address]   Culture, blood (routine x 2)     Status: None   Collection Time: 09/27/20 11:59 AM   Specimen: BLOOD LEFT HAND  Result Value Ref Range Status   Specimen Description BLOOD LEFT HAND  Final   Special Requests   Final    BOTTLES DRAWN AEROBIC AND ANAEROBIC Blood Culture adequate volume   Culture   Final    NO GROWTH 5 DAYS Performed at [facility], [place] [address] [place], [place] [address]    Report Status  10/02/2020 FINAL  Final  Culture, blood (routine x 2)     Status: None   Collection Time: 09/27/20 12:00 PM   Specimen: BLOOD RIGHT HAND  Result Value Ref Range Status   Specimen Description BLOOD RIGHT HAND  Final   Special Requests   Final    BOTTLES DRAWN AEROBIC AND ANAEROBIC Blood Culture adequate volume   Culture   Final    NO GROWTH 5 DAYS Performed at [facility], [place] [address] [place],  [address]    Report Status 10/02/2020 FINAL  Final     Time coordinating discharge: Over 40 minutes  SIGNED:   Charlynne Cousins, MD  [facility] 10/03/2020, 8:35 AM Pager   If 7PM-7AM, please contact night-coverage [email or web address] Password [email or web address]

## 2020-10-03 NOTE — Progress Notes (Signed)
Physical Therapy Treatment Patient Details Name: Andrew Dominguez MRN: [id number] DOB: [birth year] Today's Date: 10/03/2020    History of Present Illness Patient is a 64 y/o male with PMH of chronic Afib on Eliquis, essential HTN, type II DM, diabetic neuropathy, known lytic lesions, unintentional weight loss, COPD, pulmonary nodules, liver massess, splenic mass, left kidney mass, with concern for metastatic lung cancer. Patient presented with L hip pain with onset 1 month ago and gradually worsening. Patient seen in ED with L hip fracture with metastatic process. Patient s/p IM nail of L femur on 11/21.Transfered to progressive unit 11/23 due to Afib with RVR    PT Comments    Pt is progressing toward goals, but slowly.  He has trouble with transitions including moving L LE to EOB, effortful scooting, but without assist once upright.  Worked on sit to stand technique with NWB on the L LE and pivoting to [facility] and recliner.  Pt unable to swing forward in the RW and must pivot w/bearing LE at this point.    Follow Up Recommendations  SNF;Supervision/Assistance - 24 hour     Equipment Recommendations  Rolling walker with 5" wheels;3in1 (PT);Wheelchair cushion (measurements PT);Wheelchair (measurements PT)    Recommendations for Other Services       Precautions / Restrictions Precautions Precautions: Fall Precaution Comments: watch HR, cardizem management Restrictions LLE Weight Bearing: Non weight bearing    Mobility  Bed Mobility Overal bed mobility: Needs Assistance Bed Mobility: Supine to Sit     Supine to sit: Min assist;HOB elevated        Transfers Overall transfer level: Needs assistance Equipment used: Rolling walker (2 wheeled) Transfers: Sit to/from [facility] Sit to Stand: Mod assist Stand pivot transfers: Mod assist       General transfer comment: cues for NWB and stability assist in the RW.  heavy mod assist from low 3 in 1.  Pt unable to  complete "swing to" pattern and just pivots his w/bearing foot.  Pt able to do adequately at keeping NWB on L LE except when sitting and not given consistent feedback.  Ambulation/Gait             General Gait Details: stand pivot only, unable to do "swing to" pattern   Stairs             Wheelchair Mobility    Modified Rankin (Stroke Patients Only)       Balance Overall balance assessment: Needs assistance Sitting-balance support: No upper extremity supported;Single extremity supported Sitting balance-Leahy Scale: Fair       Standing balance-Leahy Scale: Poor Standing balance comment: RW and additonal external support from therapists                            Cognition Arousal/Alertness: Awake/alert Behavior During Therapy: WFL for tasks assessed/performed Overall Cognitive Status: Within Functional Limits for tasks assessed                                        Exercises General Exercises - Lower Extremity Heel Slides: AAROM;10 reps;Left;Both;Supine;AROM    General Comments        Pertinent Vitals/Pain Pain Assessment: 0-10 Pain Score: 5  Pain Location: L hip  upper femur Pain Descriptors / Indicators: Guarding;Discomfort Pain Intervention(s): Monitored during session    Home Living  Prior Function            PT Goals (current goals can now be found in the care plan section) Acute Rehab PT Goals Patient Stated Goal: to rehab and then home PT Goal Formulation: With patient Time For Goal Achievement: 10/10/20 Potential to Achieve Goals: Fair Progress towards PT goals: Progressing toward goals    Frequency    Min 3X/week      PT Plan Current plan remains appropriate    Co-evaluation              AM-PAC PT "6 Clicks" Mobility   Outcome Measure  Help needed turning from your back to your side while in a flat bed without using bedrails?: A Lot Help needed moving from  lying on your back to sitting on the side of a flat bed without using bedrails?: A Lot Help needed moving to and from a bed to a chair (including a wheelchair)?: A Lot Help needed standing up from a chair using your arms (e.g., wheelchair or bedside chair)?: A Lot Help needed to walk in hospital room?: Total Help needed climbing 3-5 steps with a railing? : Total 6 Click Score: 10    End of Session   Activity Tolerance: Patient tolerated treatment well;Patient limited by pain;Other (comment)     PT Visit Diagnosis: Other abnormalities of gait and mobility (R26.89);Muscle weakness (generalized) (M62.81);Pain Pain - Right/Left: Left Pain - part of body: Hip     Time: 2993-7169 PT Time Calculation (min) (ACUTE ONLY): 31 min  Charges:  $Therapeutic Activity: 23-37 mins                     10/03/2020  Ginger Carne., PT Acute Rehabilitation Services [phone]  (pager) [phone]  (office)   Tessie Fass Kean Gautreau 10/03/2020, 2:04 PM

## 2020-10-03 NOTE — TOC Progression Note (Signed)
Transition of Care [facility]) - Progression Note    Patient Details  Name: Andrew Dominguez MRN: [id number] Date of Birth: [birth year]  Transition of Care [facility]) CM/SW [place], [place] Phone Number: 10/03/2020, 4:10 PM  Clinical Narrative:     PT chose [facility], Insurance has not come back yet, will need to DC tomorrow. [employer]# [phone]. Attempted to update facility, left a voicemail. CSW was given [phone] as call to report and 110 for the bed #. SW will continue to follow for DC planning.   Expected Discharge Plan: [place] Barriers to Discharge: Barriers Resolved  Expected Discharge Plan and Services Expected Discharge Plan: [place] In-house Referral: Clinical Social Work     Living arrangements for the past 2 months: Single Family Home Expected Discharge Date: 10/03/20                                     Social Determinants of Health (SDOH) Interventions    Readmission Risk Interventions No flowsheet data found.

## 2020-10-04 ENCOUNTER — Ambulatory Visit: Admission: RE | Admit: 2020-10-04 | Payer: 59 | Source: Ambulatory Visit

## 2020-10-04 LAB — GLUCOSE, CAPILLARY
Glucose-Capillary: 117 mg/dL — ABNORMAL HIGH (ref 70–99)
Glucose-Capillary: 123 mg/dL — ABNORMAL HIGH (ref 70–99)
Glucose-Capillary: 131 mg/dL — ABNORMAL HIGH (ref 70–99)
Glucose-Capillary: 132 mg/dL — ABNORMAL HIGH (ref 70–99)
Glucose-Capillary: 133 mg/dL — ABNORMAL HIGH (ref 70–99)
Glucose-Capillary: 140 mg/dL — ABNORMAL HIGH (ref 70–99)

## 2020-10-04 MED ORDER — DILTIAZEM HCL ER COATED BEADS 180 MG PO CP24
360.0000 mg | ORAL_CAPSULE | Freq: Every day | ORAL | Status: DC
  Administered 2020-10-04 – 2020-10-05 (×2): 360 mg via ORAL
  Filled 2020-10-04 (×2): qty 2

## 2020-10-04 MED ORDER — ATENOLOL 25 MG PO TABS
50.0000 mg | ORAL_TABLET | Freq: Every day | ORAL | Status: DC
  Administered 2020-10-04 – 2020-10-05 (×2): 50 mg via ORAL
  Filled 2020-10-04 (×2): qty 2

## 2020-10-04 NOTE — Progress Notes (Signed)
Physical Therapy Treatment Patient Details Name: Andrew Dominguez MRN: [id number] DOB: 06/09/1956 Today's Date: 10/04/2020    History of Present Illness Patient is a 64 y/o male with PMH of chronic Afib on Eliquis, essential HTN, type II DM, diabetic neuropathy, known lytic lesions, unintentional weight loss, COPD, pulmonary nodules, liver massess, splenic mass, left kidney mass, with concern for metastatic lung cancer. Patient presented with L hip pain with onset 1 month ago and gradually worsening. Patient seen in ED with L hip fracture with metastatic process. Patient s/p IM nail of L femur on 11/21.Transfered to progressive unit 11/23 due to Afib with RVR    PT Comments    Pt progressing well with PT, tolerating repeated transfers with mod multimodal cuing for NWB LLE. Pt performed LLE exercises with PT assist to maintain LE strength and ROM, executed well with increased time secondary to L hip pain. Pt maintained HR 80s-120s bpm during session. Plan to d/c to SNF, remains appropriate.   Follow Up Recommendations  SNF;Supervision/Assistance - 24 hour     Equipment Recommendations  Rolling walker with 5" wheels;3in1 (PT);Wheelchair cushion (measurements PT);Wheelchair (measurements PT)    Recommendations for Other Services       Precautions / Restrictions Precautions Precautions: Fall Precaution Comments: watch HR Restrictions Weight Bearing Restrictions: Yes LLE Weight Bearing: Non weight bearing    Mobility  Bed Mobility Overal bed mobility: Needs Assistance Bed Mobility: Rolling;Sidelying to Sit;Sit to Supine Rolling: Min assist Sidelying to sit: Min assist   Sit to supine: Mod assist   General bed mobility comments: Min assist for roll bilaterally for completion of truncal translation, min assist for sidelying to sit for trunk elevation off bed. Mod assist for LE lifting into bed.  Transfers Overall transfer level: Needs assistance Equipment used: Rolling walker (2  wheeled) Transfers: Sit to/from Stand Sit to Stand: Mod assist;From elevated surface         General transfer comment: Mod assist for power up, steadying while progressing RUE from bed to RW, steadying RW, and slow eccentric lower onto bed after stand. Stand attempts x2, verbal and tactile cuing of NWB LLE.  Ambulation/Gait             General Gait Details: standing only, pt requesting remain in bed post-session   Stairs             Wheelchair Mobility    Modified Rankin (Stroke Patients Only)       Balance Overall balance assessment: Needs assistance Sitting-balance support: No upper extremity supported;Single extremity supported Sitting balance-Leahy Scale: Fair       Standing balance-Leahy Scale: Poor Standing balance comment: RW and additonal external support from therapists                            Cognition Arousal/Alertness: Awake/alert Behavior During Therapy: WFL for tasks assessed/performed Overall Cognitive Status: Within Functional Limits for tasks assessed                                        Exercises General Exercises - Lower Extremity Quad Sets: Left;10 reps;Supine;AROM Long Arc Quad: AROM;Both;5 reps;Seated Heel Slides: AAROM;Left;10 reps;Supine Hip ABduction/ADduction: AAROM;Left;10 reps;Supine    General Comments General comments (skin integrity, edema, etc.): HR 90s-122 bpm during session      Pertinent Vitals/Pain Pain Assessment: 0-10 Pain Score: 5  Pain  Location: L hip, thigh Pain Descriptors / Indicators: Sore;Discomfort;Grimacing Pain Intervention(s): Limited activity within patient's tolerance;Monitored during session;Repositioned    Home Living                      Prior Function            PT Goals (current goals can now be found in the care plan section) Acute Rehab PT Goals Patient Stated Goal: to rehab and then home PT Goal Formulation: With patient Time For Goal  Achievement: 10/10/20 Potential to Achieve Goals: Fair Progress towards PT goals: Progressing toward goals    Frequency    Min 3X/week      PT Plan Current plan remains appropriate    Co-evaluation              AM-PAC PT "6 Clicks" Mobility   Outcome Measure  Help needed turning from your back to your side while in a flat bed without using bedrails?: A Little Help needed moving from lying on your back to sitting on the side of a flat bed without using bedrails?: A Lot Help needed moving to and from a bed to a chair (including a wheelchair)?: A Lot Help needed standing up from a chair using your arms (e.g., wheelchair or bedside chair)?: A Lot Help needed to walk in hospital room?: Total Help needed climbing 3-5 steps with a railing? : Total 6 Click Score: 11    End of Session Equipment Utilized During Treatment: Gait belt;Oxygen Activity Tolerance: Patient tolerated treatment well;Patient limited by pain Patient left: in bed;with bed alarm set;with call bell/phone within reach Nurse Communication: Mobility status PT Visit Diagnosis: Other abnormalities of gait and mobility (R26.89);Muscle weakness (generalized) (M62.81);Pain Pain - Right/Left: Left Pain - part of body: Hip     Time: 0935-1000 PT Time Calculation (min) (ACUTE ONLY): 25 min  Charges:  $Therapeutic Exercise: 8-22 mins $Therapeutic Activity: 8-22 mins                    Yoselyn Mcglade E, PT Acute Rehabilitation Services Pager [phone]  Office [phone]     Arpita Fentress D Elonda Husky 10/04/2020, 10:34 AM

## 2020-10-04 NOTE — Progress Notes (Addendum)
Patient is getting discharged to [facility], attempted to call report at [phone], [phone] and [phone] no answer. Charge nurse was notified and will pass it on to night shift nurse.

## 2020-10-04 NOTE — TOC Transition Note (Signed)
Transition of Care [facility]) - CM/SW Discharge Note   Patient Details  Name: MONIQUE GIFT MRN: [id number] Date of Birth: August 11, 1956  Transition of Care [facility]) CM/SW Contact:  Vinie Sill, [place] Phone Number: 10/04/2020, 5:31 PM   Clinical Narrative:     Patient will DC to: [place]  DC Date: 10/04/2020 Family Notified: Mandy-left voice message Transport By: Corey Harold   Per MD patient is ready for discharge. RN, patient, and facility notified of DC. Discharge Summary sent to facility. RN given number for [phone], room 110. Ambulance transport requested for patient.   Clinical Social Worker signing off.  Thurmond Butts, MSW, [place] Clinical Social Worker    Final next level of care: Skilled Nursing Facility Barriers to Discharge: Barriers Resolved   Patient Goals and CMS Choice        Discharge Placement                       Discharge Plan and Services In-house Referral: Clinical Social Work                                   Social Determinants of Health (SDOH) Interventions     Readmission Risk Interventions No flowsheet data found.

## 2020-10-04 NOTE — Discharge Summary (Addendum)
Physician Discharge Summary  Andrew Dominguez ZOX:[id number] DOB: 22-Feb-1956 DOA: 09/24/2020  PCP: Baxter Hire, MD  Admit date: 09/24/2020 Discharge date: 10/04/2020  Admitted From: Home Disposition:  SNF  Recommendations for Outpatient Follow-up:  1. Follow up with PCP in 1-2 weeks 2. Please obtain BMP/CBC in one week 3. Follow-up with oncology in 2 to 4 weeks for results of bone biopsy.  Home Health:No Equipment/Devices:None  Discharge Condition:Stable CODE STATUS:Full Diet recommendation: Heart Healthy   Brief/Interim Summary: 64 y.o. male past medical history significant for chronic atrial fibrillation on Eliquis, essential hypertension, diabetes mellitus type 2, diabetic neuropathy known lytic lesion, with unintentional weight loss COPD with pulmonary nodules and liver masses, splenic and kidneys masses concerning for metastatic lung cancer presents to the hospital with severe left hip pain for about a month.   Discharge Diagnoses:  Active Problems:   Liver metastases (HCC)   Lung nodules   S/p left hip fracture   Atrial fibrillation with RVR (HCC)   Fever Large B-cell lymphoma    Pathologic left hip fracture: Status post intramedullary nailing on 09/25/2020 orthopedic surgery recommended no weightbearing of the left lower extremity but is okay to transfer. Physical therapy evaluated the patient and recommended skilled nursing facility. His Eliquis was resumed no further changes. No changes overnight patient is still medically stable to be discharged.  A. fib with RVR: Heart rate slightly elevated, he was started on Cardizem continue Tylenol which she will continue as an outpatient. Continue his Eliquis no further changes.  Essential hypertension: Antihypertensive medications were held on admission his blood pressure started to trend up and they will be restarted as an outpatient no changes made.  Metastatic lung cancer: Follow-up with oncology as an  outpatient. His bone biopsy showed Large B-cell lymphoma    Hypokalemia: Replete orally now resolved.  Diabetes mellitus type 2: With an A1c of 5.4 no changes made to his medication.  Normocytic anemia: Likely due to acute blood loss anemia has remained stable no further changes.  Discharge Instructions  Discharge Instructions    Diet - low sodium heart healthy   Complete by: As directed    Increase activity slowly   Complete by: As directed    No wound care   Complete by: As directed      Allergies as of 10/04/2020      Reactions   Sulfa Antibiotics Rash      Medication List    STOP taking these medications   hydrochlorothiazide 25 MG tablet Commonly known as: HYDRODIURIL     TAKE these medications   albuterol 108 (90 Base) MCG/ACT inhaler Commonly known as: VENTOLIN HFA Inhale into the lungs every 6 (six) hours as needed for wheezing or shortness of breath.   amLODipine 10 MG tablet Commonly known as: NORVASC Take 10 mg by mouth daily.   ANORO ELLIPTA IN Inhale into the lungs daily.   aspirin EC 81 MG tablet Take 81 mg by mouth daily.   atenolol 50 MG tablet Commonly known as: TENORMIN Take 50 mg by mouth daily.   B-D UF III MINI PEN NEEDLES 31G X 5 MM Misc Generic drug: Insulin Pen Needle Use once daily E11.29   diltiazem 300 MG 24 hr capsule Commonly known as: Cardizem CD Take 1 capsule (300 mg total) by mouth daily.   Eliquis 5 MG Tabs tablet Generic drug: apixaban Take 5 mg by mouth 2 (two) times daily.   empagliflozin 25 MG Tabs tablet Commonly known as: JARDIANCE  Take 25 mg by mouth daily.   fenofibrate 54 MG tablet Take 54 mg by mouth daily.   furosemide 20 MG tablet Commonly known as: LASIX Take 20 mg by mouth daily.   gabapentin 100 MG capsule Commonly known as: NEURONTIN Take 100 mg by mouth 3 (three) times daily.   GlucoCom Blood Glucose Monitor Devi 1 each by XX route as directed.   losartan 100 MG tablet Commonly  known as: COZAAR Take 100 mg by mouth daily.   metFORMIN 1000 MG tablet Commonly known as: GLUCOPHAGE Take 1,000 mg by mouth 2 (two) times daily with a meal.   methocarbamol 500 MG tablet Commonly known as: ROBAXIN Take 1 tablet (500 mg total) by mouth every 6 (six) hours as needed for muscle spasms.   OneTouch Ultra test strip Generic drug: glucose blood Use 3 (three) times daily E11.29   oxyCODONE 5 MG immediate release tablet Commonly known as: Oxy IR/ROXICODONE Take 1-2 tablets (5-10 mg total) by mouth every 4 (four) hours as needed for moderate pain (pain score 4-6).   rosuvastatin 5 MG tablet Commonly known as: CRESTOR Take 5 mg by mouth daily.   Victoza 18 MG/3ML Sopn Generic drug: liraglutide Inject 1.8 mg into the skin at bedtime.   VITAMIN B COMPLEX PO Take by mouth daily.       Contact information for after-discharge care    Destination    [facility] .   Service: Skilled Nursing Contact information: [address] [address] [phone]                 Allergies  Allergen Reactions  . Sulfa Antibiotics Rash    Consultations:  Orthopedic surgery   Procedures/Studies: DG Chest 1 View  Result Date: 09/24/2020 CLINICAL DATA:  Palpable object left femoral fracture.  Preop. EXAM: CHEST  1 VIEW COMPARISON:  None. FINDINGS: Lungs are adequately inflated without focal airspace consolidation or effusion. Borderline cardiomegaly. Mild degenerative change of the spine. IMPRESSION: 1. No acute cardiopulmonary disease. 2. Borderline cardiomegaly. Electronically Signed   By: Marin Olp M.D.   On: 09/24/2020 15:17   DG Pelvis 1-2 Views  Result Date: 09/24/2020 CLINICAL DATA:  Possible left femoral pathologic fracture. EXAM: PELVIS - 1-2 VIEW COMPARISON:  CT 08/24/2020 FINDINGS: Mild symmetric degenerative change of the hips. Evidence of displaced subtrochanteric fracture of the left proximal femur  through known lytic lesion compatible with pathologic fracture. Degenerative change of the spine. IMPRESSION: Displaced subtrochanteric fracture of the left proximal femur through known lytic lesion compatible with pathologic fracture. Electronically Signed   By: Marin Olp M.D.   On: 09/24/2020 15:16   DG CHEST PORT 1 VIEW  Result Date: 09/27/2020 CLINICAL DATA:  Fever EXAM: PORTABLE CHEST 1 VIEW COMPARISON:  09/24/2020.  CT 09/01/2020. FINDINGS: Heart size is normal. Mediastinal shadows are normal. 2-3 cm indistinct density seen lateral to the right hilum could be an infiltrate or mass. Most of the abnormalities shown on the recent chest CT cannot be seen by radiography. IMPRESSION: 2-3 cm indistinct density lateral to the right hilum could be an infiltrate or mass. Most of the abnormalities shown on the recent chest CT cannot be seen by radiography. Electronically Signed   By: Nelson Chimes M.D.   On: 09/27/2020 14:09   DG C-Arm 1-60 Min  Result Date: 09/25/2020 CLINICAL DATA:  Left IM nail EXAM: DG C-ARM 1-60 MIN CONTRAST:  None FLUOROSCOPY TIME:  Fluoroscopy Time:  2  minutes 52 seconds Number of Acquired Spot Images: 7 COMPARISON:  09/24/2020 FINDINGS: Seven low resolution intraoperative spot views of the left femur are submitted postoperatively. The initial images demonstrate pathologic fracture through the proximal left femur. There is subsequent intramedullary rod with proximal and distal screw fixation of the left femur. IMPRESSION: Intraoperative fluoroscopic assistance provided during intramedullary rod fixation of left femur fracture. Electronically Signed   By: Donavan Foil M.D.   On: 09/25/2020 15:43   DG HIP OPERATIVE UNILAT W OR W/O PELVIS LEFT  Result Date: 09/25/2020 CLINICAL DATA:  Left proximal femur fracture repair. EXAM: OPERATIVE LEFT HIP (WITH PELVIS IF PERFORMED) 7 VIEWS TECHNIQUE: Fluoroscopic spot image(s) were submitted for interpretation post-operatively. FLUOROSCOPY  TIME:  2 minutes and 52 seconds. COMPARISON:  September 24, 2020 FINDINGS: Screws were placed through the femoral neck and an intramedullary rod is placed through the femur with 2 distal interlocking screws. IMPRESSION: Left proximal femur fracture repair as above. Electronically Signed   By: Dorise Bullion III M.D   On: 09/25/2020 15:57   DG Femur Min 2 Views Left  Result Date: 09/24/2020 CLINICAL DATA:  Possible left femoral pathologic fracture. EXAM: LEFT FEMUR 2 VIEWS COMPARISON:  CT 08/24/2020 FINDINGS: Exam demonstrates a displaced intertrochanteric fracture of the left proximal femur through known lytic lesion compatible with pathologic fracture. Osteoarthritic change of the left knee. IMPRESSION: Displaced intertrochanteric fracture of the left proximal femur through known lytic lesion compatible with pathologic fracture. Electronically Signed   By: Marin Olp M.D.   On: 09/24/2020 15:15   DG FEMUR PORT MIN 2 VIEWS LEFT  Result Date: 09/25/2020 CLINICAL DATA:  Postop EXAM: LEFT FEMUR PORTABLE 2 VIEWS COMPARISON:  09/24/2020 FINDINGS: Interval intramedullary rod and screw fixation of the left femur for pathologic left lower trochanteric and subtrochanteric femoral fracture. Near anatomic alignment. Gas in the soft tissues consistent with recent surgery. IMPRESSION: Interval intramedullary rod and screw fixation of pathologic proximal left femoral fracture. Electronically Signed   By: Donavan Foil M.D.   On: 09/25/2020 15:44    Subjective: No new complaints.  Discharge Exam: Vitals:   10/04/20 0415 10/04/20 0802  BP:  132/71  Pulse:  93  Resp:  13  Temp: 98.2 F (36.8 C) 98.7 F (37.1 C)  SpO2:  99%   Vitals:   10/03/20 2350 10/04/20 0415 10/04/20 0500 10/04/20 0802  BP:    132/71  Pulse:    93  Resp:    13  Temp: 98.5 F (36.9 C) 98.2 F (36.8 C)  98.7 F (37.1 C)  TempSrc:    Oral  SpO2:    99%  Weight:   133.8 kg   Height:        General: Pt is alert, awake, not  in acute distress Cardiovascular: RRR, S1/S2 +, no rubs, no gallops Respiratory: CTA bilaterally, no wheezing, no rhonchi Abdominal: Soft, NT, ND, bowel sounds + Extremities: no edema, no cyanosis    The results of significant diagnostics from this hospitalization (including imaging, microbiology, ancillary and laboratory) are listed below for reference.     Microbiology: Recent Results (from the past 240 hour(s))  Resp Panel by RT-PCR (Flu A&B, Covid) Nasopharyngeal Swab     Status: None   Collection Time: 09/24/20  3:38 PM   Specimen: Nasopharyngeal Swab; Nasopharyngeal(NP) swabs in vial transport medium  Result Value Ref Range Status   SARS Coronavirus 2 by RT PCR NEGATIVE NEGATIVE Final    Comment: (NOTE) SARS-CoV-2 target nucleic acids  are NOT DETECTED.  The SARS-CoV-2 RNA is generally detectable in upper respiratory specimens during the acute phase of infection. The lowest concentration of SARS-CoV-2 viral copies this assay can detect is 138 copies/mL. A negative result does not preclude SARS-Cov-2 infection and should not be used as the sole basis for treatment or other patient management decisions. A negative result may occur with  improper specimen collection/handling, submission of specimen other than nasopharyngeal swab, presence of viral mutation(s) within the areas targeted by this assay, and inadequate number of viral copies(<138 copies/mL). A negative result must be combined with clinical observations, patient history, and epidemiological information. The expected result is Negative.  Fact Sheet for Patients:  EntrepreneurPulse.com.au  Fact Sheet for Healthcare Providers:  IncredibleEmployment.be  This test is no t yet approved or cleared by the [place] FDA and  has been authorized for detection and/or diagnosis of SARS-CoV-2 by FDA under an Emergency Use Authorization (EUA). This EUA will remain  in effect (meaning  this test can be used) for the duration of the COVID-19 declaration under Section 564(b)(1) of the Act, 21 U.S.C.section 360bbb-3(b)(1), unless the authorization is terminated  or revoked sooner.       Influenza A by PCR NEGATIVE NEGATIVE Final   Influenza B by PCR NEGATIVE NEGATIVE Final    Comment: (NOTE) The Xpert Xpress SARS-CoV-2/FLU/RSV plus assay is intended as an aid in the diagnosis of influenza from Nasopharyngeal swab specimens and should not be used as a sole basis for treatment. Nasal washings and aspirates are unacceptable for Xpert Xpress SARS-CoV-2/FLU/RSV testing.  Fact Sheet for Patients: EntrepreneurPulse.com.au  Fact Sheet for Healthcare Providers: IncredibleEmployment.be  This test is not yet approved or cleared by the [place] FDA and has been authorized for detection and/or diagnosis of SARS-CoV-2 by FDA under an Emergency Use Authorization (EUA). This EUA will remain in effect (meaning this test can be used) for the duration of the COVID-19 declaration under Section 564(b)(1) of the Act, 21 U.S.C. section 360bbb-3(b)(1), unless the authorization is terminated or revoked.  Performed at [facility], [address] [place], [place] [address]   MRSA PCR Screening     Status: None   Collection Time: 09/24/20 11:24 PM   Specimen: Nasopharyngeal  Result Value Ref Range Status   MRSA by PCR NEGATIVE NEGATIVE Final    Comment:        The GeneXpert MRSA Assay (FDA approved for NASAL specimens only), is one component of a comprehensive MRSA colonization surveillance program. It is not intended to diagnose MRSA infection nor to guide or monitor treatment for MRSA infections. Performed at [facility], [place] [address], [place], [place] [address]   Culture, blood (routine x 2)     Status: None   Collection Time: 09/27/20 11:59 AM   Specimen: BLOOD LEFT HAND  Result Value Ref Range Status    Specimen Description BLOOD LEFT HAND  Final   Special Requests   Final    BOTTLES DRAWN AEROBIC AND ANAEROBIC Blood Culture adequate volume   Culture   Final    NO GROWTH 5 DAYS Performed at [facility], [place] [address], [place], [place] [address]    Report Status 10/02/2020 FINAL  Final  Culture, blood (routine x 2)     Status: None   Collection Time: 09/27/20 12:00 PM   Specimen: BLOOD RIGHT HAND  Result Value Ref Range Status   Specimen Description BLOOD RIGHT HAND  Final   Special Requests  Final    BOTTLES DRAWN AEROBIC AND ANAEROBIC Blood Culture adequate volume   Culture   Final    NO GROWTH 5 DAYS Performed at [facility], [place] [address], [place], [place] [address]    Report Status 10/02/2020 FINAL  Final  Resp Panel by RT-PCR (Flu A&B, Covid) Nasopharyngeal Swab     Status: None   Collection Time: 10/03/20  9:30 AM   Specimen: Nasopharyngeal Swab; Nasopharyngeal(NP) swabs in vial transport medium  Result Value Ref Range Status   SARS Coronavirus 2 by RT PCR NEGATIVE NEGATIVE Final    Comment: (NOTE) SARS-CoV-2 target nucleic acids are NOT DETECTED.  The SARS-CoV-2 RNA is generally detectable in upper respiratory specimens during the acute phase of infection. The lowest concentration of SARS-CoV-2 viral copies this assay can detect is 138 copies/mL. A negative result does not preclude SARS-Cov-2 infection and should not be used as the sole basis for treatment or other patient management decisions. A negative result may occur with  improper specimen collection/handling, submission of specimen other than nasopharyngeal swab, presence of viral mutation(s) within the areas targeted by this assay, and inadequate number of viral copies(<138 copies/mL). A negative result must be combined with clinical observations, patient history, and epidemiological information. The expected result is Negative.  Fact Sheet for Patients:   EntrepreneurPulse.com.au  Fact Sheet for Healthcare Providers:  IncredibleEmployment.be  This test is no t yet approved or cleared by the [place] FDA and  has been authorized for detection and/or diagnosis of SARS-CoV-2 by FDA under an Emergency Use Authorization (EUA). This EUA will remain  in effect (meaning this test can be used) for the duration of the COVID-19 declaration under Section 564(b)(1) of the Act, 21 U.S.C.section 360bbb-3(b)(1), unless the authorization is terminated  or revoked sooner.       Influenza A by PCR NEGATIVE NEGATIVE Final   Influenza B by PCR NEGATIVE NEGATIVE Final    Comment: (NOTE) The Xpert Xpress SARS-CoV-2/FLU/RSV plus assay is intended as an aid in the diagnosis of influenza from Nasopharyngeal swab specimens and should not be used as a sole basis for treatment. Nasal washings and aspirates are unacceptable for Xpert Xpress SARS-CoV-2/FLU/RSV testing.  Fact Sheet for Patients: EntrepreneurPulse.com.au  Fact Sheet for Healthcare Providers: IncredibleEmployment.be  This test is not yet approved or cleared by the [place] FDA and has been authorized for detection and/or diagnosis of SARS-CoV-2 by FDA under an Emergency Use Authorization (EUA). This EUA will remain in effect (meaning this test can be used) for the duration of the COVID-19 declaration under Section 564(b)(1) of the Act, 21 U.S.C. section 360bbb-3(b)(1), unless the authorization is terminated or revoked.  Performed at [facility], [place] [address], [place], [place] [address]      Labs: BNP (last 3 results) No results for input(s): BNP in the last 8760 hours. Basic Metabolic Panel: Recent Labs  Lab 09/28/20 0456  NA 138  K 4.0  CL 101  CO2 28  GLUCOSE 121*  BUN 15  CREATININE 0.92  CALCIUM 9.0   Liver Function Tests: No results for input(s): AST, ALT, ALKPHOS, BILITOT, PROT,  ALBUMIN in the last 168 hours. No results for input(s): LIPASE, AMYLASE in the last 168 hours. No results for input(s): AMMONIA in the last 168 hours. CBC: Recent Labs  Lab 09/28/20 0456  WBC 5.8  HGB 9.8*  HCT 32.6*  MCV 84.0  PLT 154   Cardiac Enzymes: No results for input(s): CKTOTAL, CKMB,  CKMBINDEX, TROPONINI in the last 168 hours. BNP: Invalid input(s): POCBNP CBG: Recent Labs  Lab 10/03/20 1313 10/03/20 1645 10/03/20 2049 10/03/20 2351 10/04/20 0417  GLUCAP 182* 152* 153* 131* 123*   D-Dimer No results for input(s): DDIMER in the last 72 hours. Hgb A1c No results for input(s): HGBA1C in the last 72 hours. Lipid Profile No results for input(s): CHOL, HDL, LDLCALC, TRIG, CHOLHDL, LDLDIRECT in the last 72 hours. Thyroid function studies No results for input(s): TSH, T4TOTAL, T3FREE, THYROIDAB in the last 72 hours.  Invalid input(s): FREET3 Anemia work up No results for input(s): VITAMINB12, FOLATE, FERRITIN, TIBC, IRON, RETICCTPCT in the last 72 hours. Urinalysis No results found for: COLORURINE, APPEARANCEUR, LABSPEC, [place], GLUCOSEU, [place], [place], KETONESUR, PROTEINUR, UROBILINOGEN, NITRITE, LEUKOCYTESUR Sepsis Labs Invalid input(s): PROCALCITONIN,  WBC,  LACTICIDVEN Microbiology Recent Results (from the past 240 hour(s))  Resp Panel by RT-PCR (Flu A&B, Covid) Nasopharyngeal Swab     Status: None   Collection Time: 09/24/20  3:38 PM   Specimen: Nasopharyngeal Swab; Nasopharyngeal(NP) swabs in vial transport medium  Result Value Ref Range Status   SARS Coronavirus 2 by RT PCR NEGATIVE NEGATIVE Final    Comment: (NOTE) SARS-CoV-2 target nucleic acids are NOT DETECTED.  The SARS-CoV-2 RNA is generally detectable in upper respiratory specimens during the acute phase of infection. The lowest concentration of SARS-CoV-2 viral copies this assay can detect is 138 copies/mL. A negative result does not preclude SARS-Cov-2 infection and should not be used  as the sole basis for treatment or other patient management decisions. A negative result may occur with  improper specimen collection/handling, submission of specimen other than nasopharyngeal swab, presence of viral mutation(s) within the areas targeted by this assay, and inadequate number of viral copies(<138 copies/mL). A negative result must be combined with clinical observations, patient history, and epidemiological information. The expected result is Negative.  Fact Sheet for Patients:  EntrepreneurPulse.com.au  Fact Sheet for Healthcare Providers:  IncredibleEmployment.be  This test is no t yet approved or cleared by the [place] FDA and  has been authorized for detection and/or diagnosis of SARS-CoV-2 by FDA under an Emergency Use Authorization (EUA). This EUA will remain  in effect (meaning this test can be used) for the duration of the COVID-19 declaration under Section 564(b)(1) of the Act, 21 U.S.C.section 360bbb-3(b)(1), unless the authorization is terminated  or revoked sooner.       Influenza A by PCR NEGATIVE NEGATIVE Final   Influenza B by PCR NEGATIVE NEGATIVE Final    Comment: (NOTE) The Xpert Xpress SARS-CoV-2/FLU/RSV plus assay is intended as an aid in the diagnosis of influenza from Nasopharyngeal swab specimens and should not be used as a sole basis for treatment. Nasal washings and aspirates are unacceptable for Xpert Xpress SARS-CoV-2/FLU/RSV testing.  Fact Sheet for Patients: EntrepreneurPulse.com.au  Fact Sheet for Healthcare Providers: IncredibleEmployment.be  This test is not yet approved or cleared by the [place] FDA and has been authorized for detection and/or diagnosis of SARS-CoV-2 by FDA under an Emergency Use Authorization (EUA). This EUA will remain in effect (meaning this test can be used) for the duration of the COVID-19 declaration under Section 564(b)(1)  of the Act, 21 U.S.C. section 360bbb-3(b)(1), unless the authorization is terminated or revoked.  Performed at [facility], [address], [place], [place] [address]   MRSA PCR Screening     Status: None   Collection Time: 09/24/20 11:24 PM   Specimen: Nasopharyngeal  Result Value Ref Range Status  MRSA by PCR NEGATIVE NEGATIVE Final    Comment:        The GeneXpert MRSA Assay (FDA approved for NASAL specimens only), is one component of a comprehensive MRSA colonization surveillance program. It is not intended to diagnose MRSA infection nor to guide or monitor treatment for MRSA infections. Performed at [facility], [place] [address], [place], [place] [address]   Culture, blood (routine x 2)     Status: None   Collection Time: 09/27/20 11:59 AM   Specimen: BLOOD LEFT HAND  Result Value Ref Range Status   Specimen Description BLOOD LEFT HAND  Final   Special Requests   Final    BOTTLES DRAWN AEROBIC AND ANAEROBIC Blood Culture adequate volume   Culture   Final    NO GROWTH 5 DAYS Performed at [facility], [place] [address], [place], [place] [address]    Report Status 10/02/2020 FINAL  Final  Culture, blood (routine x 2)     Status: None   Collection Time: 09/27/20 12:00 PM   Specimen: BLOOD RIGHT HAND  Result Value Ref Range Status   Specimen Description BLOOD RIGHT HAND  Final   Special Requests   Final    BOTTLES DRAWN AEROBIC AND ANAEROBIC Blood Culture adequate volume   Culture   Final    NO GROWTH 5 DAYS Performed at [facility], [place] [address] [place], [place] [address]    Report Status 10/02/2020 FINAL  Final  Resp Panel by RT-PCR (Flu A&B, Covid) Nasopharyngeal Swab     Status: None   Collection Time: 10/03/20  9:30 AM   Specimen: Nasopharyngeal Swab; Nasopharyngeal(NP) swabs in vial transport medium  Result Value Ref Range Status   SARS Coronavirus 2 by RT PCR NEGATIVE NEGATIVE Final    Comment: (NOTE) SARS-CoV-2 target  nucleic acids are NOT DETECTED.  The SARS-CoV-2 RNA is generally detectable in upper respiratory specimens during the acute phase of infection. The lowest concentration of SARS-CoV-2 viral copies this assay can detect is 138 copies/mL. A negative result does not preclude SARS-Cov-2 infection and should not be used as the sole basis for treatment or other patient management decisions. A negative result may occur with  improper specimen collection/handling, submission of specimen other than nasopharyngeal swab, presence of viral mutation(s) within the areas targeted by this assay, and inadequate number of viral copies(<138 copies/mL). A negative result must be combined with clinical observations, patient history, and epidemiological information. The expected result is Negative.  Fact Sheet for Patients:  EntrepreneurPulse.com.au  Fact Sheet for Healthcare Providers:  IncredibleEmployment.be  This test is no t yet approved or cleared by the [place] FDA and  has been authorized for detection and/or diagnosis of SARS-CoV-2 by FDA under an Emergency Use Authorization (EUA). This EUA will remain  in effect (meaning this test can be used) for the duration of the COVID-19 declaration under Section 564(b)(1) of the Act, 21 U.S.C.section 360bbb-3(b)(1), unless the authorization is terminated  or revoked sooner.       Influenza A by PCR NEGATIVE NEGATIVE Final   Influenza B by PCR NEGATIVE NEGATIVE Final    Comment: (NOTE) The Xpert Xpress SARS-CoV-2/FLU/RSV plus assay is intended as an aid in the diagnosis of influenza from Nasopharyngeal swab specimens and should not be used as a sole basis for treatment. Nasal washings and aspirates are unacceptable for Xpert Xpress SARS-CoV-2/FLU/RSV testing.  Fact Sheet for Patients: EntrepreneurPulse.com.au  Fact Sheet for Healthcare  Providers: IncredibleEmployment.be  This test is not yet approved or cleared by the [place] and has been authorized for detection and/or diagnosis of SARS-CoV-2 by FDA under an Emergency Use Authorization (EUA). This EUA will remain in effect (meaning this test can be used) for the duration of the COVID-19 declaration under Section 564(b)(1) of the Act, 21 U.S.C. section 360bbb-3(b)(1), unless the authorization is terminated or revoked.  Performed at [facility], [place] [address], [place], [place] [address]      Time coordinating discharge: Over 40 minutes  SIGNED:   Charlynne Cousins, MD  [facility] 10/04/2020, 8:18 AM Pager   If 7PM-7AM, please contact night-coverage [email or web address] Password [email or web address]

## 2020-10-05 LAB — GLUCOSE, CAPILLARY
Glucose-Capillary: 99 mg/dL (ref 70–99)
Glucose-Capillary: 129 mg/dL — ABNORMAL HIGH (ref 70–99)

## 2020-10-05 NOTE — TOC Transition Note (Addendum)
Transition of Care [facility]) - CM/SW Discharge Note   Patient Details  Name: Andrew Dominguez MRN: [id number] Date of Birth: 1956-03-18  [facility] of [facility] CM/SW Contact:  Vinie Sill, [place] Phone Number: 10/05/2020, 10:24 AM   Clinical Narrative:     Patient will DC to: [place]  DC Date: 10/05/2020 Family Notified: Amber,daughter Transport By: Corey Harold  Per MD patient is ready for discharge. RN, patient, and facility notified of DC. Discharge Summary sent to facility. RN given number for [phone], room 110. Ambulance transport requested for patient  Thurmond Butts, MSW, [place] Social Worker   Final next level of care: Skilled Nursing Facility Barriers to Discharge: Barriers Resolved   Patient Goals and CMS Choice        Discharge Placement                       Discharge Plan and Services In-house Referral: Clinical Social Work                                   Social Determinants of Health (SDOH) Interventions     Readmission Risk Interventions No flowsheet data found.

## 2020-10-05 NOTE — Progress Notes (Signed)
Andrew Dominguez continue to be medically stable for discharge to SNF today. His pain is well controlled, no nausea or vomiting. No dyspnea or chest pain.  VS stable with BP 107/71, HR 81, RR 17 and oxygenation 100% on room air.   Physical examination not changed.

## 2020-10-12 ENCOUNTER — Telehealth: Payer: Self-pay

## 2020-10-12 NOTE — Telephone Encounter (Signed)
-----   Message from Secundino Ginger sent at 10/12/2020 12:06 PM EST ----- Regarding: FW: Please schedule follow-up appt  ----- Message ----- From: Lequita Asal, MD Sent: 10/03/2020   5:13 PM EST To: Secundino Ginger Subject: RE: Please schedule follow-up appt              Thanks.  M ----- Message ----- From: Secundino Ginger Sent: 10/03/2020   2:12 PM EST To: Lequita Asal, MD Subject: RE: Please schedule follow-up appt             I called the patient and he is in [place] now and will be discharged to a rehab center. He will call when he is settled.  ----- Message ----- From: Lequita Asal, MD Sent: 10/03/2020   1:01 PM EST To: Secundino Ginger Subject: Please schedule follow-up appt                  Patient discharged to a skilled nursing facility from [facility] today.  He was admitted with a pathologic femur fracture.  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  We need to have him back in clinic quickly.    He needs additional tissue and treatment as soon as possible.  M

## 2020-10-27 ENCOUNTER — Telehealth: Payer: Self-pay | Admitting: Hematology and Oncology

## 2020-10-27 NOTE — Telephone Encounter (Signed)
I called Andrew Dominguez to follow up on making a follow up appt with Dr Mike Gip. He is in rehab and said he would call when he was out and settled, I left a VM for him to call me back.

## 2020-11-09 ENCOUNTER — Telehealth: Payer: Self-pay | Admitting: Hematology and Oncology

## 2020-11-09 NOTE — Telephone Encounter (Signed)
I called patient to check his status and see if he was still in rehab. Dr Mike Gip would like to see patient for follow up. I left a message for patient on [phone]. I asked him to give me a call to see if there is anyway he can come for a follow up.

## 2020-11-10 ENCOUNTER — Telehealth: Payer: Self-pay | Admitting: *Deleted

## 2020-11-10 ENCOUNTER — Telehealth: Payer: Self-pay

## 2020-11-10 NOTE — Telephone Encounter (Signed)
  Please call patient.  He has to complete staging and obtain a biopsy for testing for double/triple hit lymphoma to determine his treatment plan.  Can he come in next week to discuss in detail?  M

## 2020-11-10 NOTE — Telephone Encounter (Signed)
Patient wife states he is worried about his cancer diagnosis. He fell and broke his hip since he was last seen in office. Patient wife states patient is in a rehab facility in [place] and wants to be closer. Patient wife states that during the surgrey the hospital stated they did a Biopsy but will not release any information regarding biopsy to the family. Patient is currently doing rehab at [facility] in [place]. Patient wife wants to know what's going on as far as treatment.

## 2020-11-10 NOTE — Telephone Encounter (Signed)
Leafy Ro called asking to speak with Dr Mike Gip regarding the patient treatment plan. [phone]

## 2020-11-16 ENCOUNTER — Ambulatory Visit (INDEPENDENT_AMBULATORY_CARE_PROVIDER_SITE_OTHER): Payer: 59

## 2020-11-16 ENCOUNTER — Telehealth: Payer: Self-pay

## 2020-11-16 ENCOUNTER — Other Ambulatory Visit: Payer: Self-pay

## 2020-11-16 ENCOUNTER — Encounter: Payer: Self-pay | Admitting: Orthopedic Surgery

## 2020-11-16 ENCOUNTER — Ambulatory Visit (INDEPENDENT_AMBULATORY_CARE_PROVIDER_SITE_OTHER): Payer: 59 | Admitting: Orthopedic Surgery

## 2020-11-16 DIAGNOSIS — G8918 Other acute postprocedural pain: Secondary | ICD-10-CM

## 2020-11-16 NOTE — Telephone Encounter (Signed)
Left message for call back.

## 2020-11-16 NOTE — Progress Notes (Signed)
Post-Op Visit Note   Patient: Andrew Dominguez           Date of Birth: 11-14-1955           MRN: [id number] Visit Date: 11/16/2020 PCP: Baxter Hire, MD   Assessment & Plan:  Chief Complaint:  Chief Complaint  Patient presents with  . Left Hip - Routine Post Op   Visit Diagnoses:  1. Post-op pain     Plan: [name] is a 65 year old patient underwent left hip intramedullary nail for pathologic substrate femur fracture.  This is his first postop visit.  He was scheduled for postop visits earlier but did not make them.  He has not been weightbearing on that left leg.  Seeing a oncologist in the near future.  Pathology from the reamings was likely B-cell lymphoma.  On examination incisions look reasonably well-healed.  No evidence of infection.  Not much pain with hip range of motion passively with circumduction.  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  Plan at this time is no weightbearing until we see some callus formation around the fracture site.  He will need repeat radiographs in about 2 months just to recheck.  I think for transfers he is okay touchdown weightbearing only but really he should not walk on that femur because the hardware will fail.  Follow-up in 8 weeks here or with Dr. Rudene Christians in [place].  I did give him a copy of the pathology report from the intramedullary nailing of his femur.  Follow-Up Instructions: Return in about 8 weeks (around 01/11/2021).   Orders:  Orders Placed This Encounter  Procedures  . XR FEMUR MIN 2 VIEWS LEFT   No orders of the defined types were placed in this encounter.   Imaging: XR FEMUR MIN 2 VIEWS LEFT  Result Date: 11/16/2020 AP lateral left femur reviewed.  Intramedullary nail transfixes subtrochanteric femur fracture.  Not much callus formation present in the upper subtrochanteric region.  No lucencies or loosening of the hardware.  No screw breakage.   PMFS History: Patient Active Problem  List   Diagnosis Date Noted  . Atrial fibrillation with RVR ([place]) 09/27/2020  . Fever 09/27/2020  . Hip fracture ([place]) 09/24/2020  . S/p left hip fracture 09/24/2020  . Liver metastases ([place]) 09/12/2020  . Lung nodules 09/12/2020  . Lytic bone lesions on xray 08/23/2020  . Type 2 diabetes mellitus ([place]) 01/01/2017  . Obesity 01/01/2017  . Hypertriglyceridemia 01/01/2017  . Hypertension 01/01/2017  . Hyperlipidemia, unspecified 01/01/2017  . Hyperlipidemia 01/01/2017  . Diabetes mellitus ([place]) 01/01/2017  . Vitamin D deficiency 03/01/2015  . Bilateral leg edema 08/26/2014   Past Medical History:  Diagnosis Date  . Anemia   . Arthritis    knees  . COPD (chronic obstructive pulmonary disease) ([place])   . Diabetes mellitus without complication ([place])   . Hyperlipemia   . Hypertension   . Hypertriglyceridemia   . Hypogonadism in male   . Numbness of foot    bilateral  . Obesity   . Tubular adenoma of colon   . Vitamin D deficiency     Family History  Problem Relation Age of Onset  . Lung cancer Father   . Prostate cancer Neg Hx   . Bladder Cancer Neg Hx   . Kidney cancer Neg Hx     Past Surgical History:  Procedure Laterality Date  . CATARACT EXTRACTION W/PHACO Right 04/07/2019   Procedure: CATARACT EXTRACTION  PHACO AND INTRAOCULAR LENS PLACEMENT (IOC)  RIGHT DIABETIC;  Surgeon: Birder Robson, MD;  Location: [place];  Service: Ophthalmology;  Laterality: Right;  Diabetic - oral meds  . COLONOSCOPY    . COLONOSCOPY WITH PROPOFOL N/A 05/16/2015   Procedure: COLONOSCOPY WITH PROPOFOL;  Surgeon: Manya Silvas, MD;  Location: [facility] ENDOSCOPY;  Service: Endoscopy;  Laterality: N/A;  . FEMUR IM NAIL Left 09/25/2020   Procedure: INTRAMEDULLARY (IM) NAIL HIP SCREW;  Surgeon: Meredith Pel, MD;  Location: [facility] [place];  Service: Orthopedics;  Laterality: Left;  . ORIF FINGER / THUMB FRACTURE Left    thumb, 1970s   Social History   Occupational History  . Not on  file  Tobacco Use  . Smoking status: Former Smoker    Quit date: 2000    Years since quitting: 22.0  . Smokeless tobacco: Never Used  Vaping Use  . Vaping Use: Never used  Substance and Sexual Activity  . Alcohol use: Yes    Alcohol/week: 14.0 standard drinks    Types: 14 Shots of liquor per week  . Drug use: No  . Sexual activity: Not on file

## 2020-11-16 NOTE — Telephone Encounter (Signed)
-----   Message from Secundino Ginger sent at 11/16/2020 10:32 AM EST ----- Regarding: RE: Please schedule follow-up appt I got a hold of wife Leafy Ro [phone]. She said Andrew Dominguez was really getting depressed. He wants to get out of [place] and back to [facility]. She also said that he don't want to see anyone but Dr Mike Gip. She said to her knowledge the facility he is in is working to get him back to [employer]. I will call transportation and talk to them about this patient and call his care worrker Jolayne Haines at [place].   ----- Message ----- From: Lequita Asal, MD Sent: 10/27/2020   2:20 PM EST To: Secundino Ginger Subject: RE: Please schedule follow-up appt              He can still come.  He just needs transportation provided by [place] or them.  M  ----- Message ----- From: Secundino Ginger Sent: 10/27/2020   2:02 PM EST To: Lequita Asal, MD Subject: RE: Please schedule follow-up appt             He is still in Rehab and said he would be in there a while.  ----- Message ----- From: Lequita Asal, MD Sent: 10/03/2020   5:13 PM EST To: Secundino Ginger Subject: RE: Please schedule follow-up appt              Thanks.  M ----- Message ----- From: Secundino Ginger Sent: 10/03/2020   2:12 PM EST To: Lequita Asal, MD Subject: RE: Please schedule follow-up appt             I called the patient and he is in [place] now and will be discharged to a rehab center. He will call when he is settled.  ----- Message ----- From: Lequita Asal, MD Sent: 10/03/2020   1:01 PM EST To: Secundino Ginger Subject: Please schedule follow-up appt                  Patient discharged to a skilled nursing facility from [facility] today.  He was admitted with a pathologic femur fracture.  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  We need to have him back in clinic quickly.    He needs additional tissue and treatment as  soon as possible.  M

## 2020-11-23 ENCOUNTER — Telehealth: Payer: Self-pay

## 2020-11-23 NOTE — Telephone Encounter (Signed)
I spoke with the patients wife to see if there were any updates with patient. She states they are still trying to get him moved to [place]. She is meeting with a case worker today. She states that he is having trouble with his vision and they are trying to get that checked out as well. Advised her to let us know if he'd be willing to get treatment at [facility] just in the mean time until he can get moved back here for treatment. Advised her that it is his best interest to get treatment started sooner rather than later. Also told her that we are still working on getting transportation set up.

## 2020-11-23 NOTE — Telephone Encounter (Signed)
-----   Message from Aubery Lapping sent at 11/23/2020  9:56 AM EST ----- Regarding: FW: Please schedule follow-up appt  ----- Message ----- From: Secundino Ginger Sent: 11/21/2020  11:30 AM EST To: Aubery Lapping Subject: FW: Please schedule follow-up appt              ----- Message ----- From: Lequita Asal, MD Sent: 11/16/2020   3:13 PM EST To: Secundino Ginger, Jorene Minors, RN, # Subject: RE: Please schedule follow-up appt              Please help to get him here quickly.  M  ----- Message ----- From: Secundino Ginger Sent: 11/16/2020  10:36 AM EST To: Lequita Asal, MD, Drue Dun, RN Subject: RE: Please schedule follow-up appt             I got a hold of wife Leafy Ro [phone]. She said Winfrey was really getting depressed. He wants to get out of [place] and back to [facility]. She also said that he don't want to see anyone but Dr Mike Gip. She said to her knowledge the facility he is in is working to get him back to [employer]. I will call transportation and talk to them about this patient and call his care worrker Jolayne Haines at [place].   ----- Message ----- From: Lequita Asal, MD Sent: 10/27/2020   2:20 PM EST To: Secundino Ginger Subject: RE: Please schedule follow-up appt              He can still come.  He just needs transportation provided by [place] or them.  M  ----- Message ----- From: Secundino Ginger Sent: 10/27/2020   2:02 PM EST To: Lequita Asal, MD Subject: RE: Please schedule follow-up appt             He is still in Rehab and said he would be in there a while.  ----- Message ----- From: Lequita Asal, MD Sent: 10/03/2020   5:13 PM EST To: Secundino Ginger Subject: RE: Please schedule follow-up appt              Thanks.  M ----- Message ----- From: Secundino Ginger Sent: 10/03/2020   2:12 PM EST To: Lequita Asal, MD Subject: RE: Please schedule follow-up appt              I called the patient and he is in [place] now and will be discharged to a rehab center. He will call when he is settled.  ----- Message ----- From: Lequita Asal, MD Sent: 10/03/2020   1:01 PM EST To: Secundino Ginger Subject: Please schedule follow-up appt                  Patient discharged to a skilled nursing facility from [facility] today.  He was admitted with a pathologic femur fracture.  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  We need to have him back in clinic quickly.    He needs additional tissue and treatment as soon as possible.  M

## 2020-11-24 NOTE — Telephone Encounter (Signed)
I called and left message with social worker yest. And she has not called me back  so far

## 2020-12-01 ENCOUNTER — Encounter (HOSPITAL_COMMUNITY): Payer: Self-pay | Admitting: Emergency Medicine

## 2020-12-01 ENCOUNTER — Inpatient Hospital Stay (HOSPITAL_COMMUNITY)
Admission: EM | Admit: 2020-12-01 | Discharge: 2021-01-03 | DRG: 871 | Disposition: E | Payer: 59 | Attending: Critical Care Medicine | Admitting: Critical Care Medicine

## 2020-12-01 DIAGNOSIS — R6521 Severe sepsis with septic shock: Secondary | ICD-10-CM | POA: Diagnosis not present

## 2020-12-01 DIAGNOSIS — I4891 Unspecified atrial fibrillation: Secondary | ICD-10-CM | POA: Diagnosis present

## 2020-12-01 DIAGNOSIS — G9341 Metabolic encephalopathy: Secondary | ICD-10-CM | POA: Diagnosis present

## 2020-12-01 DIAGNOSIS — E872 Acidosis: Secondary | ICD-10-CM | POA: Diagnosis present

## 2020-12-01 DIAGNOSIS — Z7901 Long term (current) use of anticoagulants: Secondary | ICD-10-CM

## 2020-12-01 DIAGNOSIS — C8518 Unspecified B-cell lymphoma, lymph nodes of multiple sites: Secondary | ICD-10-CM | POA: Diagnosis present

## 2020-12-01 DIAGNOSIS — Z801 Family history of malignant neoplasm of trachea, bronchus and lung: Secondary | ICD-10-CM

## 2020-12-01 DIAGNOSIS — Z79899 Other long term (current) drug therapy: Secondary | ICD-10-CM

## 2020-12-01 DIAGNOSIS — J90 Pleural effusion, not elsewhere classified: Secondary | ICD-10-CM | POA: Diagnosis present

## 2020-12-01 DIAGNOSIS — Z7189 Other specified counseling: Secondary | ICD-10-CM | POA: Diagnosis not present

## 2020-12-01 DIAGNOSIS — L03116 Cellulitis of left lower limb: Secondary | ICD-10-CM | POA: Diagnosis present

## 2020-12-01 DIAGNOSIS — E1165 Type 2 diabetes mellitus with hyperglycemia: Secondary | ICD-10-CM | POA: Diagnosis present

## 2020-12-01 DIAGNOSIS — E785 Hyperlipidemia, unspecified: Secondary | ICD-10-CM | POA: Diagnosis present

## 2020-12-01 DIAGNOSIS — C8338 Diffuse large B-cell lymphoma, lymph nodes of multiple sites: Secondary | ICD-10-CM

## 2020-12-01 DIAGNOSIS — R531 Weakness: Secondary | ICD-10-CM

## 2020-12-01 DIAGNOSIS — R161 Splenomegaly, not elsewhere classified: Secondary | ICD-10-CM | POA: Diagnosis present

## 2020-12-01 DIAGNOSIS — Z0189 Encounter for other specified special examinations: Secondary | ICD-10-CM | POA: Diagnosis not present

## 2020-12-01 DIAGNOSIS — Z9911 Dependence on respirator [ventilator] status: Secondary | ICD-10-CM

## 2020-12-01 DIAGNOSIS — E790 Hyperuricemia without signs of inflammatory arthritis and tophaceous disease: Secondary | ICD-10-CM | POA: Diagnosis present

## 2020-12-01 DIAGNOSIS — C787 Secondary malignant neoplasm of liver and intrahepatic bile duct: Secondary | ICD-10-CM | POA: Diagnosis present

## 2020-12-01 DIAGNOSIS — Z882 Allergy status to sulfonamides status: Secondary | ICD-10-CM

## 2020-12-01 DIAGNOSIS — I10 Essential (primary) hypertension: Secondary | ICD-10-CM | POA: Diagnosis present

## 2020-12-01 DIAGNOSIS — E559 Vitamin D deficiency, unspecified: Secondary | ICD-10-CM | POA: Diagnosis present

## 2020-12-01 DIAGNOSIS — Z6839 Body mass index (BMI) 39.0-39.9, adult: Secondary | ICD-10-CM

## 2020-12-01 DIAGNOSIS — D72810 Lymphocytopenia: Secondary | ICD-10-CM | POA: Diagnosis present

## 2020-12-01 DIAGNOSIS — I482 Chronic atrial fibrillation, unspecified: Secondary | ICD-10-CM | POA: Diagnosis present

## 2020-12-01 DIAGNOSIS — Z794 Long term (current) use of insulin: Secondary | ICD-10-CM

## 2020-12-01 DIAGNOSIS — Z20822 Contact with and (suspected) exposure to covid-19: Secondary | ICD-10-CM | POA: Diagnosis present

## 2020-12-01 DIAGNOSIS — E878 Other disorders of electrolyte and fluid balance, not elsewhere classified: Secondary | ICD-10-CM | POA: Diagnosis present

## 2020-12-01 DIAGNOSIS — C3490 Malignant neoplasm of unspecified part of unspecified bronchus or lung: Secondary | ICD-10-CM | POA: Diagnosis present

## 2020-12-01 DIAGNOSIS — A4159 Other Gram-negative sepsis: Secondary | ICD-10-CM | POA: Diagnosis present

## 2020-12-01 DIAGNOSIS — J80 Acute respiratory distress syndrome: Secondary | ICD-10-CM | POA: Diagnosis present

## 2020-12-01 DIAGNOSIS — R0603 Acute respiratory distress: Secondary | ICD-10-CM

## 2020-12-01 DIAGNOSIS — M1990 Unspecified osteoarthritis, unspecified site: Secondary | ICD-10-CM | POA: Diagnosis present

## 2020-12-01 DIAGNOSIS — N179 Acute kidney failure, unspecified: Secondary | ICD-10-CM | POA: Diagnosis present

## 2020-12-01 DIAGNOSIS — Z01818 Encounter for other preprocedural examination: Secondary | ICD-10-CM

## 2020-12-01 DIAGNOSIS — C801 Malignant (primary) neoplasm, unspecified: Secondary | ICD-10-CM | POA: Diagnosis present

## 2020-12-01 DIAGNOSIS — E883 Tumor lysis syndrome: Secondary | ICD-10-CM | POA: Diagnosis present

## 2020-12-01 DIAGNOSIS — Z978 Presence of other specified devices: Secondary | ICD-10-CM

## 2020-12-01 DIAGNOSIS — E781 Pure hyperglyceridemia: Secondary | ICD-10-CM | POA: Diagnosis present

## 2020-12-01 DIAGNOSIS — D638 Anemia in other chronic diseases classified elsewhere: Secondary | ICD-10-CM | POA: Diagnosis present

## 2020-12-01 DIAGNOSIS — E119 Type 2 diabetes mellitus without complications: Secondary | ICD-10-CM

## 2020-12-01 DIAGNOSIS — J449 Chronic obstructive pulmonary disease, unspecified: Secondary | ICD-10-CM | POA: Diagnosis present

## 2020-12-01 DIAGNOSIS — Z7982 Long term (current) use of aspirin: Secondary | ICD-10-CM

## 2020-12-01 DIAGNOSIS — E875 Hyperkalemia: Secondary | ICD-10-CM | POA: Diagnosis present

## 2020-12-01 DIAGNOSIS — E669 Obesity, unspecified: Secondary | ICD-10-CM | POA: Diagnosis present

## 2020-12-01 DIAGNOSIS — Z713 Dietary counseling and surveillance: Secondary | ICD-10-CM

## 2020-12-01 DIAGNOSIS — Z6838 Body mass index (BMI) 38.0-38.9, adult: Secondary | ICD-10-CM

## 2020-12-01 DIAGNOSIS — C8330 Diffuse large B-cell lymphoma, unspecified site: Secondary | ICD-10-CM

## 2020-12-01 DIAGNOSIS — R530 Neoplastic (malignant) related fatigue: Secondary | ICD-10-CM

## 2020-12-01 DIAGNOSIS — Z87891 Personal history of nicotine dependence: Secondary | ICD-10-CM

## 2020-12-01 DIAGNOSIS — C7800 Secondary malignant neoplasm of unspecified lung: Secondary | ICD-10-CM | POA: Diagnosis present

## 2020-12-01 DIAGNOSIS — C7951 Secondary malignant neoplasm of bone: Secondary | ICD-10-CM | POA: Diagnosis present

## 2020-12-01 DIAGNOSIS — E876 Hypokalemia: Secondary | ICD-10-CM

## 2020-12-01 DIAGNOSIS — R160 Hepatomegaly, not elsewhere classified: Secondary | ICD-10-CM | POA: Diagnosis present

## 2020-12-01 DIAGNOSIS — H532 Diplopia: Secondary | ICD-10-CM | POA: Diagnosis present

## 2020-12-01 DIAGNOSIS — Z8781 Personal history of (healed) traumatic fracture: Secondary | ICD-10-CM

## 2020-12-01 DIAGNOSIS — J9601 Acute respiratory failure with hypoxia: Secondary | ICD-10-CM | POA: Diagnosis not present

## 2020-12-01 DIAGNOSIS — Z95828 Presence of other vascular implants and grafts: Secondary | ICD-10-CM

## 2020-12-01 DIAGNOSIS — Z515 Encounter for palliative care: Secondary | ICD-10-CM | POA: Diagnosis not present

## 2020-12-01 DIAGNOSIS — J9811 Atelectasis: Secondary | ICD-10-CM | POA: Diagnosis present

## 2020-12-01 DIAGNOSIS — E1142 Type 2 diabetes mellitus with diabetic polyneuropathy: Secondary | ICD-10-CM | POA: Diagnosis present

## 2020-12-01 DIAGNOSIS — E860 Dehydration: Secondary | ICD-10-CM

## 2020-12-01 DIAGNOSIS — Z7984 Long term (current) use of oral hypoglycemic drugs: Secondary | ICD-10-CM

## 2020-12-01 DIAGNOSIS — R590 Localized enlarged lymph nodes: Secondary | ICD-10-CM | POA: Diagnosis present

## 2020-12-01 DIAGNOSIS — N2889 Other specified disorders of kidney and ureter: Secondary | ICD-10-CM | POA: Diagnosis present

## 2020-12-01 DIAGNOSIS — R600 Localized edema: Secondary | ICD-10-CM | POA: Diagnosis present

## 2020-12-01 DIAGNOSIS — A419 Sepsis, unspecified organism: Secondary | ICD-10-CM | POA: Diagnosis not present

## 2020-12-01 LAB — COMPREHENSIVE METABOLIC PANEL
ALT: 14 U/L (ref 0–44)
AST: 17 U/L (ref 15–41)
Albumin: 2.9 g/dL — ABNORMAL LOW (ref 3.5–5.0)
Alkaline Phosphatase: 72 U/L (ref 38–126)
Anion gap: 13 (ref 5–15)
BUN: 59 mg/dL — ABNORMAL HIGH (ref 8–23)
CO2: 22 mmol/L (ref 22–32)
Calcium: 14.7 mg/dL (ref 8.9–10.3)
Chloride: 104 mmol/L (ref 98–111)
Creatinine, Ser: 1.91 mg/dL — ABNORMAL HIGH (ref 0.61–1.24)
GFR, Estimated: 39 mL/min — ABNORMAL LOW (ref >60)
Glucose, Bld: 93 mg/dL (ref 70–99)
Potassium: 4.5 mmol/L (ref 3.5–5.1)
Sodium: 139 mmol/L (ref 135–145)
Total Bilirubin: 0.9 mg/dL (ref 0.3–1.2)
Total Protein: 5.7 g/dL — ABNORMAL LOW (ref 6.5–8.1)

## 2020-12-01 LAB — CBC
HCT: 40.7 % (ref 39.0–52.0)
Hemoglobin: 12.3 g/dL — ABNORMAL LOW (ref 13.0–17.0)
MCH: 24 pg — ABNORMAL LOW (ref 26.0–34.0)
MCHC: 30.2 g/dL (ref 30.0–36.0)
MCV: 79.5 fL — ABNORMAL LOW (ref 80.0–100.0)
Platelets: 325 10*3/uL (ref 150–400)
RBC: 5.12 MIL/uL (ref 4.22–5.81)
RDW: 17 % — ABNORMAL HIGH (ref 11.5–15.5)
WBC: 7.2 10*3/uL (ref 4.0–10.5)
nRBC: 0 % (ref 0.0–0.2)

## 2020-12-01 LAB — PHOSPHORUS: Phosphorus: 3.7 mg/dL (ref 2.5–4.6)

## 2020-12-01 MED ORDER — CALCITONIN (SALMON) 200 UNIT/ML IJ SOLN
4.0000 [IU]/kg | Freq: Once | INTRAMUSCULAR | Status: AC
  Administered 2020-12-02: 400 [IU] via INTRAMUSCULAR
  Filled 2020-12-01: qty 2

## 2020-12-01 MED ORDER — SODIUM CHLORIDE 0.9 % IV BOLUS
1000.0000 mL | Freq: Once | INTRAVENOUS | Status: AC
  Administered 2020-12-01: 1000 mL via INTRAVENOUS

## 2020-12-01 MED ORDER — ALBUTEROL SULFATE HFA 108 (90 BASE) MCG/ACT IN AERS
1.0000 | INHALATION_SPRAY | Freq: Four times a day (QID) | RESPIRATORY_TRACT | Status: DC | PRN
  Filled 2020-12-01: qty 6.7

## 2020-12-01 MED ORDER — FENOFIBRATE 54 MG PO TABS
54.0000 mg | ORAL_TABLET | Freq: Every day | ORAL | Status: DC
  Administered 2020-12-03 – 2020-12-05 (×3): 54 mg via ORAL
  Filled 2020-12-01 (×5): qty 1

## 2020-12-01 MED ORDER — APIXABAN 5 MG PO TABS
5.0000 mg | ORAL_TABLET | Freq: Two times a day (BID) | ORAL | Status: DC
  Administered 2020-12-01: 5 mg via ORAL
  Filled 2020-12-01: qty 1

## 2020-12-01 MED ORDER — ZOLEDRONIC ACID 4 MG/5ML IV CONC
4.0000 mg | Freq: Once | INTRAVENOUS | Status: AC
  Administered 2020-12-02: 4 mg via INTRAVENOUS
  Filled 2020-12-01: qty 5

## 2020-12-01 MED ORDER — UMECLIDINIUM-VILANTEROL 62.5-25 MCG/INH IN AEPB
1.0000 | INHALATION_SPRAY | Freq: Every day | RESPIRATORY_TRACT | Status: DC
  Administered 2020-12-04 – 2020-12-05 (×2): 1 via RESPIRATORY_TRACT
  Filled 2020-12-01: qty 14

## 2020-12-01 MED ORDER — GABAPENTIN 100 MG PO CAPS
100.0000 mg | ORAL_CAPSULE | Freq: Three times a day (TID) | ORAL | Status: DC
  Administered 2020-12-01 – 2020-12-05 (×8): 100 mg via ORAL
  Filled 2020-12-01 (×9): qty 1

## 2020-12-01 MED ORDER — OXYCODONE HCL 5 MG PO TABS
5.0000 mg | ORAL_TABLET | ORAL | Status: DC | PRN
Start: 2020-12-01 — End: 2020-12-06
  Administered 2020-12-02 – 2020-12-03 (×2): 10 mg via ORAL
  Filled 2020-12-01 (×3): qty 2

## 2020-12-01 MED ORDER — FUROSEMIDE 20 MG PO TABS: 20.0000 mg | ORAL_TABLET | Freq: Every day | ORAL | Status: DC

## 2020-12-01 MED ORDER — ROSUVASTATIN CALCIUM 10 MG PO TABS
5.0000 mg | ORAL_TABLET | Freq: Every day | ORAL | Status: DC
  Administered 2020-12-03 – 2020-12-05 (×3): 5 mg via ORAL
  Filled 2020-12-01 (×3): qty 1

## 2020-12-01 MED ORDER — ATENOLOL 25 MG PO TABS
50.0000 mg | ORAL_TABLET | Freq: Every day | ORAL | Status: DC
  Administered 2020-12-03 – 2020-12-04 (×2): 50 mg via ORAL
  Filled 2020-12-01 (×2): qty 1

## 2020-12-01 MED ORDER — HALOPERIDOL LACTATE 5 MG/ML IJ SOLN
5.0000 mg | Freq: Four times a day (QID) | INTRAMUSCULAR | Status: DC | PRN
  Administered 2020-12-01 – 2020-12-05 (×2): 5 mg via INTRAMUSCULAR
  Filled 2020-12-01 (×2): qty 1

## 2020-12-01 MED ORDER — B COMPLEX-C PO TABS
ORAL_TABLET | Freq: Every day | ORAL | Status: DC
  Administered 2020-12-03 – 2020-12-05 (×3): 1 via ORAL
  Filled 2020-12-01 (×5): qty 1

## 2020-12-01 MED ORDER — SENNA 8.6 MG PO TABS
1.0000 | ORAL_TABLET | Freq: Every day | ORAL | Status: DC
  Administered 2020-12-01 – 2020-12-04 (×4): 8.6 mg via ORAL
  Filled 2020-12-01 (×4): qty 1

## 2020-12-01 NOTE — ED Notes (Signed)
Patient is having a difficult time following commands. When RN asked patient where he is he continues to repeat his birthday.

## 2020-12-01 NOTE — ED Notes (Signed)
Patient is removed his IV and continues to try to remove the heart monitor and pulse ox

## 2020-12-01 NOTE — ED Triage Notes (Signed)
Pt BIB EMS from [facility]. Staff states pt has abnormal calcium and hgb levels. Also states pt has been more lethargic. 20G LHAND.

## 2020-12-01 NOTE — ED Notes (Signed)
Patient has removed both IVs, monitor, BP cuff, heart monitor, and pulse ox. He also has removed all of his clothing. Patient remains disoriented x 3

## 2020-12-01 NOTE — H&P (Signed)
History and Physical   Andrew Dominguez YIR:[id number] DOB: [birth year] DOA: 11/29/2020  Referring MD/NP/PA: Dr. Lajean Saver  PCP: Baxter Hire, MD   Outpatient Specialists: Dr. Marcene Duos, orthopedics  Patient coming from: Home  Chief Complaint: Confusion  HPI: Andrew Dominguez is a 65 y.o. male with medical history significant of lytic bone lesions suspected metastatic lung cancer, liver masses, pulmonary nodules, COPD, atrial fibrillation on Eliquis, diabetes, diabetic polyneuropathy, history of splenic mass, and left kidney mass who was previously admitted with left hip fracture in November of last year had orthopedic surgery performed.  He also was being followed by oncologist.  Patient came in today with altered mental status weakness and bilateral lower extremity edema.  He is unable to give adequate history although he is oriented to person and self.  He was evaluated in the ER and noted to have a calcium corrected of more than 15.  His other electrolytes are relatively stable.  At this point he appears to have hypercalcemia of malignancy.  Probably arising from his lytic bone lesions.  Patient being admitted to the hospital for evaluation and treatment..  ED Course: Temperature 98.3 blood pressure 90/57 pulse 101 respiratory 34 oxygen sats 95% room air.  Chemistry showed BUN of 59 creatinine 1.91.  Calcium is 14.7.  Corrected calcium is more than 15.  CBC largely within normal.  COVID-19 screen currently pending.  Patient has received Zometa IV hydration as well as calcitonin ordered in the ER.  He is being admitted to the hospital for evaluation and treatment  Review of Systems: As per HPI otherwise 10 point review of systems negative.    Past Medical History:  Diagnosis Date  . Anemia   . Arthritis    knees  . COPD (chronic obstructive pulmonary disease) ([place])   . Diabetes mellitus without complication ([place])   . Hyperlipemia   . Hypertension   . Hypertriglyceridemia    . Hypogonadism in male   . Numbness of foot    bilateral  . Obesity   . Tubular adenoma of colon   . Vitamin D deficiency     Past Surgical History:  Procedure Laterality Date  . CATARACT EXTRACTION W/PHACO Right 04/07/2019   Procedure: CATARACT EXTRACTION PHACO AND INTRAOCULAR LENS PLACEMENT ([place])  RIGHT DIABETIC;  Surgeon: Birder Robson, MD;  Location: [facility];  Service: Ophthalmology;  Laterality: Right;  Diabetic - oral meds  . COLONOSCOPY    . COLONOSCOPY WITH PROPOFOL N/A 05/16/2015   Procedure: COLONOSCOPY WITH PROPOFOL;  Surgeon: Manya Silvas, MD;  Location[facility] END[facility]COPY;  Service: Endoscopy;  Laterality: N/A;  . FEMUR IM NAIL Left 09/25/2020   Procedure: INTRAMEDULLARY (IM) NAIL HIP SCREW;  Surgeon: Meredith Pel, MD;  Location: [place];  Service: Orthopedics;  Laterality: Left;  . ORIF FINGER / THUMB FRACTURE Left    thumb, 1970s     reports that he quit smoking about 22 years ago. He has never used smokeless tobacco. He reports current alcohol use of about 14.0 standard drinks of alcohol per week. He reports that he does not use drugs.  Allergies  Allergen Reactions  . Sulfa Antibiotics Rash    Family History  Problem Relation Age of Onset  . Lung cancer Father   . Prostate cancer Neg Hx   . Bladder Cancer Neg Hx   . Kidney cancer Neg Hx      Prior to Admission medications   Medication Sig Start Date End Date Taking?  Authorizing Provider  albuterol (VENTOLIN HFA) 108 (90 Base) MCG/ACT inhaler Inhale into the lungs every 6 (six) hours as needed for wheezing or shortness of breath. Patient not taking: Reported on 09/25/2020    [provider]  amLODipine (NORVASC) 10 MG tablet Take 10 mg by mouth daily.    [provider]  ANORO ELLIPTA 62.5-25 MCG/INH AEPB Inhale 1 puff into the lungs daily. 11/07/20   [provider]  aspirin EC 81 MG tablet Take 81 mg by mouth daily. Patient not taking: Reported on 08/23/2020     [provider]  atenolol (TENORMIN) 50 MG tablet Take 50 mg by mouth daily.    [provider]  B Complex Vitamins (VITAMIN B COMPLEX PO) Take by mouth daily.  Patient not taking: Reported on 09/24/2020    [provider]  Blood Glucose Monitoring Suppl (GLUCOCOM BLOOD GLUCOSE MONITOR) DEVI 1 each by XX route as directed. 07/29/15   [provider]  diltiazem (CARDIZEM CD) 300 MG 24 hr capsule Take 1 capsule (300 mg total) by mouth daily. 10/03/20 10/03/21  Charlynne Cousins, MD  ELIQUIS 5 MG TABS tablet Take 5 mg by mouth 2 (two) times daily. 07/28/20   [provider]  empagliflozin (JARDIANCE) 25 MG TABS tablet Take 25 mg by mouth daily.  02/23/20 02/22/21  [provider]  fenofibrate 54 MG tablet Take 54 mg by mouth daily.    [provider]  furosemide (LASIX) 20 MG tablet Take 20 mg by mouth daily.  02/03/18   [provider]  gabapentin (NEURONTIN) 100 MG capsule Take 100 mg by mouth 3 (three) times daily.    [provider]  glucose blood (ONETOUCH ULTRA) test strip Use 3 (three) times daily E11.29 08/21/19   [provider]  Insulin Pen Needle (B-D UF III MINI PEN NEEDLES) 31G X 5 MM MISC Use once daily E11.29 11/25/19   [provider]  losartan (COZAAR) 100 MG tablet Take 100 mg by mouth daily.    [provider]  metFORMIN (GLUCOPHAGE) 1000 MG tablet Take 1,000 mg by mouth 2 (two) times daily with a meal.    [provider]  methocarbamol (ROBAXIN) 500 MG tablet Take 1 tablet (500 mg total) by mouth every 6 (six) hours as needed for muscle spasms. 10/03/20   Charlynne Cousins, MD  oxyCODONE (OXY IR/ROXICODONE) 5 MG immediate release tablet Take 1-2 tablets (5-10 mg total) by mouth every 4 (four) hours as needed for moderate pain (pain score 4-6). 10/03/20   Charlynne Cousins, MD  rosuvastatin (CRESTOR) 5 MG tablet Take 5 mg by mouth daily.    [provider]   Umeclidinium-Vilanterol (ANORO ELLIPTA IN) Inhale into the lungs daily.    [provider]  VICTOZA 18 MG/3ML SOPN Inject 1.8 mg into the skin at bedtime.  12/01/16   [provider]    Physical Exam: Vitals:   11/23/2020 1900 11/17/2020 2000 11/23/2020 2153 11/07/2020 2225  BP: 117/67 112/68 106/68 107/65  Pulse: 94  89 (!) 101  Resp: 20 20 (!) 22 20  Temp: 98.3 F (36.8 C)     TempSrc: Oral     SpO2: 95%  98% 98%  Weight:    132.5 kg      Constitutional: Chronically ill looking, anasarca Vitals:   11/26/2020 1900 11/08/2020 2000 11/26/2020 2153 11/08/2020 2225  BP: 117/67 112/68 106/68 107/65  Pulse: 94  89 (!) 101  Resp: 20 20 (!)  22 20  Temp: 98.3 F (36.8 C)     TempSrc: Oral     SpO2: 95%  98% 98%  Weight:    132.5 kg   Eyes: PERRL, lids and conjunctivae normal ENMT: Mucous membranes are moist. Posterior pharynx clear of any exudate or lesions.Normal dentition.  Neck: normal, supple, no masses, no thyromegaly Respiratory: clear to auscultation bilaterally, no wheezing, no crackles. Normal respiratory effort. No accessory muscle use.  Cardiovascular: Irregularly irregular rate and rhythm, no murmurs / rubs / gallops.  2+ pedal edema. 2+ pedal pulses. No carotid bruits.  Abdomen: no tenderness, no masses palpated. No hepatosplenomegaly. Bowel sounds positive.  Musculoskeletal: no clubbing / cyanosis. No joint deformity upper and lower extremities. Good ROM, no contractures. Normal muscle tone.  Skin: no rashes, lesions, ulcers. No induration Neurologic: CN 2-12 grossly intact. Sensation intact, DTR normal. Strength 5/5 in all 4.  Psychiatric: Confused oriented x1.     Labs on Admission: I have personally reviewed following labs and imaging studies  CBC: Recent Labs  Lab 11/06/2020 1801  WBC 7.2  HGB 12.3*  HCT 40.7  MCV 79.5*  PLT 950   Basic Metabolic Panel: Recent Labs  Lab 11/13/2020 1801  NA 139  K 4.5  CL 104  CO2 22  GLUCOSE 93  BUN 59*   CREATININE 1.91*  CALCIUM 14.7*  PHOS 3.7   GFR: Estimated Creatinine Clearance: 55 mL/min (A) (by C-G formula based on SCr of 1.91 mg/dL (H)). Liver Function Tests: Recent Labs  Lab 11/30/2020 1801  AST 17  ALT 14  ALKPHOS 72  BILITOT 0.9  PROT 5.7*  ALBUMIN 2.9*   No results for input(s): LIPASE, AMYLASE in the last 168 hours. No results for input(s): AMMONIA in the last 168 hours. Coagulation Profile: No results for input(s): INR, PROTIME in the last 168 hours. Cardiac Enzymes: No results for input(s): CKTOTAL, CKMB, CKMBINDEX, TROPONINI in the last 168 hours. BNP (last 3 results) No results for input(s): PROBNP in the last 8760 hours. HbA1C: No results for input(s): HGBA1C in the last 72 hours. CBG: No results for input(s): GLUCAP in the last 168 hours. Lipid Profile: No results for input(s): CHOL, HDL, LDLCALC, TRIG, CHOLHDL, LDLDIRECT in the last 72 hours. Thyroid Function Tests: No results for input(s): TSH, T4TOTAL, FREET4, T3FREE, THYROIDAB in the last 72 hours. Anemia Panel: No results for input(s): VITAMINB12, FOLATE, FERRITIN, TIBC, IRON, RETICCTPCT in the last 72 hours. Urine analysis: No results found for: COLORURINE, APPEARANCEUR, LABSPEC, PHURINE, GLUCOSEU, HGBUR, BILIRUBINUR, KETONESUR, PROTEINUR, UROBILINOGEN, NITRITE, LEUKOCYTESUR Sepsis Labs: @LABRCNTIP (procalcitonin:4,lacticidven:4) )No results found for this or any previous visit (from the past 240 hour(s)).   Radiological Exams on Admission: No results found.    Assessment/Plan Principal Problem:   Hypercalcemia Active Problems:   Obesity   Hyperlipidemia, unspecified   Diabetes mellitus (HCC)   Bilateral leg edema   Liver metastases (HCC)   Atrial fibrillation with RVR ([place])     #1 hypercalcemia of malignancy: Patient will be admitted to the hospital.  Initiate hydration.  Already received Zometa and Miacalcin.  Monitor calcium level.  If improved patient could be discharged to  follow-up with oncology otherwise oncology consult in-house to address primary source.  #2 diabetes: Initiate sliding scale insulin  #3 chronic atrial fibrillation: On Eliquis.  We will continue.  Also rate control.  #4 morbid obesity: Dietary counseling  #5 hyperlipidemia: Resume home regimen  #6 metastatic cancer: Has cancer to multiple sites.  This is a chronic  issue.  Follow-up oncology  #7 bilateral lower extremity edema: Most likely related to his heart disease.  Also low albumin.  Third spacing most likely.  Continue monitoring    DVT prophylaxis: Eliquis Code Status: Full code Family Communication: No family at bedside Disposition Plan: Home Consults called: None but medical oncology in the morning Admission status: Inpatient  Severity of Illness: The appropriate patient status for this patient is INPATIENT. Inpatient status is judged to be reasonable and necessary in order to provide the required intensity of service to ensure the patient's safety. The patient's presenting symptoms, physical exam findings, and initial radiographic and laboratory data in the context of their chronic comorbidities is felt to place them at high risk for further clinical deterioration. Furthermore, it is not anticipated that the patient will be medically stable for discharge from the hospital within 2 midnights of admission. The following factors support the patient status of inpatient.   " The patient's presenting symptoms include altered mental status. " The worrisome physical exam findings include chronically ill looking. " The initial radiographic and laboratory data are worrisome because of calcium of 14.7. " The chronic co-morbidities include metastatic cancer.   * I certify that at the point of admission it is my clinical judgment that the patient will require inpatient hospital care spanning beyond 2 midnights from the point of admission due to high intensity of service, high risk for  further deterioration and high frequency of surveillance required.Barbette Merino MD [facility] Pager [phone]  If 7PM-7AM, please contact night-coverage [email or web address] Password [facility]  11/30/2020, 10:38 PM

## 2020-12-01 NOTE — ED Provider Notes (Signed)
[place] DEPT Provider Note   CSN: [id number] Arrival date & time: 11/15/2020  1659     History Chief Complaint  Patient presents with  . Abnormal Lab  . Fatigue    Andrew Dominguez is a 65 y.o. male.  Patient with hx metastatic cancer sent from SNF to ED via EMS with generalized weakness, and reported lab abnormalities including Ca very high. Pt denies specific pain. +nausea. No vomiting or diarrhea. Denies focal or unilateral numbness or weakness. No change in speech or vision. Symptoms gradual onset, moderate, constant, persistent.  Pt unsure what type of cancer he has - denies any recent therapy. Denies fever or chills.  Pt poor historian - level 5 caveat.   The history is provided by the patient and the EMS personnel. The history is limited by the condition of the patient.  Abnormal Lab      Past Medical History:  Diagnosis Date  . Anemia   . Arthritis    knees  . COPD (chronic obstructive pulmonary disease) ([place])   . Diabetes mellitus without complication ([place])   . Hyperlipemia   . Hypertension   . Hypertriglyceridemia   . Hypogonadism in male   . Numbness of foot    bilateral  . Obesity   . Tubular adenoma of colon   . Vitamin D deficiency     Patient Active Problem List   Diagnosis Date Noted  . Atrial fibrillation with RVR ([place]) 09/27/2020  . Fever 09/27/2020  . Hip fracture ([place]) 09/24/2020  . S/p left hip fracture 09/24/2020  . Liver metastases ([place]) 09/12/2020  . Lung nodules 09/12/2020  . Lytic bone lesions on xray 08/23/2020  . Type 2 diabetes mellitus ([place]) 01/01/2017  . Obesity 01/01/2017  . Hypertriglyceridemia 01/01/2017  . Hypertension 01/01/2017  . Hyperlipidemia, unspecified 01/01/2017  . Hyperlipidemia 01/01/2017  . Diabetes mellitus ([place]) 01/01/2017  . Vitamin D deficiency 03/01/2015  . Bilateral leg edema 08/26/2014    Past Surgical History:  Procedure Laterality Date  . CATARACT EXTRACTION  W/PHACO Right 04/07/2019   Procedure: CATARACT EXTRACTION PHACO AND INTRAOCULAR LENS PLACEMENT ([place])  RIGHT DIABETIC;  Surgeon: Birder Robson, MD;  Location: [facility];  Service: Ophthalmology;  Laterality: Right;  Diabetic - oral meds  . COLONOSCOPY    . COLONOSCOPY WITH PROPOFOL N/A 05/16/2015   Procedure: COLONOSCOPY WITH PROPOFOL;  Surgeon: Manya Silvas, MD;  Location: [facility] ENDOSCOPY;  Service: Endoscopy;  Laterality: N/A;  . FEMUR IM NAIL Left 09/25/2020   Procedure: INTRAMEDULLARY (IM) NAIL HIP SCREW;  Surgeon: Meredith Pel, MD;  Location: [place];  Service: Orthopedics;  Laterality: Left;  . ORIF FINGER / THUMB FRACTURE Left    thumb, 1970s       Family History  Problem Relation Age of Onset  . Lung cancer Father   . Prostate cancer Neg Hx   . Bladder Cancer Neg Hx   . Kidney cancer Neg Hx     Social History   Tobacco Use  . Smoking status: Former Smoker    Quit date: 2000    Years since quitting: 22.0  . Smokeless tobacco: Never Used  Vaping Use  . Vaping Use: Never used  Substance Use Topics  . Alcohol use: Yes    Alcohol/week: 14.0 standard drinks    Types: 14 Shots of liquor per week  . Drug use: No    Home Medications Prior to Admission medications   Medication Sig Start Date End Date  Taking? Authorizing Provider  albuterol (VENTOLIN HFA) 108 (90 Base) MCG/ACT inhaler Inhale into the lungs every 6 (six) hours as needed for wheezing or shortness of breath. Patient not taking: Reported on 09/25/2020    [provider]  amLODipine (NORVASC) 10 MG tablet Take 10 mg by mouth daily.    [provider]  aspirin EC 81 MG tablet Take 81 mg by mouth daily. Patient not taking: Reported on 08/23/2020    [provider]  atenolol (TENORMIN) 50 MG tablet Take 50 mg by mouth daily.    [provider]  B Complex Vitamins (VITAMIN B COMPLEX PO) Take by mouth daily.  Patient not taking: Reported on 09/24/2020     [provider]  Blood Glucose Monitoring Suppl (GLUCOCOM BLOOD GLUCOSE MONITOR) DEVI 1 each by XX route as directed. 07/29/15   [provider]  diltiazem (CARDIZEM CD) 300 MG 24 hr capsule Take 1 capsule (300 mg total) by mouth daily. 10/03/20 10/03/21  Charlynne Cousins, MD  ELIQUIS 5 MG TABS tablet Take 5 mg by mouth 2 (two) times daily. 07/28/20   [provider]  empagliflozin (JARDIANCE) 25 MG TABS tablet Take 25 mg by mouth daily.  02/23/20 02/22/21  [provider]  fenofibrate 54 MG tablet Take 54 mg by mouth daily.    [provider]  furosemide (LASIX) 20 MG tablet Take 20 mg by mouth daily.  02/03/18   [provider]  gabapentin (NEURONTIN) 100 MG capsule Take 100 mg by mouth 3 (three) times daily.    [provider]  glucose blood (ONETOUCH ULTRA) test strip Use 3 (three) times daily E11.29 08/21/19   [provider]  Insulin Pen Needle (B-D UF III MINI PEN NEEDLES) 31G X 5 MM MISC Use once daily E11.29 11/25/19   [provider]  losartan (COZAAR) 100 MG tablet Take 100 mg by mouth daily.    [provider]  metFORMIN (GLUCOPHAGE) 1000 MG tablet Take 1,000 mg by mouth 2 (two) times daily with a meal.    [provider]  methocarbamol (ROBAXIN) 500 MG tablet Take 1 tablet (500 mg total) by mouth every 6 (six) hours as needed for muscle spasms. 10/03/20   Charlynne Cousins, MD  oxyCODONE (OXY IR/ROXICODONE) 5 MG immediate release tablet Take 1-2 tablets (5-10 mg total) by mouth every 4 (four) hours as needed for moderate pain (pain score 4-6). 10/03/20   Charlynne Cousins, MD  rosuvastatin (CRESTOR) 5 MG tablet Take 5 mg by mouth daily.    [provider]  Umeclidinium-Vilanterol (ANORO ELLIPTA IN) Inhale into the lungs daily.    [provider]  VICTOZA 18 MG/3ML SOPN Inject 1.8 mg into the skin at bedtime.  12/01/16   [provider]    Allergies    Sulfa  antibiotics  Review of Systems   Review of Systems  Constitutional: Negative for chills and fever.  HENT: Negative for sore throat.   Eyes: Negative for redness.  Respiratory: Negative for cough and shortness of breath.   Cardiovascular: Negative for chest pain.  Gastrointestinal: Negative for abdominal pain, diarrhea and vomiting.  Genitourinary: Negative for dysuria and flank pain.  Musculoskeletal: Negative for back pain and neck pain.  Skin: Negative for rash.  Neurological: Negative for headaches.  Hematological:       Is on eliquis, hx afib, denies abnormal bruising/bleeding.   Psychiatric/Behavioral: Negative for agitation.    Physical Exam Updated Vital Signs BP 104/61 (  BP Location: Right Arm)   Pulse 91   Temp 98.3 F (36.8 C) (Oral)   Resp (!) 34   SpO2 98%   Physical Exam Vitals and nursing note reviewed.  Constitutional:      Appearance: Normal appearance. He is well-developed.  HENT:     Head: Atraumatic.     Nose: Nose normal.     Mouth/Throat:     Mouth: Mucous membranes are moist.     Pharynx: Oropharynx is clear.  Eyes:     General: No scleral icterus.    Conjunctiva/sclera: Conjunctivae normal.     Comments: Right pupil irregular (prior cataract surgery).   Neck:     Trachea: No tracheal deviation.     Comments: No stiffness or rigidity.  Cardiovascular:     Rate and Rhythm: Normal rate and regular rhythm.     Pulses: Normal pulses.     Heart sounds: Normal heart sounds. No murmur heard. No friction rub. No gallop.   Pulmonary:     Effort: Pulmonary effort is normal. No accessory muscle usage or respiratory distress.     Breath sounds: Normal breath sounds.  Abdominal:     General: Bowel sounds are normal.     Palpations: Abdomen is soft.     Tenderness: There is no abdominal tenderness. There is no guarding.  Genitourinary:    Comments: No cva tenderness. Musculoskeletal:        General: No tenderness.     Cervical back: Normal range of  motion and neck supple. No rigidity.     Comments: Symmetric bilateral lower leg edema.   Skin:    General: Skin is warm and dry.     Findings: No rash.  Neurological:     Mental Status: He is alert.     Comments: Alert, speech clear. Motor/sens grossly intact bil.   Psychiatric:        Mood and Affect: Mood normal.     ED Results / Procedures / Treatments   Labs (all labs ordered are listed, but only abnormal results are displayed) Results for orders placed or performed during the hospital encounter of 11/17/2020  CBC  Result Value Ref Range   WBC 7.2 4.0 - 10.5 K/uL   RBC 5.12 4.22 - 5.81 MIL/uL   Hemoglobin 12.3 (L) 13.0 - 17.0 g/dL   HCT 40.7 39.0 - 52.0 %   MCV 79.5 (L) 80.0 - 100.0 fL   MCH 24.0 (L) 26.0 - 34.0 pg   MCHC 30.2 30.0 - 36.0 g/dL   RDW 17.0 (H) 11.5 - 15.5 %   Platelets 325 150 - 400 K/uL   nRBC 0.0 0.0 - 0.2 %  Comprehensive metabolic panel  Result Value Ref Range   Sodium 139 135 - 145 mmol/L   Potassium 4.5 3.5 - 5.1 mmol/L   Chloride 104 98 - 111 mmol/L   CO2 22 22 - 32 mmol/L   Glucose, Bld 93 70 - 99 mg/dL   BUN 59 (H) 8 - 23 mg/dL   Creatinine, Ser 1.91 (H) 0.61 - 1.24 mg/dL   Calcium 14.7 (HH) 8.9 - 10.3 mg/dL   Total Protein 5.7 (L) 6.5 - 8.1 g/dL   Albumin 2.9 (L) 3.5 - 5.0 g/dL   AST 17 15 - 41 U/L   ALT 14 0 - 44 U/L   Alkaline Phosphatase 72 38 - 126 U/L   Total Bilirubin 0.9 0.3 - 1.2 mg/dL   GFR, Estimated 39 (L) >60 mL/min  Anion gap 13 5 - 15  Phosphorus  Result Value Ref Range   Phosphorus 3.7 2.5 - 4.6 mg/dL   XR FEMUR MIN 2 VIEWS LEFT  Result Date: 11/16/2020 AP lateral left femur reviewed.  Intramedullary nail transfixes subtrochanteric femur fracture.  Not much callus formation present in the upper subtrochanteric region.  No lucencies or loosening of the hardware.  No screw breakage.   EKG EKG Interpretation  Date/Time:  Thursday December 01 2020 17:21:40 EST Ventricular Rate:  96 PR Interval:    QRS  Duration: 112 QT Interval:  328 QTC Calculation: 415 R Axis:   78 Text Interpretation: Atrial fibrillation Borderline intraventricular conduction delay Low voltage, extremity and precordial leads Nonspecific T wave abnormality Confirmed by Lajean Saver [phone]) on 11/12/2020 5:26:47 PM   Radiology No results found.  Procedures Procedures    Medications Ordered in ED Medications  sodium chloride 0.9 % bolus 1,000 mL (has no administration in time range)    ED Course  I have reviewed the triage vital signs and the nursing notes.  Pertinent labs & imaging results that were available during my care of the patient were reviewed by me and considered in my medical decision making (see chart for details).    MDM Rules/Calculators/A&P                         Iv ns. Continuous pulse ox and cardiac monitoring. Ecg. Labs. Iv ns bolus.   Reviewed nursing notes and prior charts for additional history.   Additional ns bolus.  Initial labs reviewed/interpreted by me - wbc normal. Await chemistries.   Additional labs reviewed/interpreted by me - Ca very high, corrected for low albumin = 15.6.    Additional ns bolus. Calcitonin im. Zoledronic acid iv.   CRITICAL CARE RE: severe hypercalcemia/hypercalcemia of malignancy, aggressive iv hydration and tx calcitonin/ZA. Performed by: Mirna Mires Total critical care time: 45 minutes Critical care time was exclusive of separately billable procedures and treating other patients. Critical care was necessary to treat or prevent imminent or life-threatening deterioration. Critical care was time spent personally by me on the following activities: development of treatment plan with patient and/or surrogate as well as nursing, discussions with consultants, evaluation of patient's response to treatment, examination of patient, obtaining history from patient or surrogate, ordering and performing treatments and interventions, ordering and review of  laboratory studies, ordering and review of radiographic studies, pulse oximetry and re-evaluation of patient's condition.     Final Clinical Impression(s) / ED Diagnoses Final diagnoses:  None    Rx / DC Orders ED Discharge Orders    None       Lajean Saver, MD 11/21/2020 2005

## 2020-12-02 ENCOUNTER — Inpatient Hospital Stay (HOSPITAL_COMMUNITY): Payer: 59

## 2020-12-02 ENCOUNTER — Inpatient Hospital Stay: Payer: Self-pay

## 2020-12-02 ENCOUNTER — Other Ambulatory Visit: Payer: Self-pay

## 2020-12-02 ENCOUNTER — Other Ambulatory Visit: Payer: Self-pay | Admitting: Hematology and Oncology

## 2020-12-02 ENCOUNTER — Encounter (HOSPITAL_COMMUNITY): Payer: Self-pay | Admitting: Internal Medicine

## 2020-12-02 DIAGNOSIS — A419 Sepsis, unspecified organism: Secondary | ICD-10-CM | POA: Diagnosis not present

## 2020-12-02 DIAGNOSIS — Z0189 Encounter for other specified special examinations: Secondary | ICD-10-CM

## 2020-12-02 DIAGNOSIS — J9601 Acute respiratory failure with hypoxia: Secondary | ICD-10-CM | POA: Diagnosis not present

## 2020-12-02 DIAGNOSIS — Z7189 Other specified counseling: Secondary | ICD-10-CM | POA: Diagnosis not present

## 2020-12-02 DIAGNOSIS — C8330 Diffuse large B-cell lymphoma, unspecified site: Secondary | ICD-10-CM | POA: Insufficient documentation

## 2020-12-02 LAB — CBC
HCT: 40.2 % (ref 39.0–52.0)
Hemoglobin: 11.9 g/dL — ABNORMAL LOW (ref 13.0–17.0)
MCH: 23.7 pg — ABNORMAL LOW (ref 26.0–34.0)
MCHC: 29.6 g/dL — ABNORMAL LOW (ref 30.0–36.0)
MCV: 80.1 fL (ref 80.0–100.0)
Platelets: 257 10*3/uL (ref 150–400)
RBC: 5.02 MIL/uL (ref 4.22–5.81)
RDW: 16.8 % — ABNORMAL HIGH (ref 11.5–15.5)
WBC: 5.8 10*3/uL (ref 4.0–10.5)
nRBC: 0 % (ref 0.0–0.2)

## 2020-12-02 LAB — COMPREHENSIVE METABOLIC PANEL
ALT: 14 U/L (ref 0–44)
AST: 16 U/L (ref 15–41)
Albumin: 2.9 g/dL — ABNORMAL LOW (ref 3.5–5.0)
Alkaline Phosphatase: 70 U/L (ref 38–126)
Anion gap: 12 (ref 5–15)
BUN: 55 mg/dL — ABNORMAL HIGH (ref 8–23)
CO2: 23 mmol/L (ref 22–32)
Calcium: 14.1 mg/dL (ref 8.9–10.3)
Chloride: 105 mmol/L (ref 98–111)
Creatinine, Ser: 1.69 mg/dL — ABNORMAL HIGH (ref 0.61–1.24)
GFR, Estimated: 45 mL/min — ABNORMAL LOW (ref >60)
Glucose, Bld: 90 mg/dL (ref 70–99)
Potassium: 4.3 mmol/L (ref 3.5–5.1)
Sodium: 140 mmol/L (ref 135–145)
Total Bilirubin: 0.9 mg/dL (ref 0.3–1.2)
Total Protein: 5.6 g/dL — ABNORMAL LOW (ref 6.5–8.1)

## 2020-12-02 LAB — RASBURICASE - URIC ACID: Uric Acid, Serum: 17.4 mg/dL — ABNORMAL HIGH (ref 3.7–8.6)

## 2020-12-02 LAB — BLOOD GAS, ARTERIAL
Acid-base deficit: 1.7 mmol/L (ref 0.0–2.0)
Bicarbonate: 22.7 mmol/L (ref 20.0–28.0)
Drawn by: 25788
O2 Saturation: 93.2 %
Patient temperature: 98.6
pCO2 arterial: 39.7 mmHg (ref 32.0–48.0)
pH, Arterial: 7.376 (ref 7.350–7.450)
pO2, Arterial: 73 mmHg — ABNORMAL LOW (ref 83.0–108.0)

## 2020-12-02 LAB — GLUCOSE, CAPILLARY
Glucose-Capillary: 84 mg/dL (ref 70–99)
Glucose-Capillary: 88 mg/dL (ref 70–99)
Glucose-Capillary: 121 mg/dL — ABNORMAL HIGH (ref 70–99)
Glucose-Capillary: 116 mg/dL — ABNORMAL HIGH (ref 70–99)

## 2020-12-02 LAB — URIC ACID: Uric Acid, Serum: 20.1 mg/dL — ABNORMAL HIGH (ref 3.7–8.6)

## 2020-12-02 LAB — CALCIUM, IONIZED: Calcium, Ionized, Serum: 8.9 mg/dL — ABNORMAL HIGH (ref 4.5–5.6)

## 2020-12-02 LAB — HEPATITIS PANEL, ACUTE
HCV Ab: NONREACTIVE
Hep A IgM: NONREACTIVE
Hep B C IgM: NONREACTIVE
Hepatitis B Surface Ag: NONREACTIVE

## 2020-12-02 LAB — ECHOCARDIOGRAM COMPLETE
Area-P 1/2: 3.42 cm2
Height: 72 in
S' Lateral: 2.6 cm
Weight: 4673.75 oz

## 2020-12-02 LAB — HEMOGLOBIN A1C
Hgb A1c MFr Bld: 5 % (ref 4.8–5.6)
Mean Plasma Glucose: 96.8 mg/dL

## 2020-12-02 LAB — SARS CORONAVIRUS 2 (TAT 6-24 HRS): SARS Coronavirus 2: NEGATIVE

## 2020-12-02 LAB — SARS CORONAVIRUS 2 BY RT PCR (HOSPITAL ORDER, PERFORMED IN ~~LOC~~ HOSPITAL LAB): SARS Coronavirus 2: NEGATIVE

## 2020-12-02 LAB — PARATHYROID HORMONE, INTACT (NO CA): PTH: 9 pg/mL — ABNORMAL LOW (ref 15–65)

## 2020-12-02 LAB — CBG MONITORING, ED: Glucose-Capillary: 84 mg/dL (ref 70–99)

## 2020-12-02 LAB — LACTATE DEHYDROGENASE: LDH: 345 U/L — ABNORMAL HIGH (ref 98–192)

## 2020-12-02 MED ORDER — FUROSEMIDE 10 MG/ML IJ SOLN
20.0000 mg | Freq: Every day | INTRAMUSCULAR | Status: DC
  Administered 2020-12-02 – 2020-12-04 (×3): 20 mg via INTRAVENOUS
  Filled 2020-12-02 (×2): qty 2

## 2020-12-02 MED ORDER — PALONOSETRON HCL INJECTION 0.25 MG/5ML
0.2500 mg | Freq: Once | INTRAVENOUS | Status: AC
  Administered 2020-12-03: 0.25 mg via INTRAVENOUS
  Filled 2020-12-02: qty 5

## 2020-12-02 MED ORDER — SODIUM CHLORIDE 0.9 % IV SOLN
150.0000 mg | Freq: Once | INTRAVENOUS | Status: AC
  Administered 2020-12-03: 150 mg via INTRAVENOUS
  Filled 2020-12-02: qty 5

## 2020-12-02 MED ORDER — METHYLPREDNISOLONE SODIUM SUCC 125 MG IJ SOLR
80.0000 mg | Freq: Every day | INTRAMUSCULAR | Status: DC
  Administered 2020-12-02 – 2020-12-06 (×5): 80 mg via INTRAVENOUS
  Filled 2020-12-02 (×5): qty 2

## 2020-12-02 MED ORDER — SODIUM CHLORIDE 0.9% FLUSH
10.0000 mL | Freq: Two times a day (BID) | INTRAVENOUS | Status: DC
  Administered 2020-12-03: 20 mL
  Administered 2020-12-03 – 2020-12-04 (×2): 10 mL
  Administered 2020-12-04: 30 mL
  Administered 2020-12-05: 20 mL
  Administered 2020-12-05: 10 mL

## 2020-12-02 MED ORDER — CHLORHEXIDINE GLUCONATE CLOTH 2 % EX PADS
6.0000 | MEDICATED_PAD | Freq: Every day | CUTANEOUS | Status: DC
  Administered 2020-12-02 – 2020-12-06 (×5): 6 via TOPICAL

## 2020-12-02 MED ORDER — DOXORUBICIN HCL CHEMO IV INJECTION 2 MG/ML
50.0000 mg/m2 | Freq: Once | INTRAVENOUS | Status: AC
  Administered 2020-12-03: 130 mg via INTRAVENOUS
  Filled 2020-12-02: qty 65

## 2020-12-02 MED ORDER — SODIUM CHLORIDE 0.9 % IV SOLN: 10.0000 mg | Freq: Once | INTRAVENOUS | Status: DC

## 2020-12-02 MED ORDER — INSULIN ASPART 100 UNIT/ML ~~LOC~~ SOLN
0.0000 [IU] | Freq: Three times a day (TID) | SUBCUTANEOUS | Status: DC
  Administered 2020-12-02 – 2020-12-03 (×2): 3 [IU] via SUBCUTANEOUS
  Administered 2020-12-04: 4 [IU] via SUBCUTANEOUS
  Administered 2020-12-04 – 2020-12-05 (×2): 3 [IU] via SUBCUTANEOUS
  Administered 2020-12-06: 4 [IU] via SUBCUTANEOUS
  Filled 2020-12-02: qty 0.2

## 2020-12-02 MED ORDER — PERFLUTREN LIPID MICROSPHERE
1.0000 mL | INTRAVENOUS | Status: AC | PRN
  Administered 2020-12-02: 1 mL via INTRAVENOUS
  Filled 2020-12-02: qty 10

## 2020-12-02 MED ORDER — SODIUM CHLORIDE 0.9 % IV SOLN
750.0000 mg/m2 | Freq: Once | INTRAVENOUS | Status: AC
  Administered 2020-12-03: 1940 mg via INTRAVENOUS
  Filled 2020-12-02: qty 97

## 2020-12-02 MED ORDER — SODIUM CHLORIDE 0.9 % IV SOLN: INTRAVENOUS | Status: DC

## 2020-12-02 MED ORDER — SODIUM CHLORIDE 0.9 % IV SOLN
6.0000 mg | Freq: Once | INTRAVENOUS | Status: AC
  Administered 2020-12-02: 6 mg via INTRAVENOUS
  Filled 2020-12-02: qty 4

## 2020-12-02 MED ORDER — ONDANSETRON HCL 4 MG/2ML IJ SOLN: 4.0000 mg | Freq: Four times a day (QID) | INTRAMUSCULAR | Status: DC | PRN

## 2020-12-02 MED ORDER — IOHEXOL 300 MG/ML  SOLN
80.0000 mL | Freq: Once | INTRAMUSCULAR | Status: AC | PRN
  Administered 2020-12-02: 80 mL via INTRAVENOUS

## 2020-12-02 MED ORDER — TBO-FILGRASTIM 480 MCG/0.8ML ~~LOC~~ SOSY: 480.0000 ug | PREFILLED_SYRINGE | Freq: Every day | SUBCUTANEOUS | Status: DC

## 2020-12-02 MED ORDER — IOHEXOL 9 MG/ML PO SOLN: 500.0000 mL | ORAL | Status: DC

## 2020-12-02 MED ORDER — GLUCERNA SHAKE PO LIQD
237.0000 mL | Freq: Three times a day (TID) | ORAL | Status: DC
  Administered 2020-12-02 – 2020-12-04 (×8): 237 mL via ORAL
  Filled 2020-12-02 (×14): qty 237

## 2020-12-02 MED ORDER — ALLOPURINOL 100 MG PO TABS
300.0000 mg | ORAL_TABLET | Freq: Every day | ORAL | Status: DC
  Administered 2020-12-03 – 2020-12-05 (×3): 300 mg via ORAL
  Filled 2020-12-02: qty 3
  Filled 2020-12-02 (×2): qty 1
  Filled 2020-12-02: qty 3

## 2020-12-02 MED ORDER — ONDANSETRON HCL 4 MG PO TABS: 4.0000 mg | ORAL_TABLET | Freq: Four times a day (QID) | ORAL | Status: DC | PRN

## 2020-12-02 MED ORDER — VINCRISTINE SULFATE CHEMO INJECTION 1 MG/ML
2.0000 mg | Freq: Once | INTRAVENOUS | Status: AC
  Administered 2020-12-03: 2 mg via INTRAVENOUS
  Filled 2020-12-02: qty 2

## 2020-12-02 MED ORDER — CALCITONIN (SALMON) 200 UNIT/ML IJ SOLN: 100.0000 [IU] | Freq: Every day | INTRAMUSCULAR | Status: DC

## 2020-12-02 MED ORDER — SODIUM CHLORIDE 0.9% FLUSH: 10.0000 mL | INTRAVENOUS | Status: DC | PRN

## 2020-12-02 MED ORDER — PANTOPRAZOLE SODIUM 40 MG IV SOLR
40.0000 mg | Freq: Every day | INTRAVENOUS | Status: DC
  Administered 2020-12-02 – 2020-12-05 (×4): 40 mg via INTRAVENOUS
  Filled 2020-12-02 (×4): qty 40

## 2020-12-02 MED ORDER — INSULIN ASPART 100 UNIT/ML ~~LOC~~ SOLN
0.0000 [IU] | Freq: Every day | SUBCUTANEOUS | Status: DC
  Filled 2020-12-02: qty 0.05

## 2020-12-02 MED ORDER — SODIUM CHLORIDE 0.9 % IV SOLN: Freq: Once | INTRAVENOUS | Status: DC

## 2020-12-02 MED ORDER — CALCITONIN (SALMON) 200 UNIT/ML IJ SOLN
400.0000 [IU] | Freq: Every day | INTRAMUSCULAR | Status: AC
  Administered 2020-12-02 – 2020-12-03 (×2): 400 [IU] via INTRAMUSCULAR
  Filled 2020-12-02 (×2): qty 2

## 2020-12-02 MED ORDER — PROSOURCE PLUS PO LIQD
30.0000 mL | Freq: Three times a day (TID) | ORAL | Status: DC
  Administered 2020-12-02 – 2020-12-06 (×5): 30 mL via ORAL
  Filled 2020-12-02 (×4): qty 30

## 2020-12-02 NOTE — Progress Notes (Signed)
PROGRESS NOTE  Andrew Dominguez  DOB: Apr 24, 1956  PCP: Baxter Hire, MD KKX:[id number]  DOA: 11/26/2020  LOS: 1 day   Chief Complaint  Patient presents with  . Abnormal Lab  . Fatigue    Brief narrative: Andrew Dominguez is a 65 y.o. male with PMH significant for DM2, HTN, HLD, A. fib on Eliquis, COPD, pulmonary nodules, lytic bone lesions suspected metastatic lung cancer, liver masses, splenic mass, and left kidney mass who was recently admitted in November 2021 for left hip fracture and underwent surgical fixation. Patient was sent to the ED by EMS from [facility] for worsening lethargy, abnormal calcium and hemoglobin levels.   In the ED, patient was lethargic and unable to give his history but hemodynamically stable. Required 2 L oxygen by nasal cannula. Labs with creatinine elevated 1.91, calcium level elevated to 14.7, intact PTH low at 9 Suspected to have hypercalcemia related to malignancy. Started on IV hydration and admitted to hospitalist service.  Subjective: Patient was seen and examined this morning. Obese elderly Caucasian male.  Lethargic.  Barely tries to open eyes on sternal rub.  Vital signs stable.  On 2 L oxygen by nasal cannula. Case discussed with oncology.  Recommendation appreciated.  Assessment/Plan: Hypercalcemia of malignancy -Currently getting normal saline at 150 mill per hour. -intact PTH level noted low expected -Noted gradual improvement in calcium level to 14.1 this morning. Continue IV fluid. -He was also given calcitonin 400 units last night.  We will repeat it for next 2 doses. -Continue monitor calcium level. Recent Labs  Lab 11/16/2020 1801 12/02/20 0220  CALCIUM 14.7* 14.1*   Pulmonary adenocarcinoma with widespread metastasis -On chart review, noted that patient was seen in medical oncology clinic in October 2021 for 40 pound weight loss in 4 to 5 months, shortness of breath on exertion -CT scan of chest abdomen pelvis at  that time showed innumerable subsolid groundglass nodules in the lungs, enlarged hilar lymph nodes, bulky multiple hypodense liver masses, splenic masses, left kidney mass as well as lytic lesions in the femur and left ilium. -The findings were most compatible with multifocal pulmonary adenocarcinoma and widespread metastasis. -He was planned liver biopsy but unclear if he underwent that.  Patient has not had a follow-up as planned with oncologist at [facility]. -Oncology and IR consultation appreciated.  Oncology discussed with patient's significant other. -Patient is planned for an updated CT chest, abdomen and pelvis today.  Likely needs liver biopsy.  IR to place a PICC line.  Noted a plan from oncology to initiate chemotherapy this weekend. -Transfer him to 6th floor for planned chemotherapy.  Acute metabolic encephalopathy -Secondary to elevated calcium level. -Expect improvement with improvement in calcium level. -IV sedatives as needed in the interval. -Also I would obtain a blood gas this morning to rule out hypercapnia as the cause of lethargy.  Type 2 diabetes mellitus -Seems controlled at this time. A1c 5 on 1/28. -Sliding scale insulin with Accu-Cheks Recent Labs  Lab 12/02/20 0042 12/02/20 0742  GLUCAP 84 84   Chronic A. Fib -Chronically on Eliquis.  Last dose last night.  Currently on hold in preparation of liver biopsy  Obesity - Body mass index is 39.62 kg/m. Patient has been advised to make an attempt to improve diet and exercise patterns to aid in weight loss.  Bilateral lower extremity edema -Obtain echocardiogram to rule out cardiac etiology.  Impaired mobility -PT eval once mental status improves.  Mobility: PT eval Code Status:  Code Status: Full Code  Nutritional status: Body mass index is 39.62 kg/m. Nutrition Problem: Increased nutrient needs Etiology: cancer and cancer related treatments,chronic illness Signs/Symptoms: estimated needs Diet Order             Diet heart healthy/carb modified Room service appropriate? Yes; Fluid consistency: Thin  Diet effective now                 DVT prophylaxis:    Antimicrobials:  None Fluid: Normal saline at 150 mill per hour Consultants: Oncology, IR Family Communication:  Oncology spoke with significant other  Status is: Inpatient  Remains inpatient appropriate because: Hypercalcemia, needs IV fluid.  Also noted a plan to initiate chemotherapy in the hospital  Dispo: The patient is from: SNF              Anticipated d/c is to: SNF              Anticipated d/c date is: > 3 days              Patient currently is not medically stable to d/c.   Difficult to place patient No       Infusions:  . sodium chloride 150 mL/hr at 12/02/20 0045    Scheduled Meds: . atenolol  50 mg Oral Daily  . B-complex with vitamin C   Oral Daily  . calcitonin  400 Units Intramuscular Daily  . fenofibrate  54 mg Oral Daily  . furosemide  20 mg Intravenous Daily  . gabapentin  100 mg Oral TID  . insulin aspart  0-20 Units Subcutaneous TID WC  . insulin aspart  0-5 Units Subcutaneous QHS  . rosuvastatin  5 mg Oral Daily  . senna  1 tablet Oral QHS  . umeclidinium-vilanterol  1 puff Inhalation Daily    Antimicrobials: Anti-infectives (From admission, onward)   None      PRN meds: albuterol, haloperidol lactate, ondansetron **OR** ondansetron (ZOFRAN) IV, oxyCODONE   Objective: Vitals:   12/02/20 0246 12/02/20 0421  BP: 104/72 97/66  Pulse: 91 96  Resp: 18 18  Temp:  98.4 F (36.9 C)  SpO2: 96% 100%    Intake/Output Summary (Last 24 hours) at 12/02/2020 1107 Last data filed at 12/02/2020 1008 Gross per 24 hour  Intake 1094.02 ml  Output 250 ml  Net 844.02 ml   Filed Weights   12/05/2020 2225 12/02/20 0421  Weight: 132.5 kg 132.5 kg   Weight change:  Body mass index is 39.62 kg/m.   Physical Exam: General exam: Elderly obese Caucasian male.  Lethargic Skin: No rashes, lesions  or ulcers. HEENT: Atraumatic, normocephalic, no obvious bleeding Lungs: Clear to auscultation bilaterally CVS: Regular rate and rhythm, no murmur GI/Abd soft, nontender, nondistended, bowel sound present CNS: Lethargic, tries to open eyes and sternal rub at the best Psychiatry: Unable to examine because of altered mental status Extremities: 1+ bilateral pedal edema  Data Review: I have personally reviewed the laboratory data and studies available.  Recent Labs  Lab 11/26/2020 1801 12/02/20 0220  WBC 7.2 5.8  HGB 12.3* 11.9*  HCT 40.7 40.2  MCV 79.5* 80.1  PLT 325 257   Recent Labs  Lab 11/15/2020 1801 12/02/20 0220  NA 139 140  K 4.5 4.3  CL 104 105  CO2 22 23  GLUCOSE 93 90  BUN 59* 55*  CREATININE 1.91* 1.69*  CALCIUM 14.7* 14.1*  PHOS 3.7  --     F/u labs ordered  Signed, Terrilee Croak,  MD [facility] 12/02/2020

## 2020-12-02 NOTE — Progress Notes (Signed)
Initial Nutrition Assessment  DOCUMENTATION CODES:   Obesity unspecified  INTERVENTION:   -Glucerna Therapeutic Nutrition Shake TID (8 fl oz, each supplement provides 220 kcal, 10 g protein) -Prosource Plus TID (30 mL, each supplement provides 100 kcal, 15 g protein) -encourage PO intake  NUTRITION DIAGNOSIS:   Increased nutrient needs related to cancer and cancer related treatments,chronic illness as evidenced by estimated needs.   GOAL:   Patient will meet greater than or equal to 90% of their needs   MONITOR:   PO intake,Supplement acceptance,Weight trends,Labs  REASON FOR ASSESSMENT:   Malnutrition Screening Tool    ASSESSMENT:   11 YOM presented to ED with abnormal lab (high calcium), fatigue and edema. Pt admitted for hypercalcemia of malignancy. PMH of metastatic lung cancer, liver masses, pulmonary nodules, lytic bone lesions (no treatment plan currently in place); HTN, HLD, arthritis, COPD, hip fracture s/p surgery.   Intern asked pt questions in regards to n/v, diarrhea, abdominal pain, however pt consistently answered with "yes". Unable to determine if this response is accurate. Intern decided to stop questioning d/t pt's alertness and orientation. No family or visitors were in the room during visit.  RD discussed with Oncology NP about pt's plan. NP plans to talk with family today on how to proceed. RD will follow pt's plan to determine appropriate course of action in regards to nutrition. Noted consult for liver biopsy.  Per prior RD notes on 09/26/20, RD spoke with pt and daughter at bedside. Pt indicated that he had not eaten in 3 days PTA, however typically has a good appetite, consuming 2 meals per day. Pt indicated that he had been trying to make healthier choices and was consuming more proteins and vegetables, while cutting back on snacks, such as Little Debbie cakes.   Per prior RD notes on 09/30/20, pt had a meal completion of 100% as well as Glucerna  shakes TID.   Notes indicate pt's weights have trended downwards in the past 2 months. Pt's last weight on 01/28 was 292#, pt's weight on 09/24/20 was 295#. This was a weight loss of 1.4% in the past 2 months which is not significant. Per prior RD note (09/26/20), pt mentioned he estimated a weight loss of about 50# in 4 months that he believed was due to lifestyle changes.   Meds reviewed: B-complex with vitamin C tablet (daily), Lasix (20 mg, daily), Senekot (8.6 mg, daily), 0.9% sodium chloride infusion (150 mL/hr, continuous)  Labs reviewed: BUN (55 mg/dL), creatinine (1.69 mg/dL), calcium (14.1 mg/dL), GFR (45 mL/min), blood glucose (84 mg/dL) x2  NUTRITION - FOCUSED PHYSICAL EXAM:  Flowsheet Row Most Recent Value  Orbital Region No depletion  Upper Arm Region No depletion  Thoracic and Lumbar Region No depletion  Buccal Region No depletion  Temple Region No depletion  Clavicle Bone Region No depletion  Clavicle and Acromion Bone Region No depletion  Scapular Bone Region No depletion  Dorsal Hand No depletion  Patellar Region No depletion  Anterior Thigh Region No depletion  Posterior Calf Region No depletion  Edema (RD Assessment) Mild  Hair Reviewed  Eyes Reviewed  Mouth Reviewed  Skin Reviewed  Nails Reviewed       Diet Order:   Diet Order            Diet heart healthy/carb modified Room service appropriate? Yes; Fluid consistency: Thin  Diet effective now                 EDUCATION NEEDS:  No education needs have been identified at this time  Skin:  Skin Assessment: Reviewed RN Assessment  Last BM:  unknown  Height:   Ht Readings from Last 1 Encounters:  12/02/20 6' (1.829 m)    Weight:   Wt Readings from Last 1 Encounters:  12/02/20 132.5 kg    Ideal Body Weight:  81 kg  BMI:  Body mass index is 39.62 kg/m.  Estimated Nutritional Needs:   Kcal:  2200-2400   Protein:  110-125   Fluid:  >2.2 L    Salvadore Oxford, Dietetic  Intern 12/02/2020 11:16 AM

## 2020-12-02 NOTE — Progress Notes (Signed)

## 2020-12-02 NOTE — Progress Notes (Signed)
IR has received request to evaluate patient for image-guided liver lesion biopsy. Last imaging was 09/01/20. IR has ordered a CT abdomen/pelvis with contrast for up-to-date imaging and decision making. IR will continue to follow.  Please call IR with any questions.  Soyla Dryer, [place] [phone] 12/02/2020, 10:43 AM

## 2020-12-02 NOTE — ED Notes (Signed)
Patient has removed another IV and removed all of equipment and his gown. Patient resting with eyes closed and snoring when RN walked into room. MD notified

## 2020-12-02 NOTE — ED Notes (Signed)
Patient has removed IV, BP cuff, monitor, pulse ox and gown again. Patient continues to be disoriented and unable to redirect. MD notified of patient's condition.

## 2020-12-02 NOTE — Consult Note (Addendum)
[place]  [phone] [phone]   MEDICAL ONCOLOGY - INITIAL CONSULTATION  Referral MD: Dr. 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  He was admitted for a hip fracture in November 2021 and had surgery performed by orthopedics.  Biopsy of his bone lesion showed large B-cell lymphoma.  He has been followed by oncology at [facility].  Additional work-up was planned to initiate treatment, but the patient could not get to their facility as he was residing in a SNF here in [place].  They were in the process of attempting to transfer his care to the [facility] cancer center until he can get moved back to [facility].  The patient was brought to the hospital with altered mental status and bilateral lower extremity edema.  In the ER, corrected calcium was greater than 15.  His BUN was 59 creatinine was 1.91.  The patient was given a dose of Zometa on 12/02/2020.  He has also been started on IV fluids and Lasix.  Calcitonin has also been ordered.  The patient's chart has been reviewed and discussed with nursing and his primary oncologist.  Also talked with the patient's significant other over the telephone.  The patient is very somnolent today and unable to provide review of systems.  Medical oncology was asked see the patient make recommendations regarding his large B-cell lymphoma and hypercalcemia.   Past Medical History:  Diagnosis Date   Anemia    Arthritis    knees   COPD (chronic obstructive pulmonary disease) (HCC)    Diabetes mellitus without complication ([place])    Hyperlipemia    Hypertension    Hypertriglyceridemia    Hypogonadism  in male    Numbness of foot    bilateral   Obesity    Tubular adenoma of colon    Vitamin D deficiency    :  Past Surgical History:  Procedure Laterality Date   CATARACT EXTRACTION W/PHACO Right 04/07/2019   Procedure: CATARACT EXTRACTION PHACO AND INTRAOCULAR LENS PLACEMENT ([place])  RIGHT DIABETIC;  Surgeon: Birder Robson, MD;  Location: [place];  Service: Ophthalmology;  Laterality: Right;  Diabetic - oral meds   COLONOSCOPY     COLONOSCOPY WITH PROPOFOL N/A 05/16/2015   Procedure: COLONOSCOPY WITH PROPOFOL;  Surgeon: Manya Silvas, MD;  Location: [facility] ENDOSCOPY;  Service: Endoscopy;  Laterality: N/A;   FEMUR IM NAIL Left 09/25/2020   Procedure: INTRAMEDULLARY (IM) NAIL HIP SCREW;  Surgeon: Meredith Pel, MD;  Location: [place];  Service: Orthopedics;  Laterality: Left;   ORIF FINGER / THUMB FRACTURE Left    thumb, 1970s   :  Current Facility-Administered Medications  Medication Dose Route Frequency Provider Last Rate Last Admin   0.9 %  sodium chloride infusion   Intravenous Continuous Elwyn Reach, MD 150 mL/hr at 12/02/20 0045 New Bag at 12/02/20 0045   albuterol (VENTOLIN HFA) 108 (90 Base) MCG/ACT inhaler 1 puff  1 puff Inhalation Q6H PRN Elwyn Reach, MD       atenolol (TENORMIN) tablet 50 mg  50 mg Oral Daily Elwyn Reach, MD       B-complex with vitamin C tablet   Oral Daily Elwyn Reach, MD  calcitonin (MIACALCIN) injection 100 Units  100 Units Intramuscular Daily Dahal, Marlowe Aschoff, MD       fenofibrate tablet 54 mg  54 mg Oral Daily Jonelle Sidle, Mohammad L, MD       furosemide (LASIX) injection 20 mg  20 mg Intravenous Daily Curcio, Roselie Awkward, NP       gabapentin (NEURONTIN) capsule 100 mg  100 mg Oral TID Elwyn Reach, MD   100 mg at 11/28/2020 2359   haloperidol lactate (HALDOL) injection 5 mg  5 mg Intramuscular Q6H PRN Elwyn Reach, MD   5 mg at 11/16/2020 2358   insulin aspart (novoLOG) injection 0-20 Units  0-20 Units  Subcutaneous TID WC Garba, Mohammad L, MD       insulin aspart (novoLOG) injection 0-5 Units  0-5 Units Subcutaneous QHS Jonelle Sidle, Mohammad L, MD       ondansetron (ZOFRAN) tablet 4 mg  4 mg Oral Q6H PRN Elwyn Reach, MD       Or   ondansetron (ZOFRAN) injection 4 mg  4 mg Intravenous Q6H PRN Elwyn Reach, MD       oxyCODONE (Oxy IR/ROXICODONE) immediate release tablet 5-10 mg  5-10 mg Oral Q4H PRN Elwyn Reach, MD       rosuvastatin (CRESTOR) tablet 5 mg  5 mg Oral Daily Elwyn Reach, MD       senna (SENOKOT) tablet 8.6 mg  1 tablet Oral QHS Gala Romney L, MD   8.6 mg at 12/02/2020 2357   umeclidinium-vilanterol (ANORO ELLIPTA) 62.5-25 MCG/INH 1 puff  1 puff Inhalation Daily Elwyn Reach, MD         Allergies  Allergen Reactions   Sulfa Antibiotics Rash   :  Family History  Problem Relation Age of Onset   Lung cancer Father    Prostate cancer Neg Hx    Bladder Cancer Neg Hx    Kidney cancer Neg Hx    :  Social History   Socioeconomic History   Marital status: Legally Separated    Spouse name: Not on file   Number of children: Not on file   Years of education: Not on file   Highest education level: Not on file  Occupational History   Not on file  Tobacco Use   Smoking status: Former Smoker    Quit date: 2000    Years since quitting: 22.0   Smokeless tobacco: Never Used  Scientific laboratory technician Use: Never used  Substance and Sexual Activity   Alcohol use: Yes    Alcohol/week: 14.0 standard drinks    Types: 14 Shots of liquor per week   Drug use: No   Sexual activity: Not on file  Other Topics Concern   Not on file  Social History Narrative   Not on file   Social Determinants of Health   Financial Resource Strain: Not on file  Food Insecurity: Not on file  Transportation Needs: Not on file  Physical Activity: Not on file  Stress: Not on file  Social Connections: Not on file  Intimate Partner Violence: Not on file  :  Review of  Systems: Unable to obtain  Exam: Patient Vitals for the past 24 hrs:  BP Temp Temp src Pulse Resp SpO2 Height Weight  12/02/20 0421 97/66 98.4 F (36.9 C) Oral 96 18 100 % 6' (1.829 m) 132.5 kg  12/02/20 0246 104/72 -- -- 91 18 96 % -- --  12/02/20 0049 132/79 98.3 F (36.8  C) Axillary 86 20 97 % -- --  12/02/20 0006 108/66 -- -- 93 20 97 % -- --  11/28/2020 2225 107/65 -- -- (!) 101 20 98 % -- 132.5 kg  11/12/2020 2153 106/68 -- -- 89 (!) 22 98 % -- --  11/09/2020 2000 112/68 -- -- -- 20 -- -- --  11/19/2020 1900 117/67 98.3 F (36.8 C) Oral 94 20 95 % -- --  11/18/2020 1900 117/67 -- -- 85 20 96 % -- --  11/20/2020 1821 (!) 91/57 -- -- 94 20 96 % -- --  11/07/2020 1745 96/61 -- -- 93 (!) 22 96 % -- --  11/07/2020 1722 104/61 98.3 F (36.8 C) Oral 91 (!) 34 98 % -- --    General: Chronically ill-appearing male, appears comfortable Eyes:  no scleral icterus.   ENT:  There were no oropharyngeal lesions.    Lymphatics: Palpable cervical lymphadenopathy present Respiratory: lungs were clear bilaterally without wheezing or crackles.   Cardiovascular:  Regular rate and rhythm, S1/S2, without murmur, rub or gallop.  2+ pedal edema bilaterally. GI:  abdomen was soft, flat, nontender, nondistended, without organomegaly.     Skin: He has a few ecchymoses on his arms.   Neuro: The patient is somnolent.  He wakes up briefly to answer yes/no questions.  He knew who his oncologist was in the name of his significant other.  He also seems to acknowledge that he has cancer and needs treatment.  Lab Results  Component Value Date   WBC 5.8 12/02/2020   HGB 11.9 (L) 12/02/2020   HCT 40.2 12/02/2020   PLT 257 12/02/2020   GLUCOSE 90 12/02/2020   ALT 14 12/02/2020   AST 16 12/02/2020   NA 140 12/02/2020   K 4.3 12/02/2020   CL 105 12/02/2020   CREATININE 1.69 (H) 12/02/2020   BUN 55 (H) 12/02/2020   CO2 23 12/02/2020    XR FEMUR MIN 2 VIEWS LEFT  Result Date: 11/16/2020 AP lateral left femur reviewed.   Intramedullary nail transfixes subtrochanteric femur fracture.  Not much callus formation present in the upper subtrochanteric region.  No lucencies or loosening of the hardware.  No screw breakage.  [place] EKG SITE RITE  Result Date: 12/02/2020 If Site Rite image not attached, placement could not be confirmed due to current cardiac rhythm.    XR FEMUR MIN 2 VIEWS LEFT  Result Date: 11/16/2020 AP lateral left femur reviewed.  Intramedullary nail transfixes subtrochanteric femur fracture.  Not much callus formation present in the upper subtrochanteric region.  No lucencies or loosening of the hardware.  No screw breakage.  [place] EKG SITE RITE  Result Date: 12/02/2020 If Site Rite image not attached, placement could not be confirmed due to current cardiac rhythm.   Pathology:  SURGICAL PATHOLOGY  CASE: [id number]  PATIENT: Andrew Dominguez  Surgical Pathology Report   Clinical History: left hip fracture (cm)   FINAL MICROSCOPIC DIAGNOSIS:   A. FEMORAL, LEFT PROXIMAL, RESECTION:  - Large B-cell lymphoma, see comment.   COMMENT:   Sections reveal sheets of lymphoid cells with areas of necrosis. There  is a mixture of small lymphocytes and larger lymphocytes with occasional  clusters of larger cells. Immunohistochemistry is positive for CD20,  CD79a, and bcl-2. Bcl-6 and MUM1 have weak staining. The lymphocytes are  negative for CD30, CD10, CD23 and EBV in situ hybridization. Ki-67 is  elevated. CD3 and CD5 highlight admixed small T-cells. Light chain in  situ hybridization is negative in  the lymphocytes. CD138 reveals only a  few background plasma cells. CD34, TdT, CD99, pancytokeratin, TTF-1, and  synaptophysin are negative. Overall, the findings are consistent with a  large B-cell lymphoma. FISH for MYC, BCL2, and BCL6 cannot be performed  as the tissue was decalcified. Dr. Mike Gip was notified on 10/03/2020.   Assessment and Plan:  This is a 65 year old male diagnosed with large  B-cell lymphoma in November 2021.  He has been residing in a SNF and has been unable to get to his primary oncologist to continue work-up and begin treatment.  He is now admitted with severe hypercalcemia.  Hypercalcemia -Recommend continuation of IV fluid of normal saline at 100 cc/h. -Continue Lasix 20 mg daily.  I have changed from p.o. to IV. -Status post Zometa 12/02/2020. -Continue calcitonin. -Monitor calcium level very closely.  Large B-cell lymphoma -Based on prior CT scans from late October 2021, the patient has widespread disease. -Biopsy from one of his bone lesions consistent with large B-cell lymphoma. -We will attempt to obtain a liver biopsy to confirm that lesions in the liver are consistent with B-cell lymphoma.  I have placed the order and have stopped his Eliquis anticipation of biopsy. -Spoke with significant other by telephone and discussed his current condition.  We discussed that hypercalcemia is due to his underlying malignancy which has not been treated.  Recommend that he begin treatment with systemic chemotherapy here in the hospital.  Recommend R-CHOP, but will hold off on rituximab until early next week.  Anticipate giving CHOP 12/03/2020.  Discussed adverse effects of this treatment with his significant other including but not limited to alopecia, myelosuppression, fatigue, nausea, vomiting, diarrhea, cardiotoxicity, peripheral neuropathy, and possible infusion reaction related to rituximab the patient's significant other has given consent to proceed. She understands that some of these side effects can be fatal and permanent -The patient will need a PICC line placed for chemotherapy administration.  His significant other also consents to PICC line placement. -We will obtain a baseline echocardiogram as well as lab work including acute hepatitis panel, uric acid, LDH. Reviewed EF of 70% satisfactory to proceed with treatment. - Uric acid 20.5, ordered rasburicase today,  will repeat labs this evening. -Begin Solu-Medrol 80 mg daily today.  Begin Protonix 40 mg daily.  Thank you for this referral.   Mikey Bussing, DNP, AGPCNP-BC, AOCNP  Attending Note  I personally saw the patient, reviewed the chart and examined the patient. The plan of care was discussed with the patient and the admitting team. I agree with the assessment and plan as documented above. Thank you very much for the consultation. Unfortunately he was admitted with severe hypercalcemia of malignancy, previously diagnosed with B cell lymphoma, didn't receive any treatment. Discussed with his significant other who is the Health care power of attorney, she consented for inpatient chemotherapy Discussed all the adverse effects as mentioned above. She is agreeable to do everything we can to save him if possible She understands that there can be fatal complications from treatment. Chemotherapy plan in place. Please consider CBC, CMP, LDH, uric acid, phosphorus daily. CHOP tomorrow, ritux likely Monday, awaiting hepatitis panel.  Lynnox Girten M.D.

## 2020-12-02 NOTE — ED Notes (Signed)
Patient has removed all 3 IV's and monitor. Mittens placed on both hands prior to leaving the room after last IV placements. Patient was able to remove the mittens and IVs. Patient is being transferred to [facility] for inpatient care.

## 2020-12-02 NOTE — Progress Notes (Signed)
Per Dr. Chryl Heck, can D/C Dex IV premed since pt has duplicate steroid order (Solumedrol).  Kennith Center, Pharm.D., CPP 12/02/2020@4 :00 PM

## 2020-12-02 NOTE — Progress Notes (Signed)
*  PRELIMINARY RESULTS* Echocardiogram 2D Echocardiogram with definity has been performed.  Andrew Dominguez 12/02/2020, 1:25 PM

## 2020-12-02 NOTE — Progress Notes (Signed)
Peripherally Inserted Central Catheter Placement  The IV Nurse has discussed with the patient and/or persons authorized to consent for the patient, the purpose of this procedure and the potential benefits and risks involved with this procedure.  The benefits include less needle sticks, lab draws from the catheter, and the patient may be discharged home with the catheter. Risks include, but not limited to, infection, bleeding, blood clot (thrombus formation), and puncture of an artery; nerve damage and irregular heartbeat and possibility to perform a PICC exchange if needed/ordered by physician.  Alternatives to this procedure were also discussed.  Bard Power PICC patient education guide, fact sheet on infection prevention and patient information card has been provided to patient /or left at bedside.    PICC Placement Documentation  PICC Double Lumen 15/72/62 PICC Right Basilic 42 cm 0 cm (Active)  Indication for Insertion or Continuance of Line Vasoactive infusions;Prolonged intravenous therapies 12/02/20 1647  Exposed Catheter (cm) 0 cm 12/02/20 1647  Site Assessment Clean;Dry;Intact 12/02/20 1647  Lumen #1 Status Flushed;Saline locked;Blood return noted 12/02/20 1647  Lumen #2 Status Flushed;Saline locked;Blood return noted 12/02/20 1647  Dressing Type Transparent;Securing device 12/02/20 1647  Dressing Status Clean;Dry;Intact 12/02/20 1647  Antimicrobial disc in place? Yes 12/02/20 1647  Safety Lock Not Applicable 03/55/97 4163  Dressing Intervention New dressing 12/02/20 8453  Dressing Change Due 12/09/20 12/02/20 1647       Enos Fling 12/02/2020, 4:50 PM

## 2020-12-02 NOTE — Progress Notes (Signed)
NUTRITION NOTE  Tech alerted intern that pt is now more alert and conversive. Intern went back into pt's room and asked further questions.   Pt discussed with intern that he has had a poor appetite and has not been eating well since his hospital stay. Pt told intern that PTA he did not had a good appetite for the past few weeks and was taking a few bites of the food that was prepared for him at his SNF. Pt discussed with intern that he likes to eat meatloaf; rice and beans; fried squash and/or fried okra.  Pt denied taking any ONS at home, but was familiar with the supplement Glucerna. Pt was willing to take Glucerna while in the hospital to aid in his healing process.   Pt denied any nausea, vomiting, diarrhea or abdominal pain. Pt denied any chewing or swallowing difficulties. Pt also denied any taste changes.  Pt indicated to intern that he didn't know his UBW, however he felt that he had lost weight in the past 3-4 weeks.   Pt mentioned to intern that he has not been mobile since his hip surgery in Nov. 2021 d/t not putting weight on his leg.   Andrew Dominguez, Dietetic Intern 12/02/2020 12:21 PM

## 2020-12-02 NOTE — Progress Notes (Signed)
Chaplain engaged in initial visit with Andrew Dominguez and his significant other.  She had questions around getting a will notarized concerning possessions and finances.  Chaplain let her know to contact [facility] tomorrow when she arrives and when she has the paperwork with her.  Chaplain did let her know that because the will is not healthcare related, the [facility] may not notarize it.  Chaplain also let her know exceptions are sometimes made based on the need or severity of the situation.  She conveyed that [name] has stage 4 cancer and wants to get this done as soon as possible.  When chaplain asked if he wanted to get this done, he conveyed, "Yes." Chaplain also relayed to nurse Ashleigh that any notary in the hospital can complete the paperwork and not just the [facility].  Chaplain let significant other know that if no one who works in the hospital wants to notarize paperwork she will have to get permission from staff or the director to have her own notary come in.    Chaplain offered listening and presence.  Chaplain will follow-up as needed.      12/02/20 1400  Clinical Encounter Type  Visited With Patient and family together  Visit Type Initial

## 2020-12-02 NOTE — Progress Notes (Signed)
Ok to proceed w/ chemo on 12/03/20 SCr = 1.69 on 12/02/20. CrCl = 82 mL/min.  Dose adj not necessary based on today's labs. I have d/w Dr. Chryl Heck.  Kennith Center, Pharm.D., CPP 12/02/2020@3 :29 PM

## 2020-12-03 LAB — GLUCOSE, CAPILLARY
Glucose-Capillary: 105 mg/dL — ABNORMAL HIGH (ref 70–99)
Glucose-Capillary: 150 mg/dL — ABNORMAL HIGH (ref 70–99)
Glucose-Capillary: 114 mg/dL — ABNORMAL HIGH (ref 70–99)
Glucose-Capillary: 158 mg/dL — ABNORMAL HIGH (ref 70–99)

## 2020-12-03 LAB — COMPREHENSIVE METABOLIC PANEL
ALT: 15 U/L (ref 0–44)
AST: 16 U/L (ref 15–41)
Albumin: 3 g/dL — ABNORMAL LOW (ref 3.5–5.0)
Alkaline Phosphatase: 59 U/L (ref 38–126)
Anion gap: 12 (ref 5–15)
BUN: 48 mg/dL — ABNORMAL HIGH (ref 8–23)
CO2: 22 mmol/L (ref 22–32)
Calcium: 12.3 mg/dL — ABNORMAL HIGH (ref 8.9–10.3)
Chloride: 111 mmol/L (ref 98–111)
Creatinine, Ser: 1.32 mg/dL — ABNORMAL HIGH (ref 0.61–1.24)
GFR, Estimated: >60 mL/min (ref >60)
Glucose, Bld: 123 mg/dL — ABNORMAL HIGH (ref 70–99)
Potassium: 4 mmol/L (ref 3.5–5.1)
Sodium: 145 mmol/L (ref 135–145)
Total Bilirubin: 0.8 mg/dL (ref 0.3–1.2)
Total Protein: 5.5 g/dL — ABNORMAL LOW (ref 6.5–8.1)

## 2020-12-03 LAB — CBC WITH DIFFERENTIAL/PLATELET
Abs Immature Granulocytes: 0.03 10*3/uL (ref 0.00–0.07)
Basophils Absolute: 0 10*3/uL (ref 0.0–0.1)
Basophils Relative: 0 %
Eosinophils Absolute: 0 10*3/uL (ref 0.0–0.5)
Eosinophils Relative: 0 %
HCT: 39.2 % (ref 39.0–52.0)
Hemoglobin: 11.5 g/dL — ABNORMAL LOW (ref 13.0–17.0)
Immature Granulocytes: 1 %
Lymphocytes Relative: 13 %
Lymphs Abs: 0.8 10*3/uL (ref 0.7–4.0)
MCH: 23.9 pg — ABNORMAL LOW (ref 26.0–34.0)
MCHC: 29.3 g/dL — ABNORMAL LOW (ref 30.0–36.0)
MCV: 81.5 fL (ref 80.0–100.0)
Monocytes Absolute: 0.5 10*3/uL (ref 0.1–1.0)
Monocytes Relative: 8 %
Neutro Abs: 4.6 10*3/uL (ref 1.7–7.7)
Neutrophils Relative %: 78 %
Platelets: 286 10*3/uL (ref 150–400)
RBC: 4.81 MIL/uL (ref 4.22–5.81)
RDW: 17.2 % — ABNORMAL HIGH (ref 11.5–15.5)
WBC: 5.9 10*3/uL (ref 4.0–10.5)
nRBC: 0 % (ref 0.0–0.2)

## 2020-12-03 LAB — MAGNESIUM: Magnesium: 1.7 mg/dL (ref 1.7–2.4)

## 2020-12-03 LAB — RASBURICASE - URIC ACID: Uric Acid, Serum: 13.5 mg/dL — ABNORMAL HIGH (ref 3.7–8.6)

## 2020-12-03 LAB — PHOSPHORUS: Phosphorus: 3.2 mg/dL (ref 2.5–4.6)

## 2020-12-03 LAB — LACTATE DEHYDROGENASE: LDH: 308 U/L — ABNORMAL HIGH (ref 98–192)

## 2020-12-03 MED ORDER — DIPHENHYDRAMINE HCL 50 MG PO CAPS
50.0000 mg | ORAL_CAPSULE | Freq: Once | ORAL | Status: AC
  Administered 2020-12-04: 50 mg via ORAL
  Filled 2020-12-03: qty 1

## 2020-12-03 MED ORDER — TBO-FILGRASTIM 480 MCG/0.8ML ~~LOC~~ SOSY: 480.0000 ug | PREFILLED_SYRINGE | Freq: Every day | SUBCUTANEOUS | Status: DC

## 2020-12-03 MED ORDER — TBO-FILGRASTIM 480 MCG/0.8ML ~~LOC~~ SOSY
480.0000 ug | PREFILLED_SYRINGE | Freq: Every day | SUBCUTANEOUS | Status: DC
  Administered 2020-12-04: 480 ug via SUBCUTANEOUS
  Filled 2020-12-03 (×2): qty 0.8

## 2020-12-03 MED ORDER — SODIUM CHLORIDE 0.9 % IV SOLN
6.0000 mg | Freq: Once | INTRAVENOUS | Status: AC
  Administered 2020-12-03: 6 mg via INTRAVENOUS
  Filled 2020-12-03: qty 4

## 2020-12-03 MED ORDER — SODIUM CHLORIDE 0.9 % IV SOLN
375.0000 mg/m2 | Freq: Once | INTRAVENOUS | Status: AC
  Administered 2020-12-04: 1000 mg via INTRAVENOUS
  Filled 2020-12-03: qty 50

## 2020-12-03 MED ORDER — SODIUM CHLORIDE 0.9 % IV SOLN: Freq: Once | INTRAVENOUS | Status: DC

## 2020-12-03 MED ORDER — ENOXAPARIN SODIUM 150 MG/ML ~~LOC~~ SOLN
130.0000 mg | Freq: Two times a day (BID) | SUBCUTANEOUS | Status: DC
  Administered 2020-12-03 – 2020-12-04 (×3): 130 mg via SUBCUTANEOUS
  Filled 2020-12-03 (×4): qty 0.86

## 2020-12-03 MED ORDER — ACETAMINOPHEN 325 MG PO TABS
650.0000 mg | ORAL_TABLET | Freq: Once | ORAL | Status: AC
  Administered 2020-12-04: 650 mg via ORAL
  Filled 2020-12-03: qty 2

## 2020-12-03 MED ORDER — ENOXAPARIN SODIUM 80 MG/0.8ML ~~LOC~~ SOLN: 70.0000 mg | Freq: Every day | SUBCUTANEOUS | Status: DC

## 2020-12-03 NOTE — Progress Notes (Signed)
ANTICOAGULATION CONSULT NOTE - Initial Consult  Pharmacy Consult for Lovenox Indication: atrial fibrillation  Allergies  Allergen Reactions  . Sulfa Antibiotics Rash    Patient Measurements: Height: 6' (182.9 cm) Weight: 132.5 kg (292 lb 1.8 oz) IBW/kg (Calculated) : 77.6  Vital Signs: Temp: 97.4 F (36.3 C) (01/29 0621) Temp Source: Oral (01/29 0621) BP: 122/65 (01/29 0621) Pulse Rate: 91 (01/29 0621)  Labs: Recent Labs    11/06/2020 1801 12/02/20 0220 12/03/20 0700  HGB 12.3* 11.9* 11.5*  HCT 40.7 40.2 39.2  PLT 325 257 286  CREATININE 1.91* 1.69* 1.32*    Estimated Creatinine Clearance: 79.6 mL/min (A) (by C-G formula based on SCr of 1.32 mg/dL (H)).   Medical History: Past Medical History:  Diagnosis Date  . Anemia   . Arthritis    knees  . COPD (chronic obstructive pulmonary disease) ([place])   . Diabetes mellitus without complication ([place])   . Hyperlipemia   . Hypertension   . Hypertriglyceridemia   . Hypogonadism in male   . Numbness of foot    bilateral  . Obesity   . Tubular adenoma of colon   . Vitamin D deficiency     Medications:  On Eliquis 5 mg po BID prior to admission.  Assessment: Pharmacy consulted to dose Lovenox for atrial fibrillation while Eliquis on hold in preparation for liver biopsy.  BMI > 30 and estimated creatinine clearance > 30 ml/min.    Goal of Therapy:  Anti-Xa level 0.6-1 units/ml 4hrs after LMWH dose given Monitor platelets by anticoagulation protocol: Yes   Plan:  Lovenox 130 mg (1 mg/kg) subq every 12 hours Monitor CBC, s/s bleeding   Efraim Kaufmann, PharmD, BCPS 12/03/2020,12:17 PM

## 2020-12-03 NOTE — Progress Notes (Signed)
Andrew Dominguez   DOB:08-28-1956   XI#:[id number]   JQB#:[id number]  Subjective:  He is awake, able to converse, feels so much better he says. He denies any pain for me. He ate his breakfast this morning. No fevers or diarrhea. He says " I have to try this treatment"    Objective:  Vitals:   12/03/20 0621 12/03/20 1457  BP: 122/65 (!) 98/58  Pulse: 91 87  Resp: 16 16  Temp: (!) 97.4 F (36.3 C) 97.9 F (36.6 C)  SpO2: 96% 93%    Body mass index is 39.62 kg/m.  Intake/Output Summary (Last 24 hours) at 12/03/2020 1530 Last data filed at 12/03/2020 1313 Gross per 24 hour  Intake 240 ml  Output --  Net 240 ml     Sclerae unicteric             Palpable cervical lymphadenopathy              Lungs clear -- no rales or rhonchi  Heart regular rate and rhythm  Abdomen benign  MSK no focal spinal tenderness, no peripheral edema  Neuro nonfocal  CBG (last 3)  Recent Labs    12/02/20 2126 12/03/20 0746 12/03/20 1205  GLUCAP 116* 105* 150*     Labs:  Lab Results  Component Value Date   WBC 5.9 12/03/2020   HGB 11.5 (L) 12/03/2020   HCT 39.2 12/03/2020   MCV 81.5 12/03/2020   PLT 286 12/03/2020   NEUTROABS 4.6 12/03/2020    Urine Studies No results for input(s): UHGB, CRYS in the last 72 hours.  Invalid input(s): UACOL, UAPR, USPG, UPH, UTP, UGL, UKET, UBIL, UNIT, UROB, ULEU, UEPI, UWBC, URBC, UBAC, CAST, UCOM, [place]  Basic Metabolic Panel: Recent Labs  Lab 11/28/2020 1801 12/02/20 0220 12/03/20 0700  NA 139 140 145  K 4.5 4.3 4.0  CL 104 105 111  CO2 22 23 22   GLUCOSE 93 90 123*  BUN 59* 55* 48*  CREATININE 1.91* 1.69* 1.32*  CALCIUM 14.7* 14.1* 12.3*  MG  --   --  1.7  PHOS 3.7  --  3.2   GFR Estimated Creatinine Clearance: 79.6 mL/min (A) (by C-G formula based on SCr of 1.32 mg/dL (H)). Liver Function Tests: Recent Labs  Lab 11/26/2020 1801 12/02/20 0220 12/03/20 0700  AST 17 16 16   ALT 14 14 15   ALKPHOS 72 70 59  BILITOT 0.9 0.9 0.8  PROT  5.7* 5.6* 5.5*  ALBUMIN 2.9* 2.9* 3.0*   No results for input(s): LIPASE, AMYLASE in the last 168 hours. No results for input(s): AMMONIA in the last 168 hours. Coagulation profile No results for input(s): INR, PROTIME in the last 168 hours.  CBC: Recent Labs  Lab 11/05/2020 1801 12/02/20 0220 12/03/20 0700  WBC 7.2 5.8 5.9  NEUTROABS  --   --  4.6  HGB 12.3* 11.9* 11.5*  HCT 40.7 40.2 39.2  MCV 79.5* 80.1 81.5  PLT 325 257 286   Cardiac Enzymes: No results for input(s): CKTOTAL, CKMB, CKMBINDEX, TROPONINI in the last 168 hours. BNP: Invalid input(s): POCBNP CBG: Recent Labs  Lab 12/02/20 1151 12/02/20 1713 12/02/20 2126 12/03/20 0746 12/03/20 1205  GLUCAP 88 121* 116* 105* 150*   D-Dimer No results for input(s): DDIMER in the last 72 hours. Hgb A1c Recent Labs    12/02/20 0220  HGBA1C 5.0   Lipid Profile No results for input(s): CHOL, HDL, LDLCALC, TRIG, CHOLHDL, LDLDIRECT in the last 72 hours. Thyroid function studies No  results for input(s): TSH, T4TOTAL, T3FREE, THYROIDAB in the last 72 hours.  Invalid input(s): FREET3 Anemia work up No results for input(s): VITAMINB12, FOLATE, FERRITIN, TIBC, IRON, RETICCTPCT in the last 72 hours. Microbiology Recent Results (from the past 240 hour(s))  SARS CORONAVIRUS 2 (TAT 6-24 HRS) Nasopharyngeal Nasopharyngeal Swab     Status: None   Collection Time: 11/19/2020  6:01 PM   Specimen: Nasopharyngeal Swab  Result Value Ref Range Status   SARS Coronavirus 2 NEGATIVE NEGATIVE Final    Comment: (NOTE) SARS-CoV-2 target nucleic acids are NOT DETECTED.  The SARS-CoV-2 RNA is generally detectable in upper and lower respiratory specimens during the acute phase of infection. Negative results do not preclude SARS-CoV-2 infection, do not rule out co-infections with other pathogens, and should not be used as the sole basis for treatment or other patient management decisions. Negative results must be combined with clinical  observations, patient history, and epidemiological information. The expected result is Negative.  Fact Sheet for Patients: SugarRoll.be  Fact Sheet for Healthcare Providers: https://www.woods-mathews.com/  This test is not yet approved or cleared by the [place] FDA and  has been authorized for detection and/or diagnosis of SARS-CoV-2 by FDA under an Emergency Use Authorization (EUA). This EUA will remain  in effect (meaning this test can be used) for the duration of the COVID-19 declaration under Se ction 564(b)(1) of the Act, 21 U.S.C. section 360bbb-3(b)(1), unless the authorization is terminated or revoked sooner.  Performed at [facility], [place] [address], [place], [place] [address]   SARS Coronavirus 2 by RT PCR (hospital order, performed in [facility] hospital lab) Nasopharyngeal Nasopharyngeal Swab     Status: None   Collection Time: 12/02/20  1:49 AM   Specimen: Nasopharyngeal Swab  Result Value Ref Range Status   SARS Coronavirus 2 NEGATIVE NEGATIVE Final    Comment: (NOTE) SARS-CoV-2 target nucleic acids are NOT DETECTED.  The SARS-CoV-2 RNA is generally detectable in upper and lower respiratory specimens during the acute phase of infection. The lowest concentration of SARS-CoV-2 viral copies this assay can detect is 250 copies / mL. A negative result does not preclude SARS-CoV-2 infection and should not be used as the sole basis for treatment or other patient management decisions.  A negative result may occur with improper specimen collection / handling, submission of specimen other than nasopharyngeal swab, presence of viral mutation(s) within the areas targeted by this assay, and inadequate number of viral copies (<250 copies / mL). A negative result must be combined with clinical observations, patient history, and epidemiological information.  Fact Sheet for Patients:    StrictlyIdeas.no  Fact Sheet for Healthcare Providers: BankingDealers.co.za  This test is not yet approved or  cleared by the [place] FDA and has been authorized for detection and/or diagnosis of SARS-CoV-2 by FDA under an Emergency Use Authorization (EUA).  This EUA will remain in effect (meaning this test can be used) for the duration of the COVID-19 declaration under Section 564(b)(1) of the Act, 21 U.S.C. section 360bbb-3(b)(1), unless the authorization is terminated or revoked sooner.  Performed at [facility], [place] [address], [place], [place] [address]       Studies:  CT CHEST W CONTRAST  Result Date: 12/02/2020 CLINICAL DATA:  Diffuse large B-cell lymphoma restaging EXAM: CT CHEST, ABDOMEN, AND PELVIS WITH CONTRAST TECHNIQUE: Multidetector CT imaging of the chest, abdomen and pelvis was performed following the standard protocol during bolus administration of intravenous contrast. CONTRAST:  94mL  OMNIPAQUE IOHEXOL 300 MG/ML  SOLN COMPARISON:  09/01/2020 FINDINGS: CT CHEST FINDINGS Cardiovascular: Right PICC line tip: SVC Coronary, aortic arch, and branch vessel atherosclerotic vascular disease. Mediastinum/Nodes: Distal paraesophageal lymph node 1.2 cm in short axis on image 45 series 2, previously 0.5 cm. Pericardial lymph node 1.3 cm in short axis on image 43 series 2, previously not well seen. Lungs/Pleura: Large right and small left pleural effusions with passive atelectasis. Progressive scattered primarily sub solid pulmonary nodules are observed. For example, a left apical nodule is now mostly solid, with a hazy rim of sub solid opacity, measuring 3.0 by 2 point 3 cm on image 18 of series 4, previously completely sub solid measuring 1.3 by 1.0 cm. Some of the scattered nodules are similar to prior and others likewise demonstrate enlargement including the sub solid 2.9 by 2.3 cm right upper lobe nodule on  image 44 of series 4 which was previously 1.3 by 0.9 cm. Pleural-based nodularity along the left hemidiaphragm is observed. Musculoskeletal: Thoracic spondylosis. CT ABDOMEN PELVIS FINDINGS Hepatobiliary: Hepatomegaly with widespread masses within along the liver and possibly infiltrative component. Lateral segment left hepatic lobe hypodensity currently 15.7 by 11.1 cm, formerly 6.3 by 5.4 cm. Lesion posteriorly in the right hepatic lobe measuring 10.1 by 8.1 cm, formerly 7.0 by 6.2 cm. Multiple new hypodense lesions are present in the liver. Small gallstones are present in the gallbladder. Pancreas: Unremarkable Spleen: Large hypodense mass of the upper spleen measuring 11.9 by 11.3 cm, formerly 12.5 by 10.0 cm, roughly stable. However, there is new nodularity tracking along the inferomedial spleen margin including a 2.5 by 1.9 cm lesion on image 72 of series 2 which previously measured 1.1 cm in diameter. Adrenals/Urinary Tract: Small hypodense mass of the lateral limb left adrenal gland is probably a myelolipoma. This appears unchanged. Hypodense exophytic 1.9 by 1.7 cm left kidney upper pole cyst noted. Urinary bladder unremarkable. Stomach/Bowel: There are some scattered diverticula of the descending and sigmoid colon. Vascular/Lymphatic: Aortoiliac atherosclerotic vascular disease. Retrocrural node 1.3 cm in short axis on image 60 series 2, formerly 0.6 cm. Conglomerate adenopathy near the left adrenal gland measures about 3.9 by 5.3 cm on image 62 of series 2, formerly measured at 5.1 by 3.1 cm. Reproductive: Unremarkable Other: Mild perihepatic ascites. Trace edema along the omentum. Small amount of pelvic ascites. Subcutaneous edema along the flanks. Musculoskeletal: The lytic lesion of the left posterior iliac bone is substantially increased in size, with large extraosseous component, measuring 11.7 by 8.2 cm on image 93 of series 2 (formerly 4.5 by 3.1 cm). Large lytic destructive mass of the left  proximal femur noted in the vicinity of an IM rod, the mass now has a large extraosseous component and on the lower most image measures about 12.0 by 9.5 cm, formerly 3.3 by 3.2 cm. There is also a lytic lesion in the right proximal femur starting to erode the cortex, measuring 3.3 by 2.4 cm, previously 2.8 by 1.9 cm. New mass in the left gluteus maximus muscle, 5.8 by 3.7 cm. Lower lumbar spondylosis and degenerative disc disease. IMPRESSION: 1. Overall significant progression of malignancy. There is enlargement of the widespread hepatic masses; new and enlarging sub solid pulmonary nodules; new and enlarging thoracic and abdominal adenopathy; enlarging lytic destructive lesions of the proximal femurs and left iliac bone; new mass in the left gluteus maximus muscle; and similar large mass in the spleen but with some new soft tissue nodules along the medial spleen. 2. Large right and  small left pleural effusions with passive atelectasis. 3. Other imaging findings of potential clinical significance: Coronary, aortic arch, and branch vessel atherosclerotic vascular disease. Cholelithiasis. Mild perihepatic and pelvic ascites. Subcutaneous edema along the flanks. Small left adrenal myelolipoma. Small amount of pelvic ascites. 4. Aortic atherosclerosis. Aortic Atherosclerosis (ICD10-I70.0). Electronically Signed   By: Van Clines M.D.   On: 12/02/2020 21:00   CT ABDOMEN PELVIS W CONTRAST  Result Date: 12/02/2020 CLINICAL DATA:  Diffuse large B-cell lymphoma restaging EXAM: CT CHEST, ABDOMEN, AND PELVIS WITH CONTRAST TECHNIQUE: Multidetector CT imaging of the chest, abdomen and pelvis was performed following the standard protocol during bolus administration of intravenous contrast. CONTRAST:  20mL OMNIPAQUE IOHEXOL 300 MG/ML  SOLN COMPARISON:  09/01/2020 FINDINGS: CT CHEST FINDINGS Cardiovascular: Right PICC line tip: SVC Coronary, aortic arch, and branch vessel atherosclerotic vascular disease.  Mediastinum/Nodes: Distal paraesophageal lymph node 1.2 cm in short axis on image 45 series 2, previously 0.5 cm. Pericardial lymph node 1.3 cm in short axis on image 43 series 2, previously not well seen. Lungs/Pleura: Large right and small left pleural effusions with passive atelectasis. Progressive scattered primarily sub solid pulmonary nodules are observed. For example, a left apical nodule is now mostly solid, with a hazy rim of sub solid opacity, measuring 3.0 by 2 point 3 cm on image 18 of series 4, previously completely sub solid measuring 1.3 by 1.0 cm. Some of the scattered nodules are similar to prior and others likewise demonstrate enlargement including the sub solid 2.9 by 2.3 cm right upper lobe nodule on image 44 of series 4 which was previously 1.3 by 0.9 cm. Pleural-based nodularity along the left hemidiaphragm is observed. Musculoskeletal: Thoracic spondylosis. CT ABDOMEN PELVIS FINDINGS Hepatobiliary: Hepatomegaly with widespread masses within along the liver and possibly infiltrative component. Lateral segment left hepatic lobe hypodensity currently 15.7 by 11.1 cm, formerly 6.3 by 5.4 cm. Lesion posteriorly in the right hepatic lobe measuring 10.1 by 8.1 cm, formerly 7.0 by 6.2 cm. Multiple new hypodense lesions are present in the liver. Small gallstones are present in the gallbladder. Pancreas: Unremarkable Spleen: Large hypodense mass of the upper spleen measuring 11.9 by 11.3 cm, formerly 12.5 by 10.0 cm, roughly stable. However, there is new nodularity tracking along the inferomedial spleen margin including a 2.5 by 1.9 cm lesion on image 72 of series 2 which previously measured 1.1 cm in diameter. Adrenals/Urinary Tract: Small hypodense mass of the lateral limb left adrenal gland is probably a myelolipoma. This appears unchanged. Hypodense exophytic 1.9 by 1.7 cm left kidney upper pole cyst noted. Urinary bladder unremarkable. Stomach/Bowel: There are some scattered diverticula of the  descending and sigmoid colon. Vascular/Lymphatic: Aortoiliac atherosclerotic vascular disease. Retrocrural node 1.3 cm in short axis on image 60 series 2, formerly 0.6 cm. Conglomerate adenopathy near the left adrenal gland measures about 3.9 by 5.3 cm on image 62 of series 2, formerly measured at 5.1 by 3.1 cm. Reproductive: Unremarkable Other: Mild perihepatic ascites. Trace edema along the omentum. Small amount of pelvic ascites. Subcutaneous edema along the flanks. Musculoskeletal: The lytic lesion of the left posterior iliac bone is substantially increased in size, with large extraosseous component, measuring 11.7 by 8.2 cm on image 93 of series 2 (formerly 4.5 by 3.1 cm). Large lytic destructive mass of the left proximal femur noted in the vicinity of an IM rod, the mass now has a large extraosseous component and on the lower most image measures about 12.0 by 9.5 cm, formerly 3.3 by 3.2 cm.  There is also a lytic lesion in the right proximal femur starting to erode the cortex, measuring 3.3 by 2.4 cm, previously 2.8 by 1.9 cm. New mass in the left gluteus maximus muscle, 5.8 by 3.7 cm. Lower lumbar spondylosis and degenerative disc disease. IMPRESSION: 1. Overall significant progression of malignancy. There is enlargement of the widespread hepatic masses; new and enlarging sub solid pulmonary nodules; new and enlarging thoracic and abdominal adenopathy; enlarging lytic destructive lesions of the proximal femurs and left iliac bone; new mass in the left gluteus maximus muscle; and similar large mass in the spleen but with some new soft tissue nodules along the medial spleen. 2. Large right and small left pleural effusions with passive atelectasis. 3. Other imaging findings of potential clinical significance: Coronary, aortic arch, and branch vessel atherosclerotic vascular disease. Cholelithiasis. Mild perihepatic and pelvic ascites. Subcutaneous edema along the flanks. Small left adrenal myelolipoma. Small  amount of pelvic ascites. 4. Aortic atherosclerosis. Aortic Atherosclerosis (ICD10-I70.0). Electronically Signed   By: Van Clines M.D.   On: 12/02/2020 21:00   DG CHEST PORT 1 VIEW  Result Date: 12/02/2020 CLINICAL DATA:  Right PICC placement. EXAM: PORTABLE CHEST 1 VIEW COMPARISON:  Most recent radiograph 09/27/2020, CT 09/01/2020 FINDINGS: Right upper extremity PICC tip projects over the upper SVC. Layering density in the right hemithorax consistent with pleural effusion. Mild volume loss in the right hemithorax suggest underlying atelectasis. There is no pneumothorax. Minor atelectasis at the left lung base. The heart is normal in size. No large left pleural effusion. IMPRESSION: 1. Right upper extremity PICC tip projects over the upper SVC. 2. Layering right pleural effusion, at least moderate in size, with likely associated atelectasis. Electronically Signed   By: Keith Rake M.D.   On: 12/02/2020 17:17   ECHOCARDIOGRAM COMPLETE  Result Date: 12/02/2020    ECHOCARDIOGRAM REPORT   Patient Name:   AMMAR MOFFATT Date of Exam: 12/02/2020 Medical Rec #:  [id number]          Height:       72.0 in Accession #:    [id number]         Weight:       292.1 lb Date of Birth:  07-Jan-1956          BSA:          2.503 m Patient Age:    43 years           BP:           97/66 mmHg Patient Gender: M                  HR:           96 bpm. Exam Location:  Inpatient Procedure: 2D Echo and Intracardiac Opacification Agent Indications:    Chemo Z09  History:        Patient has no prior history of Echocardiogram examinations.                 Arrythmias:Atrial Fibrillation, Signs/Symptoms:Fever; Risk                 Factors:Former Smoker, Diabetes, Dyslipidemia and Hypertension.  Sonographer:    Leavy Cella Referring Phys: [place]  Sonographer Comments: Technically difficult study due to poor echo windows. IMPRESSIONS  1. Left ventricular ejection fraction, by estimation, is 65 to 70%. The left  ventricle has normal function. The left ventricle has no regional wall motion abnormalities. There is mild left ventricular hypertrophy.  Left ventricular diastolic parameters were normal.  2. Right ventricular systolic function is normal. The right ventricular size is normal. There is normal pulmonary artery systolic pressure.  3. The mitral valve is grossly normal. No evidence of mitral valve regurgitation.  4. The aortic valve is tricuspid. Aortic valve regurgitation is not visualized.  5. The inferior vena cava is normal in size with greater than 50% respiratory variability, suggesting right atrial pressure of 3 mmHg. Comparison(s): No prior Echocardiogram. FINDINGS  Left Ventricle: Left ventricular ejection fraction, by estimation, is 65 to 70%. The left ventricle has normal function. The left ventricle has no regional wall motion abnormalities. Definity contrast agent was given IV to delineate the left ventricular  endocardial borders. The left ventricular internal cavity size was normal in size. There is mild left ventricular hypertrophy. Left ventricular diastolic parameters were normal. Right Ventricle: The right ventricular size is normal. No increase in right ventricular wall thickness. Right ventricular systolic function is normal. There is normal pulmonary artery systolic pressure. The tricuspid regurgitant velocity is 2.71 m/s, and  with an assumed right atrial pressure of 3 mmHg, the estimated right ventricular systolic pressure is 32.2 mmHg. Left Atrium: Left atrial size was normal in size. Right Atrium: Right atrial size was normal in size. Pericardium: There is no evidence of pericardial effusion. Mitral Valve: The mitral valve is grossly normal. No evidence of mitral valve regurgitation. Tricuspid Valve: The tricuspid valve is grossly normal. Tricuspid valve regurgitation is trivial. Aortic Valve: The aortic valve is tricuspid. Aortic valve regurgitation is not visualized. Pulmonic Valve: The  pulmonic valve was normal in structure. Pulmonic valve regurgitation is not visualized. Aorta: The aortic root and ascending aorta are structurally normal, with no evidence of dilitation. Venous: The inferior vena cava is normal in size with greater than 50% respiratory variability, suggesting right atrial pressure of 3 mmHg. IAS/Shunts: No atrial level shunt detected by color flow Doppler.  LEFT VENTRICLE PLAX 2D LVIDd:         4.40 cm  Diastology LVIDs:         2.60 cm  LV e' medial:    11.60 cm/s LV PW:         1.40 cm  LV E/e' medial:  5.0 LV IVS:        1.10 cm  LV e' lateral:   19.00 cm/s LVOT diam:     2.10 cm  LV E/e' lateral: 3.0 LVOT Area:     3.46 cm  RIGHT VENTRICLE RV S prime:     25.80 cm/s TAPSE (M-mode): 1.8 cm LEFT ATRIUM             Index       RIGHT ATRIUM           Index LA diam:        2.60 cm 1.04 cm/m  RA Area:     13.60 cm LA Vol (A2C):   28.6 ml 11.43 ml/m RA Volume:   35.20 ml  14.06 ml/m LA Vol (A4C):   26.1 ml 10.43 ml/m LA Biplane Vol: 29.0 ml 11.59 ml/m   AORTA Ao Root diam: 3.00 cm MITRAL VALVE               TRICUSPID VALVE MV Area (PHT): 3.42 cm    TR Peak grad:   29.4 mmHg MV Decel Time: 222 msec    TR Vmax:        271.00 cm/s MV E velocity: 57.80 cm/s MV A velocity:  50.10 cm/s  SHUNTS MV E/A ratio:  1.15        Systemic Diam: 2.10 cm Lyman Bishop MD Electronically signed by Lyman Bishop MD Signature Date/Time: 12/02/2020/2:35:34 PM    Final    [place] EKG SITE RITE  Result Date: 12/02/2020 If Site Rite image not attached, placement could not be confirmed due to current cardiac rhythm.   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 We have consulted with his primary oncologist who agreed that it is important to start chemotherapy as soon as possible, and also requested another  biopsy to run some additional testing. We will start with R CHOP which is a universally acceptable treatment in large B cell lymphomas despite presence of absence of double/triple hit lymphomas. Since hepatitis panel was pending, we decided to start with CHOP and will add rituximab tomorrow. ECHO, EF satisfactory to proceed Since he was awake, I discussed adverse effects of chemotherapy once again with the patient today as well as the MPOA yesterday Discussed about common side effects such as fatigue, nausea, vomiting,diarrhea, increased risk of infections, cardiotoxicity, neuropathy, and alopecia. He says, he has to try his best.  No dose adjustment needed per pharmacy team given his creatinine impairment. Ritux tomorrow since hep panel is negative. Please monitor CBC, CMP, LDH, Mg, Uric acid, phosphorus daily. Uric acid improving but still very high so added rasburicase again We will start him on filgrastim from Monday, approximately 24 hrs after chemotherapy administration He will FU with Dr Eual Fines once he is discharged.  Benay Pike, MD 12/03/2020  3:30 PM

## 2020-12-03 NOTE — Progress Notes (Signed)
PROGRESS NOTE  Andrew Dominguez  DOB: [birth year]  PCP: Baxter Hire, MD KNL:[id number]  DOA: 11/14/2020  LOS: 2 days   Chief Complaint  Patient presents with  . Abnormal Lab  . Fatigue    Brief narrative: Andrew Dominguez is a 65 y.o. male with PMH significant for DM2, HTN, HLD, A. fib on Eliquis, COPD, pulmonary nodules, lytic bone lesions suspected metastatic lung cancer, liver masses, splenic mass, and left kidney mass who was recently admitted in November 2021 for left hip fracture and underwent surgical fixation. Patient was sent to the ED by EMS from [facility] for worsening lethargy, abnormal calcium and hemoglobin levels.   In the ED, patient was lethargic and unable to give his history but hemodynamically stable. Required 2 L oxygen by nasal cannula. Labs with creatinine elevated 1.91, calcium level elevated to 14.7, intact PTH low at 9 Suspected to have hypercalcemia related to malignancy. Started on IV hydration and admitted to hospitalist service. Oncology service was consulted. Patient has been initiated on chemotherapy.  Subjective: Patient was seen and examined this morning. Mental status improving.  Awake, alert, oriented to place and person this morning. On 2 L oxygen by nasal cannula Not in physical distress.  Assessment/Plan: Hypercalcemia of malignancy -Improving with aggressive hydration.  Currently on normal saline at 150 mill per hour.   -Also due for third dose of calcitonin this morning.   -Calcium trend as below. Recent Labs  Lab 12/05/2020 1801 12/02/20 0220 12/03/20 0700  CALCIUM 14.7* 14.1* 12.3*   Hyperuricemia -Uric acid level elevated at baseline due to malignancy.  On admission it was 17.4, improving, 13.5 today.  Large B-cell lymphoma -With evidence of widespread disease per CAT scan from October 2021.  Biopsy from one of the bone lesions showed large B-cell lymphoma. -Oncology and IR consulted. -Liver biopsy planned this  admission.  Eliquis on hold. -Repeat CT scan of chest abdomen pelvis obtained on 1/28 which showed overall significant progression of malignancy with widespread hepatic masses, pulmonary nodules, thoracic and abdominal lymphadenopathy, enlarging lytic destructive lesions of the proximal femurs, iliac bone, gluteus maximus muscle.   -1/29, initiated systemic chemotherapy with R-CHOP regimen, Solu-Medrol 80 mg daily along with Protonix 40 mg daily.  Acute respiratory failure with hypoxia -On 2 L oxygen by nasal cannula. -CT scan also showed large right and small left pleural effusion with passive atelectasis. -We will order for right thoracentesis.  Acute metabolic encephalopathy -Secondary to elevated calcium level. -Improving.  Oriented to place and person this morning.  Type 2 diabetes mellitus -Seems controlled at this time. A1c 5 on 1/28. -Sliding scale insulin with Accu-Cheks Recent Labs  Lab 12/02/20 0742 12/02/20 1151 12/02/20 1713 12/02/20 2126 12/03/20 0746  GLUCAP 84 88 121* 116* 105*   Chronic A. Fib -Chronically on Eliquis. Currently Eliquis is on hold in preparation of liver biopsy  Obesity - Body mass index is 39.62 kg/m. Patient has been advised to make an attempt to improve diet and exercise patterns to aid in weight loss.  Bilateral lower extremity edema -Obtain echocardiogram to rule out cardiac etiology.  Impaired mobility Recent left hip fracture s/p ORIF - 09/2020 -PT eval ordered. -Patient states, per orthopedics, he is supposed to have nonweightbearing status on the left.  Mobility: PT eval Code Status:   Code Status: Full Code  Nutritional status: Body mass index is 39.62 kg/m. Nutrition Problem: Increased nutrient needs Etiology: cancer and cancer related treatments,chronic illness Signs/Symptoms: estimated needs Diet Order  Diet heart healthy/carb modified Room service appropriate? Yes; Fluid consistency: Thin  Diet effective now                  DVT prophylaxis: Eliquis on hold.  Will initiate Lovenox subcu.   Antimicrobials:  None Fluid: Normal saline at 150 mill per hour Consultants: Oncology, IR Family Communication:  Oncology spoke with significant other  Status is: Inpatient  Remains inpatient appropriate because: Hypercalcemia, needs IV fluid.  Also noted a plan to initiate chemotherapy in the hospital  Dispo: The patient is from: SNF              Anticipated d/c is to: SNF              Anticipated d/c date is: > 3 days              Patient currently is not medically stable to d/c.   Difficult to place patient No   Infusions:  . sodium chloride 150 mL/hr at 12/03/20 0701  . sodium chloride    . rasburicase (ELITEK) IV infusion      Scheduled Meds: . (feeding supplement) PROSource Plus  30 mL Oral TID BM  . allopurinol  300 mg Oral Daily  . atenolol  50 mg Oral Daily  . B-complex with vitamin C   Oral Daily  . calcitonin  400 Units Intramuscular Daily  . Chlorhexidine Gluconate Cloth  6 each Topical Daily  . cyclophosphamide  750 mg/m2 (Treatment Plan Recorded) Intravenous Once  . DOXOrubicin  50 mg/m2 (Treatment Plan Recorded) Intravenous Once  . feeding supplement (GLUCERNA SHAKE)  237 mL Oral TID BM  . fenofibrate  54 mg Oral Daily  . furosemide  20 mg Intravenous Daily  . gabapentin  100 mg Oral TID  . insulin aspart  0-20 Units Subcutaneous TID WC  . insulin aspart  0-5 Units Subcutaneous QHS  . methylPREDNISolone (SOLU-MEDROL) injection  80 mg Intravenous Daily  . pantoprazole  40 mg Intravenous QHS  . rosuvastatin  5 mg Oral Daily  . senna  1 tablet Oral QHS  . sodium chloride flush  10-40 mL Intracatheter Q12H  . [START ON 12/04/2020] Tbo-Filgrastim  480 mcg Subcutaneous Daily  . umeclidinium-vilanterol  1 puff Inhalation Daily  . vinCRIStine (ONCOVIN) CHEMO IV infusion  2 mg Intravenous Once    Antimicrobials: Anti-infectives (From admission, onward)   None      PRN  meds: albuterol, haloperidol lactate, ondansetron **OR** ondansetron (ZOFRAN) IV, oxyCODONE, sodium chloride flush   Objective: Vitals:   12/03/20 0159 12/03/20 0621  BP: 99/62 122/65  Pulse: 80 91  Resp: 16 16  Temp: 97.6 F (36.4 C) (!) 97.4 F (36.3 C)  SpO2: 95% 96%    Intake/Output Summary (Last 24 hours) at 12/03/2020 1157 Last data filed at 12/02/2020 1400 Gross per 24 hour  Intake 294.21 ml  Output 0 ml  Net 294.21 ml   Filed Weights   11/26/2020 2225 12/02/20 0421  Weight: 132.5 kg 132.5 kg   Weight change:  Body mass index is 39.62 kg/m.   Physical Exam: General exam: Elderly obese Caucasian male.  Not in distress Skin: No rashes, lesions or ulcers. HEENT: Atraumatic, normocephalic, no obvious bleeding Lungs: Clear to auscultation bilaterally CVS: Regular rate and rhythm, no murmur GI/Abd soft, nontender, nondistended, bowel sound present CNS: Alert, awake, oriented to place and person.   Psychiatry: Mood appropriate Extremities: 1+ bilateral pedal edema  Data Review: I have personally reviewed the  laboratory data and studies available.  Recent Labs  Lab 11/14/2020 1801 12/02/20 0220 12/03/20 0700  WBC 7.2 5.8 5.9  NEUTROABS  --   --  4.6  HGB 12.3* 11.9* 11.5*  HCT 40.7 40.2 39.2  MCV 79.5* 80.1 81.5  PLT 325 257 286   Recent Labs  Lab 11/17/2020 1801 12/02/20 0220 12/03/20 0700  NA 139 140 145  K 4.5 4.3 4.0  CL 104 105 111  CO2 22 23 22   GLUCOSE 93 90 123*  BUN 59* 55* 48*  CREATININE 1.91* 1.69* 1.32*  CALCIUM 14.7* 14.1* 12.3*  MG  --   --  1.7  PHOS 3.7  --  3.2    F/u labs ordered  Signed, Terrilee Croak, MD [facility] 12/03/2020

## 2020-12-03 NOTE — Progress Notes (Signed)
   12/03/20 2036  Assess: MEWS Score  Temp 97.8 F (36.6 C)  BP (!) 91/54  Pulse Rate (!) 109  Resp 18  Level of Consciousness Alert  SpO2 95 %  O2 Device Nasal Cannula  O2 Flow Rate (L/min) 2 L/min  Assess: MEWS Score  MEWS Temp 0  MEWS Systolic 1  MEWS Pulse 1  MEWS RR 0  MEWS LOC 0  MEWS Score 2  MEWS Score Color Yellow  Assess: if the MEWS score is Yellow or Red  Were vital signs taken at a resting state? Yes  Focused Assessment No change from prior assessment  Early Detection of Sepsis Score *See Row Information* Low  MEWS guidelines implemented *See Row Information* Yes  Take Vital Signs  Increase Vital Sign Frequency  Yellow: Q 2hr X 2 then Q 4hr X 2, if remains yellow, continue Q 4hrs  Escalate  MEWS: Escalate Yellow: discuss with charge nurse/RN and consider discussing with provider and RRT  Notify: Charge Nurse/RN  Name of Charge Nurse/RN Notified Vera, RN  Date Charge Nurse/RN Notified 12/03/20  Time Charge Nurse/RN Notified 2038  Notify: Provider  Provider Name/Title Kennon Holter, NP  Date Provider Notified 12/03/20  Time Provider Notified 2038  Notification Type Page  Notification Reason Change in status  Will continue to monitor pt closely. Cyndi Lennert, RN

## 2020-12-04 ENCOUNTER — Encounter (HOSPITAL_COMMUNITY): Payer: Self-pay | Admitting: Internal Medicine

## 2020-12-04 LAB — CBC WITH DIFFERENTIAL/PLATELET
Abs Immature Granulocytes: 0.02 10*3/uL (ref 0.00–0.07)
Basophils Absolute: 0 10*3/uL (ref 0.0–0.1)
Basophils Relative: 0 %
Eosinophils Absolute: 0 10*3/uL (ref 0.0–0.5)
Eosinophils Relative: 0 %
HCT: 30.2 % — ABNORMAL LOW (ref 39.0–52.0)
Hemoglobin: 8.7 g/dL — ABNORMAL LOW (ref 13.0–17.0)
Immature Granulocytes: 1 %
Lymphocytes Relative: 8 %
Lymphs Abs: 0.4 10*3/uL — ABNORMAL LOW (ref 0.7–4.0)
MCH: 24.2 pg — ABNORMAL LOW (ref 26.0–34.0)
MCHC: 28.8 g/dL — ABNORMAL LOW (ref 30.0–36.0)
MCV: 84.1 fL (ref 80.0–100.0)
Monocytes Absolute: 0.4 10*3/uL (ref 0.1–1.0)
Monocytes Relative: 8 %
Neutro Abs: 3.5 10*3/uL (ref 1.7–7.7)
Neutrophils Relative %: 83 %
Platelets: 172 10*3/uL (ref 150–400)
RBC: 3.59 MIL/uL — ABNORMAL LOW (ref 4.22–5.81)
RDW: 16.7 % — ABNORMAL HIGH (ref 11.5–15.5)
WBC: 4.3 10*3/uL (ref 4.0–10.5)
nRBC: 0 % (ref 0.0–0.2)

## 2020-12-04 LAB — COMPREHENSIVE METABOLIC PANEL
ALT: 10 U/L (ref 0–44)
AST: 12 U/L — ABNORMAL LOW (ref 15–41)
Albumin: 2.1 g/dL — ABNORMAL LOW (ref 3.5–5.0)
Alkaline Phosphatase: 42 U/L (ref 38–126)
Anion gap: 7 (ref 5–15)
BUN: 41 mg/dL — ABNORMAL HIGH (ref 8–23)
CO2: 21 mmol/L — ABNORMAL LOW (ref 22–32)
Calcium: 8.8 mg/dL — ABNORMAL LOW (ref 8.9–10.3)
Chloride: 116 mmol/L — ABNORMAL HIGH (ref 98–111)
Creatinine, Ser: 0.95 mg/dL (ref 0.61–1.24)
GFR, Estimated: >60 mL/min (ref >60)
Glucose, Bld: 117 mg/dL — ABNORMAL HIGH (ref 70–99)
Potassium: 3 mmol/L — ABNORMAL LOW (ref 3.5–5.1)
Sodium: 144 mmol/L (ref 135–145)
Total Bilirubin: 0.4 mg/dL (ref 0.3–1.2)
Total Protein: 4.1 g/dL — ABNORMAL LOW (ref 6.5–8.1)

## 2020-12-04 LAB — GLUCOSE, CAPILLARY
Glucose-Capillary: 114 mg/dL — ABNORMAL HIGH (ref 70–99)
Glucose-Capillary: 169 mg/dL — ABNORMAL HIGH (ref 70–99)
Glucose-Capillary: 140 mg/dL — ABNORMAL HIGH (ref 70–99)
Glucose-Capillary: 152 mg/dL — ABNORMAL HIGH (ref 70–99)

## 2020-12-04 LAB — URIC ACID: Uric Acid, Serum: 4.5 mg/dL (ref 3.7–8.6)

## 2020-12-04 LAB — MAGNESIUM: Magnesium: 1.3 mg/dL — ABNORMAL LOW (ref 1.7–2.4)

## 2020-12-04 LAB — LACTATE DEHYDROGENASE: LDH: 253 U/L — ABNORMAL HIGH (ref 98–192)

## 2020-12-04 LAB — PHOSPHORUS: Phosphorus: 2.6 mg/dL (ref 2.5–4.6)

## 2020-12-04 MED ORDER — POTASSIUM CHLORIDE CRYS ER 20 MEQ PO TBCR
40.0000 meq | EXTENDED_RELEASE_TABLET | Freq: Once | ORAL | Status: AC
  Administered 2020-12-04: 40 meq via ORAL
  Filled 2020-12-04: qty 2

## 2020-12-04 NOTE — Progress Notes (Signed)
PROGRESS NOTE  Andrew Dominguez  DOB: 1956/10/28  PCP: Baxter Hire, MD WUJ:[id number]  DOA: 11/21/2020  LOS: 3 days   Chief Complaint  Patient presents with  . Abnormal Lab  . Fatigue    Brief narrative: Andrew Dominguez is a 65 y.o. male with PMH significant for DM2, HTN, HLD, A. fib on Eliquis, COPD, pulmonary nodules, lytic bone lesions suspected metastatic lung cancer, liver masses, splenic mass, and left kidney mass who was recently admitted in November 2021 for left hip fracture and underwent surgical fixation. Patient was sent to the ED by EMS from [facility] for worsening lethargy, abnormal calcium and hemoglobin levels.   In the ED, patient was lethargic and unable to give his history but hemodynamically stable. Required 2 L oxygen by nasal cannula. Labs with creatinine elevated 1.91, calcium level elevated to 14.7, intact PTH low at 9 Suspected to have hypercalcemia related to malignancy. Started on IV hydration and admitted to hospitalist service. Oncology service was consulted. Patient has been initiated on chemotherapy.  Subjective: Patient was seen and examined this morning. Lying in bed.  Not in distress.  No new symptoms.  Started on chemotherapy yesterday.  Assessment/Plan: Hypercalcemia of malignancy -Currently improving with forced diuresis with normal saline at 150 mill per hour and Lasix 20 mg IV daily.  Also completed 3 doses of calcitonin. -Calcium trend as below.  In normal range this morning. Recent Labs  Lab 11/18/2020 1801 12/02/20 0220 12/03/20 0700 12/04/20 0500  CALCIUM 14.7* 14.1* 12.3* 8.8*   Hyperuricemia -Uric acid level elevated at baseline due to malignancy.   -On admission it was 17.4, improving, significantly better at 4.5 today.  Rasburicase given by oncology.  Large B-cell lymphoma -With evidence of widespread disease per CAT scan from October 2021.  Biopsy from one of the bone lesions showed large B-cell  lymphoma. -Oncology and IR consulted. -Liver biopsy planned this admission.  Eliquis on hold. -Repeat CT scan of chest abdomen pelvis obtained on 1/28 which showed overall significant progression of malignancy with widespread hepatic masses, pulmonary nodules, thoracic and abdominal lymphadenopathy, enlarging lytic destructive lesions of the proximal femurs, iliac bone, gluteus maximus muscle.   -1/29, initiated systemic chemotherapy with R-CHOP regimen, Solu-Medrol 80 mg daily along with Protonix 40 mg daily.  Acute respiratory failure with hypoxia -On 2 L oxygen by nasal cannula. -CT scan also showed large right and small left pleural effusion with passive atelectasis. -We will order for right thoracentesis.  Acute metabolic encephalopathy -Secondary to elevated calcium level. -Improving.  Oriented to place and person.  Type 2 diabetes mellitus -Seems controlled at this time. A1c 5 on 1/28. -Sliding scale insulin with Accu-Cheks Recent Labs  Lab 12/03/20 0746 12/03/20 1205 12/03/20 1811 12/03/20 2213 12/04/20 0742  GLUCAP 105* 150* 114* 158* 114*   Chronic A. Fib -Chronically on Eliquis. Currently Eliquis is on hold in preparation of liver biopsy.  Obesity - Body mass index is 39.62 kg/m. Patient has been advised to make an attempt to improve diet and exercise patterns to aid in weight loss.  Bilateral lower extremity edema -Echo with EF 65 to 70%, normal pulmonary artery systolic pressure -Currently on IV Lasix as well as fluid.  Once okayed by oncology team, will start on further diuresis.  Impaired mobility Recent left hip fracture s/p ORIF - 09/2020 -PT eval ordered. -Patient states, per orthopedics, he is supposed to have nonweightbearing status on the left.  Mobility: PT eval Code Status:   Code  Status: Full Code  Nutritional status: Body mass index is 39.62 kg/m. Nutrition Problem: Increased nutrient needs Etiology: cancer and cancer related treatments,chronic  illness Signs/Symptoms: estimated needs Diet Order            Diet heart healthy/carb modified Room service appropriate? Yes; Fluid consistency: Thin  Diet effective now                 DVT prophylaxis: Lovenox subcu.  Eliquis on hold   Antimicrobials:  None Fluid: Normal saline at 150 mill per hour Consultants: Oncology, IR Family Communication:  Oncology spoke with significant other  Status is: Inpatient  Remains inpatient appropriate because: Hypercalcemia, currently on chemotherapy Dispo: The patient is from: SNF              Anticipated d/c is to: SNF              Anticipated d/c date is: > 3 days              Patient currently is not medically stable to d/c.   Difficult to place patient No   Infusions:  . sodium chloride 150 mL/hr at 12/03/20 2137  . sodium chloride    . sodium chloride      Scheduled Meds: . (feeding supplement) PROSource Plus  30 mL Oral TID BM  . allopurinol  300 mg Oral Daily  . atenolol  50 mg Oral Daily  . B-complex with vitamin C   Oral Daily  . Chlorhexidine Gluconate Cloth  6 each Topical Daily  . enoxaparin (LOVENOX) injection  130 mg Subcutaneous BID  . feeding supplement (GLUCERNA SHAKE)  237 mL Oral TID BM  . fenofibrate  54 mg Oral Daily  . furosemide  20 mg Intravenous Daily  . gabapentin  100 mg Oral TID  . insulin aspart  0-20 Units Subcutaneous TID WC  . insulin aspart  0-5 Units Subcutaneous QHS  . methylPREDNISolone (SOLU-MEDROL) injection  80 mg Intravenous Daily  . pantoprazole  40 mg Intravenous QHS  . potassium chloride  40 mEq Oral Once  . riTUXimab-pvvr (RUXIENCE) IV infusion  375 mg/m2 (Treatment Plan Recorded) Intravenous Once  . rosuvastatin  5 mg Oral Daily  . senna  1 tablet Oral QHS  . sodium chloride flush  10-40 mL Intracatheter Q12H  . Tbo-Filgrastim  480 mcg Subcutaneous q1800  . umeclidinium-vilanterol  1 puff Inhalation Daily    Antimicrobials: Anti-infectives (From admission, onward)   None       PRN meds: albuterol, haloperidol lactate, ondansetron **OR** ondansetron (ZOFRAN) IV, oxyCODONE, sodium chloride flush   Objective: Vitals:   12/04/20 0807 12/04/20 0950  BP:  99/68  Pulse:  (!) 108  Resp:  17  Temp:  98.8 F (37.1 C)  SpO2: 93% 95%    Intake/Output Summary (Last 24 hours) at 12/04/2020 1139 Last data filed at 12/04/2020 0650 Gross per 24 hour  Intake 2040 ml  Output -  Net 2040 ml   Filed Weights   11/06/2020 2225 12/02/20 0421  Weight: 132.5 kg 132.5 kg   Weight change:  Body mass index is 39.62 kg/m.   Physical Exam: General exam: Elderly obese Caucasian male.  Not in distress Skin: No rashes, lesions or ulcers. HEENT: Atraumatic, normocephalic, no obvious bleeding Lungs: Clear to auscultation bilaterally CVS: Regular rate and rhythm, no murmur GI/Abd soft, nontender, nondistended, bowel sound present CNS: Alert, awake, oriented to place and person Psychiatry: Mood appropriate Extremities: Bilateral 1-2+ pedal edema.  No  calf tenderness  Data Review: I have personally reviewed the laboratory data and studies available.  Recent Labs  Lab 11/14/2020 1801 12/02/20 0220 12/03/20 0700 12/04/20 0500  WBC 7.2 5.8 5.9 4.3  NEUTROABS  --   --  4.6 3.5  HGB 12.3* 11.9* 11.5* 8.7*  HCT 40.7 40.2 39.2 30.2*  MCV 79.5* 80.1 81.5 84.1  PLT 325 257 286 172   Recent Labs  Lab 11/28/2020 1801 12/02/20 0220 12/03/20 0700 12/04/20 0500  NA 139 140 145 144  K 4.5 4.3 4.0 3.0*  CL 104 105 111 116*  CO2 22 23 22  21*  GLUCOSE 93 90 123* 117*  BUN 59* 55* 48* 41*  CREATININE 1.91* 1.69* 1.32* 0.95  CALCIUM 14.7* 14.1* 12.3* 8.8*  MG  --   --  1.7 1.3*  PHOS 3.7  --  3.2 2.6    F/u labs ordered  Signed, Terrilee Croak, MD [facility] 12/04/2020

## 2020-12-04 NOTE — Progress Notes (Signed)
PT Cancellation Note  Patient Details Name: Andrew Dominguez MRN: [id number] DOB: 27-Mar-1956   Cancelled Treatment:    Reason Eval/Treat Not Completed:  Attempted PT eval-RN does not want pt out of bed on today. Pt also declined sitting EOB. Will check back another day.    [facility]  Office: [phone] Pager: [phone]

## 2020-12-04 NOTE — H&P (Addendum)
Chief Complaint: Lymphoma  Referring Physician(s): Benay Pike, MD  Supervising Physician: Jacqulynn Cadet  Patient Status: [facility] - In-pt  History of Present Illness: Andrew Dominguez is a 65 y.o. 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    He was brought to the hospital on 11/25/2020 with altered mental status.  In the ED labs showed BUN of 59 creatinine 1.91.  Calcium is 14.7.  Corrected calcium is more than 15 and he was admitted for treatment.  September 24, 2020 he was admitted for hip fracutre.  He underwent placement of an intramedullary nail hip screw by Dr. Marlou Sa with orthopedics.    Biopsy was done at that time of a bone lesion. Pathology revealed large B-cell lymphoma.    He has been followed by oncology at [facility].    Additional work-up was planned to initiate treatment but the patient could not get to their facility as he was residing in a SNF in [place] for rehab after the hip fracture.   CT showed= Overall significant progression of malignancy. There is enlargement of the widespread hepatic masses; new and enlarging sub solid pulmonary nodules; new and enlarging thoracic and abdominal adenopathy; enlarging lytic destructive lesions of the proximal femurs and left iliac bone; new mass in the left gluteus maximus muscle; and similar large mass in the spleen but with some new soft tissue nodules along the medial spleen.      We are asked to perform an image guided biopsy. Images reviewed by Dr.  Laurence Ferrari and the safest area is the left iliac soft tissue mass.  Past Medical History:  Diagnosis Date  . Anemia   . Arthritis    knees  . COPD (chronic obstructive pulmonary disease) ([place])   . Diabetes mellitus without  complication ([place])   . Hyperlipemia   . Hypertension   . Hypertriglyceridemia   . Hypogonadism in male   . Numbness of foot    bilateral  . Obesity   . Tubular adenoma of colon   . Vitamin D deficiency     Past Surgical History:  Procedure Laterality Date  . CATARACT EXTRACTION W/PHACO Right 04/07/2019   Procedure: CATARACT EXTRACTION PHACO AND INTRAOCULAR LENS PLACEMENT ([place])  RIGHT DIABETIC;  Surgeon: Birder Robson, MD;  Location: [facility];  Service: Ophthalmology;  Laterality: Right;  Diabetic - oral meds  . COLONOSCOPY    . COLONOSCOPY WITH PROPOFOL N/A 05/16/2015   Procedure: COLONOSCOPY WITH PROPOFOL;  Surgeon: Manya Silvas, MD;  Location: [facility] ENDOSCOPY;  Service: Endoscopy;  Laterality: N/A;  . FEMUR IM NAIL Left 09/25/2020   Procedure: INTRAMEDULLARY (IM) NAIL HIP SCREW;  Surgeon: Meredith Pel, MD;  Location: [facility];  Service: Orthopedics;  Laterality: Left;  . ORIF FINGER / THUMB FRACTURE Left    thumb, 1970s    Allergies: Sulfa antibiotics  Medications: Prior to Admission medications   Medication Sig Start Date End Date Taking? Authorizing Provider  amLODipine (NORVASC) 10 MG tablet Take 10 mg by mouth daily.   Yes [provider]  ANORO ELLIPTA 62.5-25 MCG/INH AEPB Inhale 1 puff into the lungs daily. 11/07/20  Yes [provider]  aspirin EC 81 MG tablet Take 81 mg by mouth daily.   Yes [provider]  atenolol (TENORMIN) 50 MG tablet Take 50  mg by mouth daily.   Yes [provider]  B Complex Vitamins (VITAMIN B COMPLEX PO) Take by mouth daily.   Yes [provider]  diltiazem (CARDIZEM CD) 300 MG 24 hr capsule Take 1 capsule (300 mg total) by mouth daily. 10/03/20 10/03/21 Yes Charlynne Cousins, MD  ELIQUIS 5 MG TABS tablet Take 5 mg by mouth 2 (two) times daily. 07/28/20  Yes [provider]  empagliflozin (JARDIANCE) 25 MG TABS tablet Take 25 mg by mouth daily.  02/23/20 02/22/21 Yes  [provider]  fenofibrate 54 MG tablet Take 54 mg by mouth daily.   Yes [provider]  furosemide (LASIX) 20 MG tablet Take 20 mg by mouth daily.  02/03/18  Yes [provider]  gabapentin (NEURONTIN) 100 MG capsule Take 100 mg by mouth 3 (three) times daily.   Yes [provider]  guaiFENesin (MUCINEX) 600 MG 12 hr tablet Take 600 mg by mouth every 6 (six) hours as needed for cough.   Yes [provider]  losartan (COZAAR) 50 MG tablet Take 50 mg by mouth daily. Hold if BP is less than or equal to 100/60   Yes [provider]  metFORMIN (GLUCOPHAGE) 1000 MG tablet Take 1,000 mg by mouth 2 (two) times daily with a meal.   Yes [provider]  methocarbamol (ROBAXIN) 500 MG tablet Take 1 tablet (500 mg total) by mouth every 6 (six) hours as needed for muscle spasms. 10/03/20  Yes Charlynne Cousins, MD  oxyCODONE (OXY IR/ROXICODONE) 5 MG immediate release tablet Take 1-2 tablets (5-10 mg total) by mouth every 4 (four) hours as needed for moderate pain (pain score 4-6). 10/03/20  Yes Charlynne Cousins, MD  rosuvastatin (CRESTOR) 5 MG tablet Take 5 mg by mouth daily.   Yes [provider]  senna (SENOKOT) 8.6 MG TABS tablet Take 1 tablet by mouth in the morning and at bedtime.   Yes [provider]  VICTOZA 18 MG/3ML SOPN Inject 1.8 mg into the skin at bedtime.  12/01/16  Yes [provider]  albuterol (VENTOLIN HFA) 108 (90 Base) MCG/ACT inhaler Inhale into the lungs every 6 (six) hours as needed for wheezing or shortness of breath. Patient not taking: No sig reported    [provider]  Blood Glucose Monitoring Suppl (GLUCOCOM BLOOD GLUCOSE MONITOR) DEVI 1 each by XX route as directed. 07/29/15   [provider]  glucose blood (ONETOUCH ULTRA) test strip Use 3 (three) times daily E11.29 08/21/19   [provider]  Insulin Pen Needle (B-D UF III MINI PEN NEEDLES) 31G X 5 MM MISC  Use once daily E11.29 11/25/19   [provider]     Family History  Problem Relation Age of Onset  . Lung cancer Father   . Prostate cancer Neg Hx   . Bladder Cancer Neg Hx   . Kidney cancer Neg Hx     Social History   Socioeconomic History  . Marital status: Legally Separated    Spouse name: Not on file  . Number of children: Not on file  . Years of education: Not on file  . Highest education level: Not on file  Occupational History  . Not on file  Tobacco Use  . Smoking status: Former Smoker    Quit date: 2000    Years since quitting: 22.0  . Smokeless tobacco: Never Used  Vaping Use  . Vaping Use: Never used  Substance and Sexual Activity  .  Alcohol use: Yes    Alcohol/week: 14.0 standard drinks    Types: 14 Shots of liquor per week  . Drug use: No  . Sexual activity: Not on file  Other Topics Concern  . Not on file  Social History Narrative  . Not on file   Social Determinants of Health   Financial Resource Strain: Not on file  Food Insecurity: Not on file  Transportation Needs: Not on file  Physical Activity: Not on file  Stress: Not on file  Social Connections: Not on file     Review of Systems: A 12 point ROS discussed and pertinent positives are indicated in the HPI above.  All other systems are negative.  Review of Systems  Vital Signs: BP 99/68 (BP Location: Left Arm)   Pulse (!) 108   Temp 98.8 F (37.1 C) (Oral)   Resp 17   Ht 6' (1.829 m)   Wt 132.5 kg   SpO2 95%   BMI 39.62 kg/m   Physical Exam Vitals reviewed.  Constitutional:      Appearance: He is obese.  HENT:     Head: Normocephalic and atraumatic.  Eyes:     Extraocular Movements: Extraocular movements intact.  Cardiovascular:     Rate and Rhythm: Normal rate and regular rhythm.  Pulmonary:     Effort: Pulmonary effort is normal. No respiratory distress.     Breath sounds: Normal breath sounds.  Abdominal:     Palpations: Abdomen is soft.  Musculoskeletal:         General: Normal range of motion.     Cervical back: Normal range of motion.  Skin:    General: Skin is warm and dry.  Neurological:     General: No focal deficit present.     Mental Status: He is alert and oriented to person, place, and time.  Psychiatric:        Mood and Affect: Mood normal.        Behavior: Behavior normal.        Thought Content: Thought content normal.        Judgment: Judgment normal.     Imaging: CT CHEST W CONTRAST  Result Date: 12/02/2020 CLINICAL DATA:  Diffuse large B-cell lymphoma restaging EXAM: CT CHEST, ABDOMEN, AND PELVIS WITH CONTRAST TECHNIQUE: Multidetector CT imaging of the chest, abdomen and pelvis was performed following the standard protocol during bolus administration of intravenous contrast. CONTRAST:  3mL OMNIPAQUE IOHEXOL 300 MG/ML  SOLN COMPARISON:  09/01/2020 FINDINGS: CT CHEST FINDINGS Cardiovascular: Right PICC line tip: SVC Coronary, aortic arch, and branch vessel atherosclerotic vascular disease. Mediastinum/Nodes: Distal paraesophageal lymph node 1.2 cm in short axis on image 45 series 2, previously 0.5 cm. Pericardial lymph node 1.3 cm in short axis on image 43 series 2, previously not well seen. Lungs/Pleura: Large right and small left pleural effusions with passive atelectasis. Progressive scattered primarily sub solid pulmonary nodules are observed. For example, a left apical nodule is now mostly solid, with a hazy rim of sub solid opacity, measuring 3.0 by 2 point 3 cm on image 18 of series 4, previously completely sub solid measuring 1.3 by 1.0 cm. Some of the scattered nodules are similar to prior and others likewise demonstrate enlargement including the sub solid 2.9 by 2.3 cm right upper lobe nodule on image 44 of series 4 which was previously 1.3 by 0.9 cm. Pleural-based nodularity along the left hemidiaphragm is observed. Musculoskeletal: Thoracic spondylosis. CT ABDOMEN PELVIS FINDINGS Hepatobiliary: Hepatomegaly with widespread  masses within along the liver and possibly infiltrative component. Lateral segment left hepatic lobe hypodensity currently 15.7 by 11.1 cm, formerly 6.3 by 5.4 cm. Lesion posteriorly in the right hepatic lobe measuring 10.1 by 8.1 cm, formerly 7.0 by 6.2 cm. Multiple new hypodense lesions are present in the liver. Small gallstones are present in the gallbladder. Pancreas: Unremarkable Spleen: Large hypodense mass of the upper spleen measuring 11.9 by 11.3 cm, formerly 12.5 by 10.0 cm, roughly stable. However, there is new nodularity tracking along the inferomedial spleen margin including a 2.5 by 1.9 cm lesion on image 72 of series 2 which previously measured 1.1 cm in diameter. Adrenals/Urinary Tract: Small hypodense mass of the lateral limb left adrenal gland is probably a myelolipoma. This appears unchanged. Hypodense exophytic 1.9 by 1.7 cm left kidney upper pole cyst noted. Urinary bladder unremarkable. Stomach/Bowel: There are some scattered diverticula of the descending and sigmoid colon. Vascular/Lymphatic: Aortoiliac atherosclerotic vascular disease. Retrocrural node 1.3 cm in short axis on image 60 series 2, formerly 0.6 cm. Conglomerate adenopathy near the left adrenal gland measures about 3.9 by 5.3 cm on image 62 of series 2, formerly measured at 5.1 by 3.1 cm. Reproductive: Unremarkable Other: Mild perihepatic ascites. Trace edema along the omentum. Small amount of pelvic ascites. Subcutaneous edema along the flanks. Musculoskeletal: The lytic lesion of the left posterior iliac bone is substantially increased in size, with large extraosseous component, measuring 11.7 by 8.2 cm on image 93 of series 2 (formerly 4.5 by 3.1 cm). Large lytic destructive mass of the left proximal femur noted in the vicinity of an IM rod, the mass now has a large extraosseous component and on the lower most image measures about 12.0 by 9.5 cm, formerly 3.3 by 3.2 cm. There is also a lytic lesion in the right proximal femur  starting to erode the cortex, measuring 3.3 by 2.4 cm, previously 2.8 by 1.9 cm. New mass in the left gluteus maximus muscle, 5.8 by 3.7 cm. Lower lumbar spondylosis and degenerative disc disease. IMPRESSION: 1. Overall significant progression of malignancy. There is enlargement of the widespread hepatic masses; new and enlarging sub solid pulmonary nodules; new and enlarging thoracic and abdominal adenopathy; enlarging lytic destructive lesions of the proximal femurs and left iliac bone; new mass in the left gluteus maximus muscle; and similar large mass in the spleen but with some new soft tissue nodules along the medial spleen. 2. Large right and small left pleural effusions with passive atelectasis. 3. Other imaging findings of potential clinical significance: Coronary, aortic arch, and branch vessel atherosclerotic vascular disease. Cholelithiasis. Mild perihepatic and pelvic ascites. Subcutaneous edema along the flanks. Small left adrenal myelolipoma. Small amount of pelvic ascites. 4. Aortic atherosclerosis. Aortic Atherosclerosis (ICD10-I70.0). Electronically Signed   By: Van Clines M.D.   On: 12/02/2020 21:00   CT ABDOMEN PELVIS W CONTRAST  Result Date: 12/02/2020 CLINICAL DATA:  Diffuse large B-cell lymphoma restaging EXAM: CT CHEST, ABDOMEN, AND PELVIS WITH CONTRAST TECHNIQUE: Multidetector CT imaging of the chest, abdomen and pelvis was performed following the standard protocol during bolus administration of intravenous contrast. CONTRAST:  34mL OMNIPAQUE IOHEXOL 300 MG/ML  SOLN COMPARISON:  09/01/2020 FINDINGS: CT CHEST FINDINGS Cardiovascular: Right PICC line tip: SVC Coronary, aortic arch, and branch vessel atherosclerotic vascular disease. Mediastinum/Nodes: Distal paraesophageal lymph node 1.2 cm in short axis on image 45 series 2, previously 0.5 cm. Pericardial lymph node 1.3 cm in short axis on image 43 series 2, previously not well seen. Lungs/Pleura: Large right  and small left  pleural effusions with passive atelectasis. Progressive scattered primarily sub solid pulmonary nodules are observed. For example, a left apical nodule is now mostly solid, with a hazy rim of sub solid opacity, measuring 3.0 by 2 point 3 cm on image 18 of series 4, previously completely sub solid measuring 1.3 by 1.0 cm. Some of the scattered nodules are similar to prior and others likewise demonstrate enlargement including the sub solid 2.9 by 2.3 cm right upper lobe nodule on image 44 of series 4 which was previously 1.3 by 0.9 cm. Pleural-based nodularity along the left hemidiaphragm is observed. Musculoskeletal: Thoracic spondylosis. CT ABDOMEN PELVIS FINDINGS Hepatobiliary: Hepatomegaly with widespread masses within along the liver and possibly infiltrative component. Lateral segment left hepatic lobe hypodensity currently 15.7 by 11.1 cm, formerly 6.3 by 5.4 cm. Lesion posteriorly in the right hepatic lobe measuring 10.1 by 8.1 cm, formerly 7.0 by 6.2 cm. Multiple new hypodense lesions are present in the liver. Small gallstones are present in the gallbladder. Pancreas: Unremarkable Spleen: Large hypodense mass of the upper spleen measuring 11.9 by 11.3 cm, formerly 12.5 by 10.0 cm, roughly stable. However, there is new nodularity tracking along the inferomedial spleen margin including a 2.5 by 1.9 cm lesion on image 72 of series 2 which previously measured 1.1 cm in diameter. Adrenals/Urinary Tract: Small hypodense mass of the lateral limb left adrenal gland is probably a myelolipoma. This appears unchanged. Hypodense exophytic 1.9 by 1.7 cm left kidney upper pole cyst noted. Urinary bladder unremarkable. Stomach/Bowel: There are some scattered diverticula of the descending and sigmoid colon. Vascular/Lymphatic: Aortoiliac atherosclerotic vascular disease. Retrocrural node 1.3 cm in short axis on image 60 series 2, formerly 0.6 cm. Conglomerate adenopathy near the left adrenal gland measures about 3.9 by 5.3  cm on image 62 of series 2, formerly measured at 5.1 by 3.1 cm. Reproductive: Unremarkable Other: Mild perihepatic ascites. Trace edema along the omentum. Small amount of pelvic ascites. Subcutaneous edema along the flanks. Musculoskeletal: The lytic lesion of the left posterior iliac bone is substantially increased in size, with large extraosseous component, measuring 11.7 by 8.2 cm on image 93 of series 2 (formerly 4.5 by 3.1 cm). Large lytic destructive mass of the left proximal femur noted in the vicinity of an IM rod, the mass now has a large extraosseous component and on the lower most image measures about 12.0 by 9.5 cm, formerly 3.3 by 3.2 cm. There is also a lytic lesion in the right proximal femur starting to erode the cortex, measuring 3.3 by 2.4 cm, previously 2.8 by 1.9 cm. New mass in the left gluteus maximus muscle, 5.8 by 3.7 cm. Lower lumbar spondylosis and degenerative disc disease. IMPRESSION: 1. Overall significant progression of malignancy. There is enlargement of the widespread hepatic masses; new and enlarging sub solid pulmonary nodules; new and enlarging thoracic and abdominal adenopathy; enlarging lytic destructive lesions of the proximal femurs and left iliac bone; new mass in the left gluteus maximus muscle; and similar large mass in the spleen but with some new soft tissue nodules along the medial spleen. 2. Large right and small left pleural effusions with passive atelectasis. 3. Other imaging findings of potential clinical significance: Coronary, aortic arch, and branch vessel atherosclerotic vascular disease. Cholelithiasis. Mild perihepatic and pelvic ascites. Subcutaneous edema along the flanks. Small left adrenal myelolipoma. Small amount of pelvic ascites. 4. Aortic atherosclerosis. Aortic Atherosclerosis (ICD10-I70.0). Electronically Signed   By: Van Clines M.D.   On: 12/02/2020 21:00  DG CHEST PORT 1 VIEW  Result Date: 12/02/2020 CLINICAL DATA:  Right PICC  placement. EXAM: PORTABLE CHEST 1 VIEW COMPARISON:  Most recent radiograph 09/27/2020, CT 09/01/2020 FINDINGS: Right upper extremity PICC tip projects over the upper SVC. Layering density in the right hemithorax consistent with pleural effusion. Mild volume loss in the right hemithorax suggest underlying atelectasis. There is no pneumothorax. Minor atelectasis at the left lung base. The heart is normal in size. No large left pleural effusion. IMPRESSION: 1. Right upper extremity PICC tip projects over the upper SVC. 2. Layering right pleural effusion, at least moderate in size, with likely associated atelectasis. Electronically Signed   By: Keith Rake M.D.   On: 12/02/2020 17:17   ECHOCARDIOGRAM COMPLETE  Result Date: 12/02/2020    ECHOCARDIOGRAM REPORT   Patient Name:   THAILAN SAVA Date of Exam: 12/02/2020 Medical Rec #:  [id number]          Height:       72.0 in Accession #:    [id number]         Weight:       292.1 lb Date of Birth:  [birth year]          BSA:          2.503 m Patient Age:    25 years           BP:           97/66 mmHg Patient Gender: M                  HR:           96 bpm. Exam Location:  Inpatient Procedure: 2D Echo and Intracardiac Opacification Agent Indications:    Chemo Z09  History:        Patient has no prior history of Echocardiogram examinations.                 Arrythmias:Atrial Fibrillation, Signs/Symptoms:Fever; Risk                 Factors:Former Smoker, Diabetes, Dyslipidemia and Hypertension.  Sonographer:    Leavy Cella Referring Phys: [place]  Sonographer Comments: Technically difficult study due to poor echo windows. IMPRESSIONS  1. Left ventricular ejection fraction, by estimation, is 65 to 70%. The left ventricle has normal function. The left ventricle has no regional wall motion abnormalities. There is mild left ventricular hypertrophy. Left ventricular diastolic parameters were normal.  2. Right ventricular systolic function is normal. The  right ventricular size is normal. There is normal pulmonary artery systolic pressure.  3. The mitral valve is grossly normal. No evidence of mitral valve regurgitation.  4. The aortic valve is tricuspid. Aortic valve regurgitation is not visualized.  5. The inferior vena cava is normal in size with greater than 50% respiratory variability, suggesting right atrial pressure of 3 mmHg. Comparison(s): No prior Echocardiogram. FINDINGS  Left Ventricle: Left ventricular ejection fraction, by estimation, is 65 to 70%. The left ventricle has normal function. The left ventricle has no regional wall motion abnormalities. Definity contrast agent was given IV to delineate the left ventricular  endocardial borders. The left ventricular internal cavity size was normal in size. There is mild left ventricular hypertrophy. Left ventricular diastolic parameters were normal. Right Ventricle: The right ventricular size is normal. No increase in right ventricular wall thickness. Right ventricular systolic function is normal. There is normal pulmonary artery systolic pressure. The tricuspid regurgitant velocity is 2.71 m/s, and  with an assumed right atrial pressure of 3 mmHg, the estimated right ventricular systolic pressure is 99.8 mmHg. Left Atrium: Left atrial size was normal in size. Right Atrium: Right atrial size was normal in size. Pericardium: There is no evidence of pericardial effusion. Mitral Valve: The mitral valve is grossly normal. No evidence of mitral valve regurgitation. Tricuspid Valve: The tricuspid valve is grossly normal. Tricuspid valve regurgitation is trivial. Aortic Valve: The aortic valve is tricuspid. Aortic valve regurgitation is not visualized. Pulmonic Valve: The pulmonic valve was normal in structure. Pulmonic valve regurgitation is not visualized. Aorta: The aortic root and ascending aorta are structurally normal, with no evidence of dilitation. Venous: The inferior vena cava is normal in size with  greater than 50% respiratory variability, suggesting right atrial pressure of 3 mmHg. IAS/Shunts: No atrial level shunt detected by color flow Doppler.  LEFT VENTRICLE PLAX 2D LVIDd:         4.40 cm  Diastology LVIDs:         2.60 cm  LV e' medial:    11.60 cm/s LV PW:         1.40 cm  LV E/e' medial:  5.0 LV IVS:        1.10 cm  LV e' lateral:   19.00 cm/s LVOT diam:     2.10 cm  LV E/e' lateral: 3.0 LVOT Area:     3.46 cm  RIGHT VENTRICLE RV S prime:     25.80 cm/s TAPSE (M-mode): 1.8 cm LEFT ATRIUM             Index       RIGHT ATRIUM           Index LA diam:        2.60 cm 1.04 cm/m  RA Area:     13.60 cm LA Vol (A2C):   28.6 ml 11.43 ml/m RA Volume:   35.20 ml  14.06 ml/m LA Vol (A4C):   26.1 ml 10.43 ml/m LA Biplane Vol: 29.0 ml 11.59 ml/m   AORTA Ao Root diam: 3.00 cm MITRAL VALVE               TRICUSPID VALVE MV Area (PHT): 3.42 cm    TR Peak grad:   29.4 mmHg MV Decel Time: 222 msec    TR Vmax:        271.00 cm/s MV E velocity: 57.80 cm/s MV A velocity: 50.10 cm/s  SHUNTS MV E/A ratio:  1.15        Systemic Diam: 2.10 cm Lyman Bishop MD Electronically signed by Lyman Bishop MD Signature Date/Time: 12/02/2020/2:35:34 PM    Final    XR FEMUR MIN 2 VIEWS LEFT  Result Date: 11/16/2020 AP lateral left femur reviewed.  Intramedullary nail transfixes subtrochanteric femur fracture.  Not much callus formation present in the upper subtrochanteric region.  No lucencies or loosening of the hardware.  No screw breakage.  [place] EKG SITE RITE  Result Date: 12/02/2020 If Site Rite image not attached, placement could not be confirmed due to current cardiac rhythm.   Labs:  CBC: Recent Labs    11/05/2020 1801 12/02/20 0220 12/03/20 0700 12/04/20 0500  WBC 7.2 5.8 5.9 4.3  HGB 12.3* 11.9* 11.5* 8.7*  HCT 40.7 40.2 39.2 30.2*  PLT 325 257 286 172    COAGS: Recent Labs    10/03/20 0108  INR 1.3*    BMP: Recent Labs    11/24/2020 1801 12/02/20 0220 12/03/20 0700 12/04/20 0500  NA 139  140 145 144  K 4.5 4.3 4.0 3.0*  CL 104 105 111 116*  CO2 22 23 22  21*  GLUCOSE 93 90 123* 117*  BUN 59* 55* 48* 41*  CALCIUM 14.7* 14.1* 12.3* 8.8*  CREATININE 1.91* 1.69* 1.32* 0.95  GFRNONAA 39* 45* >60 >60    LIVER FUNCTION TESTS: Recent Labs    11/30/2020 1801 12/02/20 0220 12/03/20 0700 12/04/20 0500  BILITOT 0.9 0.9 0.8 0.4  AST 17 16 16  12*  ALT 14 14 15 10   ALKPHOS 72 70 59 42  PROT 5.7* 5.6* 5.5* 4.1*  ALBUMIN 2.9* 2.9* 3.0* 2.1*    TUMOR MARKERS: No results for input(s): AFPTM, CEA, CA199, CHROMGRNA in the last 8760 hours.  Assessment and Plan:  Lymphoma  Will proceed with image guided biopsy tomorrow as IR schedule allows.  Risks and benefits of biopsy was discussed with the patient and/or patient's family including, but not limited to bleeding, infection, damage to adjacent structures or low yield requiring additional tests.  All of the questions were answered and there is agreement to proceed.  Consent signed and in chart.  Thank you for this interesting consult.  I greatly enjoyed meeting Jaxon Flatt and look forward to participating in their care.  A copy of this report was sent to the requesting provider on this date.  Electronically Signed: Murrell Redden, PA-C   12/04/2020, 11:52 AM      I spent a total of 40 Minutes in face to face in clinical consultation, greater than 50% of which was counseling/coordinating care for iliac mass biopsy.

## 2020-12-04 NOTE — Progress Notes (Signed)
Andrew Dominguez   DOB:[birth year]   SA#:[id number]   NAT#:[id number]  Subjective:   He is awake, able to converse, feels tired, no other complaints. He tolerated chemo well yesterday Will start rituximab today.  Objective:  Vitals:   12/04/20 0950 12/04/20 1205  BP: 99/68 95/62  Pulse: (!) 108 87  Resp: 17 17  Temp: 98.8 F (37.1 C) 97.7 F (36.5 C)  SpO2: 95% 96%    Body mass index is 39.62 kg/m.  Intake/Output Summary (Last 24 hours) at 12/04/2020 1307 Last data filed at 12/04/2020 0650 Gross per 24 hour  Intake 2040 ml  Output -  Net 2040 ml    He is awake and able to converse. Sclerae clear.  Abdomen benign MSK no focal spinal tenderness, no peripheral edem Neuro nonfocal  CBG (last 3)  Recent Labs    12/03/20 1811 12/03/20 2213 12/04/20 0742  GLUCAP 114* 158* 114*    Labs:  Lab Results  Component Value Date   WBC 4.3 12/04/2020   HGB 8.7 (L) 12/04/2020   HCT 30.2 (L) 12/04/2020   MCV 84.1 12/04/2020   PLT 172 12/04/2020   NEUTROABS 3.5 12/04/2020    Urine Studies No results for input(s): UHGB, CRYS in the last 72 hours.  Invalid input(s): UACOL, UAPR, USPG, UPH, UTP, UGL, UKET, UBIL, UNIT, UROB, ULEU, UEPI, UWBC, [place], [place], [place], [place], [place]  Basic Metabolic Panel: Recent Labs  Lab 11/06/2020 1801 12/02/20 0220 12/03/20 0700 12/04/20 0500  NA 139 140 145 144  K 4.5 4.3 4.0 3.0*  CL 104 105 111 116*  CO2 22 23 22  21*  GLUCOSE 93 90 123* 117*  BUN 59* 55* 48* 41*  CREATININE 1.91* 1.69* 1.32* 0.95  CALCIUM 14.7* 14.1* 12.3* 8.8*  MG  --   --  1.7 1.3*  PHOS 3.7  --  3.2 2.6   GFR Estimated Creatinine Clearance: 110.7 mL/min (by C-G formula based on SCr of 0.95 mg/dL). Liver Function Tests: Recent Labs  Lab 11/26/2020 1801 12/02/20 0220 12/03/20 0700 12/04/20 0500  AST 17 16 16  12*  ALT 14 14 15 10   ALKPHOS 72 70 59 42  BILITOT 0.9 0.9 0.8 0.4  PROT 5.7* 5.6* 5.5* 4.1*  ALBUMIN 2.9* 2.9* 3.0* 2.1*   No results for input(s):  LIPASE, AMYLASE in the last 168 hours. No results for input(s): AMMONIA in the last 168 hours. Coagulation profile No results for input(s): INR, PROTIME in the last 168 hours.  CBC: Recent Labs  Lab 12/04/2020 1801 12/02/20 0220 12/03/20 0700 12/04/20 0500  WBC 7.2 5.8 5.9 4.3  NEUTROABS  --   --  4.6 3.5  HGB 12.3* 11.9* 11.5* 8.7*  HCT 40.7 40.2 39.2 30.2*  MCV 79.5* 80.1 81.5 84.1  PLT 325 257 286 172   Cardiac Enzymes: No results for input(s): CKTOTAL, CKMB, CKMBINDEX, TROPONINI in the last 168 hours. BNP: Invalid input(s): POCBNP CBG: Recent Labs  Lab 12/03/20 0746 12/03/20 1205 12/03/20 1811 12/03/20 2213 12/04/20 0742  GLUCAP 105* 150* 114* 158* 114*   D-Dimer No results for input(s): DDIMER in the last 72 hours. Hgb A1c Recent Labs    12/02/20 0220  HGBA1C 5.0   Lipid Profile No results for input(s): CHOL, HDL, LDLCALC, TRIG, CHOLHDL, LDLDIRECT in the last 72 hours. Thyroid function studies No results for input(s): TSH, T4TOTAL, T3FREE, THYROIDAB in the last 72 hours.  Invalid input(s): FREET3 Anemia work up No results for input(s): VITAMINB12, FOLATE, FERRITIN, TIBC, IRON, RETICCTPCT in  the last 72 hours. Microbiology Recent Results (from the past 240 hour(s))  SARS CORONAVIRUS 2 (TAT 6-24 HRS) Nasopharyngeal Nasopharyngeal Swab     Status: None   Collection Time: 11/17/2020  6:01 PM   Specimen: Nasopharyngeal Swab  Result Value Ref Range Status   SARS Coronavirus 2 NEGATIVE NEGATIVE Final    Comment: (NOTE) SARS-CoV-2 target nucleic acids are NOT DETECTED.  The SARS-CoV-2 RNA is generally detectable in upper and lower respiratory specimens during the acute phase of infection. Negative results do not preclude SARS-CoV-2 infection, do not rule out co-infections with other pathogens, and should not be used as the sole basis for treatment or other patient management decisions. Negative results must be combined with clinical observations, patient  history, and epidemiological information. The expected result is Negative.  Fact Sheet for Patients: SugarRoll.be  Fact Sheet for Healthcare Providers: https://www.woods-mathews.com/  This test is not yet approved or cleared by the [place] FDA and  has been authorized for detection and/or diagnosis of SARS-CoV-2 by FDA under an Emergency Use Authorization (EUA). This EUA will remain  in effect (meaning this test can be used) for the duration of the COVID-19 declaration under Se ction 564(b)(1) of the Act, 21 U.S.C. section 360bbb-3(b)(1), unless the authorization is terminated or revoked sooner.  Performed at [facility], [place] [address], [place], [place] [address]   SARS Coronavirus 2 by RT PCR (hospital order, performed in [facility] hospital lab) Nasopharyngeal Nasopharyngeal Swab     Status: None   Collection Time: 12/02/20  1:49 AM   Specimen: Nasopharyngeal Swab  Result Value Ref Range Status   SARS Coronavirus 2 NEGATIVE NEGATIVE Final    Comment: (NOTE) SARS-CoV-2 target nucleic acids are NOT DETECTED.  The SARS-CoV-2 RNA is generally detectable in upper and lower respiratory specimens during the acute phase of infection. The lowest concentration of SARS-CoV-2 viral copies this assay can detect is 250 copies / mL. A negative result does not preclude SARS-CoV-2 infection and should not be used as the sole basis for treatment or other patient management decisions.  A negative result may occur with improper specimen collection / handling, submission of specimen other than nasopharyngeal swab, presence of viral mutation(s) within the areas targeted by this assay, and inadequate number of viral copies (<250 copies / mL). A negative result must be combined with clinical observations, patient history, and epidemiological information.  Fact Sheet for Patients:   StrictlyIdeas.no  Fact Sheet for  Healthcare Providers: BankingDealers.co.za  This test is not yet approved or  cleared by the [place] FDA and has been authorized for detection and/or diagnosis of SARS-CoV-2 by FDA under an Emergency Use Authorization (EUA).  This EUA will remain in effect (meaning this test can be used) for the duration of the COVID-19 declaration under Section 564(b)(1) of the Act, 21 U.S.C. section 360bbb-3(b)(1), unless the authorization is terminated or revoked sooner.  Performed at [facility], [place] [address], [place],  [address]       Studies:  CT CHEST W CONTRAST  Result Date: 12/02/2020 CLINICAL DATA:  Diffuse large B-cell lymphoma restaging EXAM: CT CHEST, ABDOMEN, AND PELVIS WITH CONTRAST TECHNIQUE: Multidetector CT imaging of the chest, abdomen and pelvis was performed following the standard protocol during bolus administration of intravenous contrast. CONTRAST:  85mL OMNIPAQUE IOHEXOL 300 MG/ML  SOLN COMPARISON:  09/01/2020 FINDINGS: CT CHEST FINDINGS Cardiovascular: Right PICC line tip: SVC Coronary, aortic arch, and branch vessel atherosclerotic vascular disease. Mediastinum/Nodes: Distal  paraesophageal lymph node 1.2 cm in short axis on image 45 series 2, previously 0.5 cm. Pericardial lymph node 1.3 cm in short axis on image 43 series 2, previously not well seen. Lungs/Pleura: Large right and small left pleural effusions with passive atelectasis. Progressive scattered primarily sub solid pulmonary nodules are observed. For example, a left apical nodule is now mostly solid, with a hazy rim of sub solid opacity, measuring 3.0 by 2 point 3 cm on image 18 of series 4, previously completely sub solid measuring 1.3 by 1.0 cm. Some of the scattered nodules are similar to prior and others likewise demonstrate enlargement including the sub solid 2.9 by 2.3 cm right upper lobe nodule on image 44 of series 4 which was previously 1.3 by 0.9 cm.  Pleural-based nodularity along the left hemidiaphragm is observed. Musculoskeletal: Thoracic spondylosis. CT ABDOMEN PELVIS FINDINGS Hepatobiliary: Hepatomegaly with widespread masses within along the liver and possibly infiltrative component. Lateral segment left hepatic lobe hypodensity currently 15.7 by 11.1 cm, formerly 6.3 by 5.4 cm. Lesion posteriorly in the right hepatic lobe measuring 10.1 by 8.1 cm, formerly 7.0 by 6.2 cm. Multiple new hypodense lesions are present in the liver. Small gallstones are present in the gallbladder. Pancreas: Unremarkable Spleen: Large hypodense mass of the upper spleen measuring 11.9 by 11.3 cm, formerly 12.5 by 10.0 cm, roughly stable. However, there is new nodularity tracking along the inferomedial spleen margin including a 2.5 by 1.9 cm lesion on image 72 of series 2 which previously measured 1.1 cm in diameter. Adrenals/Urinary Tract: Small hypodense mass of the lateral limb left adrenal gland is probably a myelolipoma. This appears unchanged. Hypodense exophytic 1.9 by 1.7 cm left kidney upper pole cyst noted. Urinary bladder unremarkable. Stomach/Bowel: There are some scattered diverticula of the descending and sigmoid colon. Vascular/Lymphatic: Aortoiliac atherosclerotic vascular disease. Retrocrural node 1.3 cm in short axis on image 60 series 2, formerly 0.6 cm. Conglomerate adenopathy near the left adrenal gland measures about 3.9 by 5.3 cm on image 62 of series 2, formerly measured at 5.1 by 3.1 cm. Reproductive: Unremarkable Other: Mild perihepatic ascites. Trace edema along the omentum. Small amount of pelvic ascites. Subcutaneous edema along the flanks. Musculoskeletal: The lytic lesion of the left posterior iliac bone is substantially increased in size, with large extraosseous component, measuring 11.7 by 8.2 cm on image 93 of series 2 (formerly 4.5 by 3.1 cm). Large lytic destructive mass of the left proximal femur noted in the vicinity of an IM rod, the mass now  has a large extraosseous component and on the lower most image measures about 12.0 by 9.5 cm, formerly 3.3 by 3.2 cm. There is also a lytic lesion in the right proximal femur starting to erode the cortex, measuring 3.3 by 2.4 cm, previously 2.8 by 1.9 cm. New mass in the left gluteus maximus muscle, 5.8 by 3.7 cm. Lower lumbar spondylosis and degenerative disc disease. IMPRESSION: 1. Overall significant progression of malignancy. There is enlargement of the widespread hepatic masses; new and enlarging sub solid pulmonary nodules; new and enlarging thoracic and abdominal adenopathy; enlarging lytic destructive lesions of the proximal femurs and left iliac bone; new mass in the left gluteus maximus muscle; and similar large mass in the spleen but with some new soft tissue nodules along the medial spleen. 2. Large right and small left pleural effusions with passive atelectasis. 3. Other imaging findings of potential clinical significance: Coronary, aortic arch, and branch vessel atherosclerotic vascular disease. Cholelithiasis. Mild perihepatic and pelvic ascites.  Subcutaneous edema along the flanks. Small left adrenal myelolipoma. Small amount of pelvic ascites. 4. Aortic atherosclerosis. Aortic Atherosclerosis (ICD10-I70.0). Electronically Signed   By: Van Clines M.D.   On: 12/02/2020 21:00   CT ABDOMEN PELVIS W CONTRAST  Result Date: 12/02/2020 CLINICAL DATA:  Diffuse large B-cell lymphoma restaging EXAM: CT CHEST, ABDOMEN, AND PELVIS WITH CONTRAST TECHNIQUE: Multidetector CT imaging of the chest, abdomen and pelvis was performed following the standard protocol during bolus administration of intravenous contrast. CONTRAST:  9mL OMNIPAQUE IOHEXOL 300 MG/ML  SOLN COMPARISON:  09/01/2020 FINDINGS: CT CHEST FINDINGS Cardiovascular: Right PICC line tip: SVC Coronary, aortic arch, and branch vessel atherosclerotic vascular disease. Mediastinum/Nodes: Distal paraesophageal lymph node 1.2 cm in short axis on  image 45 series 2, previously 0.5 cm. Pericardial lymph node 1.3 cm in short axis on image 43 series 2, previously not well seen. Lungs/Pleura: Large right and small left pleural effusions with passive atelectasis. Progressive scattered primarily sub solid pulmonary nodules are observed. For example, a left apical nodule is now mostly solid, with a hazy rim of sub solid opacity, measuring 3.0 by 2 point 3 cm on image 18 of series 4, previously completely sub solid measuring 1.3 by 1.0 cm. Some of the scattered nodules are similar to prior and others likewise demonstrate enlargement including the sub solid 2.9 by 2.3 cm right upper lobe nodule on image 44 of series 4 which was previously 1.3 by 0.9 cm. Pleural-based nodularity along the left hemidiaphragm is observed. Musculoskeletal: Thoracic spondylosis. CT ABDOMEN PELVIS FINDINGS Hepatobiliary: Hepatomegaly with widespread masses within along the liver and possibly infiltrative component. Lateral segment left hepatic lobe hypodensity currently 15.7 by 11.1 cm, formerly 6.3 by 5.4 cm. Lesion posteriorly in the right hepatic lobe measuring 10.1 by 8.1 cm, formerly 7.0 by 6.2 cm. Multiple new hypodense lesions are present in the liver. Small gallstones are present in the gallbladder. Pancreas: Unremarkable Spleen: Large hypodense mass of the upper spleen measuring 11.9 by 11.3 cm, formerly 12.5 by 10.0 cm, roughly stable. However, there is new nodularity tracking along the inferomedial spleen margin including a 2.5 by 1.9 cm lesion on image 72 of series 2 which previously measured 1.1 cm in diameter. Adrenals/Urinary Tract: Small hypodense mass of the lateral limb left adrenal gland is probably a myelolipoma. This appears unchanged. Hypodense exophytic 1.9 by 1.7 cm left kidney upper pole cyst noted. Urinary bladder unremarkable. Stomach/Bowel: There are some scattered diverticula of the descending and sigmoid colon. Vascular/Lymphatic: Aortoiliac atherosclerotic  vascular disease. Retrocrural node 1.3 cm in short axis on image 60 series 2, formerly 0.6 cm. Conglomerate adenopathy near the left adrenal gland measures about 3.9 by 5.3 cm on image 62 of series 2, formerly measured at 5.1 by 3.1 cm. Reproductive: Unremarkable Other: Mild perihepatic ascites. Trace edema along the omentum. Small amount of pelvic ascites. Subcutaneous edema along the flanks. Musculoskeletal: The lytic lesion of the left posterior iliac bone is substantially increased in size, with large extraosseous component, measuring 11.7 by 8.2 cm on image 93 of series 2 (formerly 4.5 by 3.1 cm). Large lytic destructive mass of the left proximal femur noted in the vicinity of an IM rod, the mass now has a large extraosseous component and on the lower most image measures about 12.0 by 9.5 cm, formerly 3.3 by 3.2 cm. There is also a lytic lesion in the right proximal femur starting to erode the cortex, measuring 3.3 by 2.4 cm, previously 2.8 by 1.9 cm. New mass in the  left gluteus maximus muscle, 5.8 by 3.7 cm. Lower lumbar spondylosis and degenerative disc disease. IMPRESSION: 1. Overall significant progression of malignancy. There is enlargement of the widespread hepatic masses; new and enlarging sub solid pulmonary nodules; new and enlarging thoracic and abdominal adenopathy; enlarging lytic destructive lesions of the proximal femurs and left iliac bone; new mass in the left gluteus maximus muscle; and similar large mass in the spleen but with some new soft tissue nodules along the medial spleen. 2. Large right and small left pleural effusions with passive atelectasis. 3. Other imaging findings of potential clinical significance: Coronary, aortic arch, and branch vessel atherosclerotic vascular disease. Cholelithiasis. Mild perihepatic and pelvic ascites. Subcutaneous edema along the flanks. Small left adrenal myelolipoma. Small amount of pelvic ascites. 4. Aortic atherosclerosis. Aortic Atherosclerosis  (ICD10-I70.0). Electronically Signed   By: Van Clines M.D.   On: 12/02/2020 21:00   DG CHEST PORT 1 VIEW  Result Date: 12/02/2020 CLINICAL DATA:  Right PICC placement. EXAM: PORTABLE CHEST 1 VIEW COMPARISON:  Most recent radiograph 09/27/2020, CT 09/01/2020 FINDINGS: Right upper extremity PICC tip projects over the upper SVC. Layering density in the right hemithorax consistent with pleural effusion. Mild volume loss in the right hemithorax suggest underlying atelectasis. There is no pneumothorax. Minor atelectasis at the left lung base. The heart is normal in size. No large left pleural effusion. IMPRESSION: 1. Right upper extremity PICC tip projects over the upper SVC. 2. Layering right pleural effusion, at least moderate in size, with likely associated atelectasis. Electronically Signed   By: Keith Rake M.D.   On: 12/02/2020 17:17   ECHOCARDIOGRAM COMPLETE  Result Date: 12/02/2020    ECHOCARDIOGRAM REPORT   Patient Name:   XYON LUKASIK Date of Exam: 12/02/2020 Medical Rec #:  [id number]          Height:       72.0 in Accession #:    [id number]         Weight:       292.1 lb Date of Birth:  November 22, 1955          BSA:          2.503 m Patient Age:    13 years           BP:           97/66 mmHg Patient Gender: M                  HR:           96 bpm. Exam Location:  Inpatient Procedure: 2D Echo and Intracardiac Opacification Agent Indications:    Chemo Z09  History:        Patient has no prior history of Echocardiogram examinations.                 Arrythmias:Atrial Fibrillation, Signs/Symptoms:Fever; Risk                 Factors:Former Smoker, Diabetes, Dyslipidemia and Hypertension.  Sonographer:    Leavy Cella Referring Phys: [place]  Sonographer Comments: Technically difficult study due to poor echo windows. IMPRESSIONS  1. Left ventricular ejection fraction, by estimation, is 65 to 70%. The left ventricle has normal function. The left ventricle has no regional wall  motion abnormalities. There is mild left ventricular hypertrophy. Left ventricular diastolic parameters were normal.  2. Right ventricular systolic function is normal. The right ventricular size is normal. There is normal pulmonary artery systolic pressure.  3. The  mitral valve is grossly normal. No evidence of mitral valve regurgitation.  4. The aortic valve is tricuspid. Aortic valve regurgitation is not visualized.  5. The inferior vena cava is normal in size with greater than 50% respiratory variability, suggesting right atrial pressure of 3 mmHg. Comparison(s): No prior Echocardiogram. FINDINGS  Left Ventricle: Left ventricular ejection fraction, by estimation, is 65 to 70%. The left ventricle has normal function. The left ventricle has no regional wall motion abnormalities. Definity contrast agent was given IV to delineate the left ventricular  endocardial borders. The left ventricular internal cavity size was normal in size. There is mild left ventricular hypertrophy. Left ventricular diastolic parameters were normal. Right Ventricle: The right ventricular size is normal. No increase in right ventricular wall thickness. Right ventricular systolic function is normal. There is normal pulmonary artery systolic pressure. The tricuspid regurgitant velocity is 2.71 m/s, and  with an assumed right atrial pressure of 3 mmHg, the estimated right ventricular systolic pressure is 26.9 mmHg. Left Atrium: Left atrial size was normal in size. Right Atrium: Right atrial size was normal in size. Pericardium: There is no evidence of pericardial effusion. Mitral Valve: The mitral valve is grossly normal. No evidence of mitral valve regurgitation. Tricuspid Valve: The tricuspid valve is grossly normal. Tricuspid valve regurgitation is trivial. Aortic Valve: The aortic valve is tricuspid. Aortic valve regurgitation is not visualized. Pulmonic Valve: The pulmonic valve was normal in structure. Pulmonic valve regurgitation is not  visualized. Aorta: The aortic root and ascending aorta are structurally normal, with no evidence of dilitation. Venous: The inferior vena cava is normal in size with greater than 50% respiratory variability, suggesting right atrial pressure of 3 mmHg. IAS/Shunts: No atrial level shunt detected by color flow Doppler.  LEFT VENTRICLE PLAX 2D LVIDd:         4.40 cm  Diastology LVIDs:         2.60 cm  LV e' medial:    11.60 cm/s LV PW:         1.40 cm  LV E/e' medial:  5.0 LV IVS:        1.10 cm  LV e' lateral:   19.00 cm/s LVOT diam:     2.10 cm  LV E/e' lateral: 3.0 LVOT Area:     3.46 cm  RIGHT VENTRICLE RV S prime:     25.80 cm/s TAPSE (M-mode): 1.8 cm LEFT ATRIUM             Index       RIGHT ATRIUM           Index LA diam:        2.60 cm 1.04 cm/m  RA Area:     13.60 cm LA Vol (A2C):   28.6 ml 11.43 ml/m RA Volume:   35.20 ml  14.06 ml/m LA Vol (A4C):   26.1 ml 10.43 ml/m LA Biplane Vol: 29.0 ml 11.59 ml/m   AORTA Ao Root diam: 3.00 cm MITRAL VALVE               TRICUSPID VALVE MV Area (PHT): 3.42 cm    TR Peak grad:   29.4 mmHg MV Decel Time: 222 msec    TR Vmax:        271.00 cm/s MV E velocity: 57.80 cm/s MV A velocity: 50.10 cm/s  SHUNTS MV E/A ratio:  1.15        Systemic Diam: 2.10 cm Lyman Bishop MD Electronically signed by Lyman Bishop MD Signature  Date/Time: 12/02/2020/2:35:34 PM    Final     Assessment: 65 y.o.male patient with 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 We have consulted with his primary oncologist who agreed that it is important to start chemotherapy as soon as possible, and also requested another biopsy to run some additional testing. We will start with R CHOP which is a universally acceptable treatment in large B cell lymphomas despite presence of absence of double/triple hit lymphomas. C1D1 CHOP 12/03/2020,  rituximab today. ECHO, EF satisfactory to proceed I discussed adverse effects of chemotherapy with patient and MPOA. Since patient was confused MPOA gave [place] permission to proceed. Verbal consent given over phone and my NP Mikey Bussing was present at the time of discussion. Discussed about common side effects such as fatigue, nausea, vomiting,diarrhea, increased risk of infections, cardiotoxicity, neuropathy, and alopecia. He says, he has to try his best.   Please monitor CBC, CMP, LDH, Mg, Uric acid, phosphorus daily. Uric acid normalized. We will start him on filgrastim from Monday, approximately 24 hrs after chemotherapy administration. Plan for 5 days Hypercalcemia also improved significantly. He will FU with Dr Eual Fines once he is discharged.  Benay Pike, MD 12/04/2020  1:07 PM

## 2020-12-05 ENCOUNTER — Inpatient Hospital Stay (HOSPITAL_COMMUNITY): Payer: 59

## 2020-12-05 DIAGNOSIS — A419 Sepsis, unspecified organism: Secondary | ICD-10-CM | POA: Diagnosis not present

## 2020-12-05 DIAGNOSIS — Z7189 Other specified counseling: Secondary | ICD-10-CM | POA: Diagnosis not present

## 2020-12-05 DIAGNOSIS — C8338 Diffuse large B-cell lymphoma, lymph nodes of multiple sites: Secondary | ICD-10-CM

## 2020-12-05 DIAGNOSIS — Z515 Encounter for palliative care: Secondary | ICD-10-CM

## 2020-12-05 DIAGNOSIS — Z9911 Dependence on respirator [ventilator] status: Secondary | ICD-10-CM | POA: Diagnosis not present

## 2020-12-05 DIAGNOSIS — C787 Secondary malignant neoplasm of liver and intrahepatic bile duct: Secondary | ICD-10-CM | POA: Diagnosis not present

## 2020-12-05 DIAGNOSIS — I4891 Unspecified atrial fibrillation: Secondary | ICD-10-CM | POA: Diagnosis not present

## 2020-12-05 DIAGNOSIS — R6521 Severe sepsis with septic shock: Secondary | ICD-10-CM

## 2020-12-05 LAB — GLUCOSE, CAPILLARY
Glucose-Capillary: 119 mg/dL — ABNORMAL HIGH (ref 70–99)
Glucose-Capillary: 122 mg/dL — ABNORMAL HIGH (ref 70–99)
Glucose-Capillary: 96 mg/dL (ref 70–99)
Glucose-Capillary: 119 mg/dL — ABNORMAL HIGH (ref 70–99)
Glucose-Capillary: 121 mg/dL — ABNORMAL HIGH (ref 70–99)

## 2020-12-05 LAB — LACTIC ACID, PLASMA
Lactic Acid, Venous: 1.8 mmol/L (ref 0.5–1.9)
Lactic Acid, Venous: 1.9 mmol/L (ref 0.5–1.9)
Lactic Acid, Venous: 1.5 mmol/L (ref 0.5–1.9)
Lactic Acid, Venous: 2.1 mmol/L (ref 0.5–1.9)

## 2020-12-05 LAB — COMPREHENSIVE METABOLIC PANEL
ALT: 16 U/L (ref 0–44)
AST: 21 U/L (ref 15–41)
Albumin: 2.4 g/dL — ABNORMAL LOW (ref 3.5–5.0)
Alkaline Phosphatase: 50 U/L (ref 38–126)
Anion gap: 9 (ref 5–15)
BUN: 45 mg/dL — ABNORMAL HIGH (ref 8–23)
CO2: 20 mmol/L — ABNORMAL LOW (ref 22–32)
Calcium: 9.1 mg/dL (ref 8.9–10.3)
Chloride: 112 mmol/L — ABNORMAL HIGH (ref 98–111)
Creatinine, Ser: 1.12 mg/dL (ref 0.61–1.24)
GFR, Estimated: >60 mL/min (ref >60)
Glucose, Bld: 141 mg/dL — ABNORMAL HIGH (ref 70–99)
Potassium: 4.4 mmol/L (ref 3.5–5.1)
Sodium: 141 mmol/L (ref 135–145)
Total Bilirubin: 0.4 mg/dL (ref 0.3–1.2)
Total Protein: 4.9 g/dL — ABNORMAL LOW (ref 6.5–8.1)

## 2020-12-05 LAB — PROTIME-INR
INR: 1.3 — ABNORMAL HIGH (ref 0.8–1.2)
Prothrombin Time: 16.1 seconds — ABNORMAL HIGH (ref 11.4–15.2)

## 2020-12-05 LAB — BLOOD GAS, ARTERIAL
Acid-base deficit: 7.6 mmol/L — ABNORMAL HIGH (ref 0.0–2.0)
Bicarbonate: 17.4 mmol/L — ABNORMAL LOW (ref 20.0–28.0)
Drawn by: 25788
FIO2: 32
O2 Content: 3 L/min
O2 Saturation: 90.5 %
Patient temperature: 102.3
pCO2 arterial: 35.4 mmHg (ref 32.0–48.0)
pH, Arterial: 7.313 — ABNORMAL LOW (ref 7.350–7.450)
pO2, Arterial: 66.7 mmHg — ABNORMAL LOW (ref 83.0–108.0)

## 2020-12-05 LAB — CBC WITH DIFFERENTIAL/PLATELET
Abs Immature Granulocytes: 0.03 10*3/uL (ref 0.00–0.07)
Basophils Absolute: 0 10*3/uL (ref 0.0–0.1)
Basophils Relative: 0 %
Eosinophils Absolute: 0 10*3/uL (ref 0.0–0.5)
Eosinophils Relative: 0 %
HCT: 38.4 % — ABNORMAL LOW (ref 39.0–52.0)
Hemoglobin: 11.2 g/dL — ABNORMAL LOW (ref 13.0–17.0)
Immature Granulocytes: 1 %
Lymphocytes Relative: 6 %
Lymphs Abs: 0.3 10*3/uL — ABNORMAL LOW (ref 0.7–4.0)
MCH: 23.7 pg — ABNORMAL LOW (ref 26.0–34.0)
MCHC: 29.2 g/dL — ABNORMAL LOW (ref 30.0–36.0)
MCV: 81.2 fL (ref 80.0–100.0)
Monocytes Absolute: 0.1 10*3/uL (ref 0.1–1.0)
Monocytes Relative: 2 %
Neutro Abs: 4.5 10*3/uL (ref 1.7–7.7)
Neutrophils Relative %: 91 %
Platelets: 196 10*3/uL (ref 150–400)
RBC: 4.73 MIL/uL (ref 4.22–5.81)
RDW: 16.9 % — ABNORMAL HIGH (ref 11.5–15.5)
WBC: 5 10*3/uL (ref 4.0–10.5)
nRBC: 0 % (ref 0.0–0.2)

## 2020-12-05 LAB — URIC ACID: Uric Acid, Serum: 4.5 mg/dL (ref 3.7–8.6)

## 2020-12-05 LAB — LACTATE DEHYDROGENASE: LDH: 448 U/L — ABNORMAL HIGH (ref 98–192)

## 2020-12-05 LAB — BASIC METABOLIC PANEL
Anion gap: 8 (ref 5–15)
BUN: 57 mg/dL — ABNORMAL HIGH (ref 8–23)
CO2: 18 mmol/L — ABNORMAL LOW (ref 22–32)
Calcium: 8.8 mg/dL — ABNORMAL LOW (ref 8.9–10.3)
Chloride: 117 mmol/L — ABNORMAL HIGH (ref 98–111)
Creatinine, Ser: 1.1 mg/dL (ref 0.61–1.24)
GFR, Estimated: >60 mL/min (ref >60)
Glucose, Bld: 145 mg/dL — ABNORMAL HIGH (ref 70–99)
Potassium: 5.3 mmol/L — ABNORMAL HIGH (ref 3.5–5.1)
Sodium: 143 mmol/L (ref 135–145)

## 2020-12-05 LAB — CORTISOL: Cortisol, Plasma: 50.8 ug/dL

## 2020-12-05 LAB — MAGNESIUM: Magnesium: 1.6 mg/dL — ABNORMAL LOW (ref 1.7–2.4)

## 2020-12-05 LAB — HEPARIN LEVEL (UNFRACTIONATED): Heparin Unfractionated: 0.75 IU/mL — ABNORMAL HIGH (ref 0.30–0.70)

## 2020-12-05 LAB — MRSA PCR SCREENING: MRSA by PCR: NEGATIVE

## 2020-12-05 LAB — PHOSPHORUS: Phosphorus: 2.9 mg/dL (ref 2.5–4.6)

## 2020-12-05 MED ORDER — HEPARIN (PORCINE) 25000 UT/250ML-% IV SOLN
1500.0000 [IU]/h | INTRAVENOUS | Status: DC
  Administered 2020-12-05: 1500 [IU]/h via INTRAVENOUS
  Filled 2020-12-05: qty 250

## 2020-12-05 MED ORDER — LACTATED RINGERS IV SOLN: INTRAVENOUS | Status: DC

## 2020-12-05 MED ORDER — SODIUM CHLORIDE 0.9 % IV BOLUS
1000.0000 mL | Freq: Once | INTRAVENOUS | Status: AC
  Administered 2020-12-05: 1000 mL via INTRAVENOUS

## 2020-12-05 MED ORDER — ACETAMINOPHEN 650 MG RE SUPP
650.0000 mg | RECTAL | Status: DC | PRN
  Administered 2020-12-05: 650 mg via RECTAL

## 2020-12-05 MED ORDER — ACETAMINOPHEN 325 MG PO TABS
650.0000 mg | ORAL_TABLET | Freq: Four times a day (QID) | ORAL | Status: DC | PRN
  Administered 2020-12-05: 650 mg via ORAL
  Filled 2020-12-05 (×2): qty 2

## 2020-12-05 MED ORDER — HEPARIN BOLUS VIA INFUSION
5000.0000 [IU] | Freq: Once | INTRAVENOUS | Status: AC
  Administered 2020-12-05: 5000 [IU] via INTRAVENOUS
  Filled 2020-12-05: qty 5000

## 2020-12-05 MED ORDER — NOREPINEPHRINE 4 MG/250ML-% IV SOLN
0.0000 ug/min | INTRAVENOUS | Status: DC
  Administered 2020-12-05: 11 ug/min via INTRAVENOUS
  Administered 2020-12-05: 2 ug/min via INTRAVENOUS
  Administered 2020-12-06: 21 ug/min via INTRAVENOUS
  Administered 2020-12-06: 40 ug/min via INTRAVENOUS
  Administered 2020-12-06: 18 ug/min via INTRAVENOUS
  Filled 2020-12-05 (×8): qty 250

## 2020-12-05 MED ORDER — MORPHINE SULFATE (PF) 4 MG/ML IV SOLN
4.0000 mg | INTRAVENOUS | Status: DC | PRN
Start: 2020-12-05 — End: 2020-12-06
  Administered 2020-12-05 (×2): 4 mg via INTRAVENOUS
  Filled 2020-12-05 (×2): qty 1

## 2020-12-05 MED ORDER — NOREPINEPHRINE 4 MG/250ML-% IV SOLN: 2.0000 ug/min | INTRAVENOUS | Status: DC

## 2020-12-05 MED ORDER — METOPROLOL TARTRATE 5 MG/5ML IV SOLN
5.0000 mg | INTRAVENOUS | Status: AC | PRN
  Administered 2020-12-05 (×2): 5 mg via INTRAVENOUS
  Filled 2020-12-05 (×2): qty 5

## 2020-12-05 MED ORDER — CHLORHEXIDINE GLUCONATE CLOTH 2 % EX PADS
6.0000 | MEDICATED_PAD | Freq: Every day | CUTANEOUS | Status: DC
  Administered 2020-12-05 (×2): 6 via TOPICAL

## 2020-12-05 MED ORDER — DILTIAZEM HCL 25 MG/5ML IV SOLN
5.0000 mg | Freq: Once | INTRAVENOUS | Status: AC
  Administered 2020-12-05: 5 mg via INTRAVENOUS
  Filled 2020-12-05: qty 5

## 2020-12-05 MED ORDER — VANCOMYCIN HCL IN DEXTROSE 1-5 GM/200ML-% IV SOLN
1000.0000 mg | Freq: Two times a day (BID) | INTRAVENOUS | Status: DC
  Administered 2020-12-06: 1000 mg via INTRAVENOUS
  Filled 2020-12-05: qty 200

## 2020-12-05 MED ORDER — SODIUM CHLORIDE 0.9 % IV SOLN: INTRAVENOUS | Status: DC | PRN

## 2020-12-05 MED ORDER — ORAL CARE MOUTH RINSE
15.0000 mL | Freq: Two times a day (BID) | OROMUCOSAL | Status: DC
  Administered 2020-12-05 – 2020-12-06 (×3): 15 mL via OROMUCOSAL

## 2020-12-05 MED ORDER — VANCOMYCIN HCL 10 G IV SOLR
2500.0000 mg | Freq: Once | INTRAVENOUS | Status: AC
  Administered 2020-12-05: 2500 mg via INTRAVENOUS
  Filled 2020-12-05: qty 2500

## 2020-12-05 MED ORDER — LACTATED RINGERS IV BOLUS
1000.0000 mL | Freq: Once | INTRAVENOUS | Status: AC
  Administered 2020-12-05: 1000 mL via INTRAVENOUS

## 2020-12-05 MED ORDER — SODIUM ZIRCONIUM CYCLOSILICATE 10 G PO PACK
10.0000 g | PACK | Freq: Once | ORAL | Status: AC
  Administered 2020-12-05: 10 g via ORAL
  Filled 2020-12-05: qty 1

## 2020-12-05 MED ORDER — ACETAMINOPHEN 325 MG PO TABS: 650.0000 mg | ORAL_TABLET | ORAL | Status: DC | PRN

## 2020-12-05 MED ORDER — DIGOXIN 0.25 MG/ML IJ SOLN
0.5000 mg | Freq: Once | INTRAMUSCULAR | Status: AC
  Administered 2020-12-05: 0.5 mg via INTRAVENOUS
  Filled 2020-12-05: qty 2

## 2020-12-05 MED ORDER — MAGNESIUM SULFATE 4 GM/100ML IV SOLN
4.0000 g | Freq: Once | INTRAVENOUS | Status: AC
  Administered 2020-12-05: 4 g via INTRAVENOUS
  Filled 2020-12-05: qty 100

## 2020-12-05 MED ORDER — HEPARIN (PORCINE) 25000 UT/250ML-% IV SOLN
1400.0000 [IU]/h | INTRAVENOUS | Status: DC
  Administered 2020-12-06: 1400 [IU]/h via INTRAVENOUS
  Filled 2020-12-05: qty 250

## 2020-12-05 MED ORDER — PIPERACILLIN-TAZOBACTAM 3.375 G IVPB
3.3750 g | Freq: Three times a day (TID) | INTRAVENOUS | Status: DC
  Administered 2020-12-05 – 2020-12-06 (×3): 3.375 g via INTRAVENOUS
  Filled 2020-12-05 (×3): qty 50

## 2020-12-05 MED ORDER — MORPHINE SULFATE (PF) 4 MG/ML IV SOLN
4.0000 mg | INTRAVENOUS | Status: DC | PRN
  Administered 2020-12-05: 4 mg via INTRAVENOUS
  Filled 2020-12-05: qty 1

## 2020-12-05 MED ORDER — STERILE WATER FOR INJECTION IV SOLN
INTRAVENOUS | Status: DC
  Filled 2020-12-05: qty 850
  Filled 2020-12-05 (×2): qty 150
  Filled 2020-12-05: qty 850

## 2020-12-05 MED ORDER — POTASSIUM CHLORIDE 10 MEQ/100ML IV SOLN
10.0000 meq | INTRAVENOUS | Status: AC
  Administered 2020-12-05 (×4): 10 meq via INTRAVENOUS
  Filled 2020-12-05 (×4): qty 100

## 2020-12-05 MED ORDER — SODIUM CHLORIDE 0.9 % IV SOLN
250.0000 mL | INTRAVENOUS | Status: DC
  Administered 2020-12-05: 250 mL via INTRAVENOUS

## 2020-12-05 MED ORDER — DILTIAZEM HCL-DEXTROSE 125-5 MG/125ML-% IV SOLN (PREMIX)
5.0000 mg/h | INTRAVENOUS | Status: DC
  Administered 2020-12-05: 5 mg/h via INTRAVENOUS
  Filled 2020-12-05: qty 125

## 2020-12-05 MED ORDER — ACETAMINOPHEN 650 MG RE SUPP
650.0000 mg | Freq: Four times a day (QID) | RECTAL | Status: DC | PRN
  Administered 2020-12-05: 650 mg via RECTAL
  Filled 2020-12-05 (×2): qty 1

## 2020-12-05 NOTE — Progress Notes (Signed)
PROGRESS NOTE  Andrew Dominguez  DOB: 08-Nov-1955  PCP: Baxter Hire, MD DZH:[id number]  DOA: 11/25/2020  LOS: 4 days   Chief Complaint  Patient presents with  . Abnormal Lab  . Fatigue    Brief narrative: Andrew Dominguez is a 65 y.o. male with PMH significant for DM2, HTN, HLD, A. fib on Eliquis, COPD, pulmonary nodules, lytic bone lesions suspected metastatic lung cancer, liver masses, splenic mass, and left kidney mass who was recently admitted in November 2021 for left hip fracture and underwent surgical fixation. Patient was sent to the ED by EMS from [facility] for worsening lethargy, abnormal calcium and hemoglobin levels.   Patient was admitted to hospitalist service.  Mental status improved in 24 hours after initiating treatment for hypercalcemia. Oncology was consulted and patient was started on chemotherapy on 1/29.  On the night of 1/30, patient became febrile, tachycardic, hypotensive.  He was moved to ICU stepdown.  Subjective: Patient was seen and examined this morning. Moved down to ICU stepdown last night.  Lethargic this morning.  Has recurrent fever.  Blood pressure remains low in 70s and 80s this morning.  Assessment/Plan: Sepsis syndrome -Since last night 1/30, patient has had multiple episodes of high-grade fever, persistently tachycardic and altered. -It is unclear at this time if there is a source of infection or if this is in response to chemotherapy. -Left hip area seems red, warm and swollen which raises suspicion of underlying infection. -Order for blood culture.  Started on broad-spectrum IV antibiotics. -Bolus given.  PCCM consulted.  May need pressors.  Already has a PICC line.  Left hip area cellulitis -Patient is status post left hip intramedullary nailing in November 2021 by Dr. Marlou Sa. Since then, he has been in SNF in a nonweightbearing status.   -Examination this morning shows red, warm, tender, swollen right proximal thigh.  Incision  seems intact without drainage.  Orthopedics consulted.  May need imaging. -Covered with broad-spectrum antibiotics for now.  A. fib with RVR -Patient has chronic A. Fib. -Was started on Cardizem drip last night. -Eliquis on hold for possible liver biopsy.  Currently on full dose Lovenox.  Hypercalcemia of malignancy -Presented with an elevated calcium level to 14.7.  Improved with forced diuresis with IV Lasix, IV fluid as less calcitonin.   -Calcium level is now in normal range. -Continue IV fluid for hypotension.  Stop Lasix -Calcium trend as below. Recent Labs  Lab 11/15/2020 1801 12/02/20 0220 12/03/20 0700 12/04/20 0500 12/05/20 0409  CALCIUM 14.7* 14.1* 12.3* 8.8* 9.1   Hyperuricemia -Uric acid level elevated at baseline due to malignancy.   -On admission it was 17.4, improving, significantly better at 4.5 today.  Rasburicase was given by oncology.  Large B-cell lymphoma -With evidence of widespread disease per CT scan from October 2021.  Biopsy from one of the bone lesions showed large B-cell lymphoma. -Oncology and IR consult appreciated. -Liver biopsy planned this admission.  Eliquis on hold. -Repeat CT scan of chest abdomen pelvis obtained on 1/28 which showed overall significant progression of malignancy with widespread hepatic masses, pulmonary nodules, thoracic and abdominal lymphadenopathy, enlarging lytic destructive lesions of the proximal femurs, iliac bone, gluteus maximus muscle.   -1/29, initiated systemic chemotherapy with R-CHOP regimen, Solu-Medrol 80 mg daily along with Protonix 40 mg daily. -Oncology to decide whether or not to continue chemotherapy because of ongoing sepsis syndrome.  Acute respiratory failure with hypoxia -On 2 L oxygen by nasal cannula. -CT scan also showed  large right and small left pleural effusion with passive atelectasis. -Right thoracentesis ordered.  Acute metabolic encephalopathy -Secondary to elevated calcium  level. -Improving.  Oriented to place and person.  Type 2 diabetes mellitus -Seems controlled at this time. A1c 5 on 1/28. -Sliding scale insulin with Accu-Cheks Recent Labs  Lab 12/04/20 0742 12/04/20 1221 12/04/20 1658 12/04/20 2206 12/05/20 0441  GLUCAP 114* 169* 140* 152* 119*   Obesity - Body mass index is 38.18 kg/m. Patient has been advised to make an attempt to improve diet and exercise patterns to aid in weight loss.  Bilateral lower extremity edema -Echo with EF 65 to 70%, normal pulmonary artery systolic pressure -Has 1+ bilateral lower extremity pedal edema.  Received aggressive hydration for hypercalcemia.  Needs aggressive hydration at this time for sepsis.  But once clinically stable, needs to be diuresed.    Impaired mobility Recent left hip fracture s/p ORIF - 09/2020 -PT eval ordered. -Patient states, per orthopedics, he is supposed to have nonweightbearing status on the left.  Mobility: PT eval Code Status:   Code Status: Full Code  Nutritional status: Body mass index is 38.18 kg/m. Nutrition Problem: Increased nutrient needs Etiology: cancer and cancer related treatments,chronic illness Signs/Symptoms: estimated needs Diet Order            Diet NPO time specified  Diet effective midnight                 DVT prophylaxis: Lovenox subcu.  Eliquis on hold   Antimicrobials:  None Fluid: Normal saline at 150 mill per hour Consultants: Oncology, IR, critical care Family Communication:  Not at bedside  Status is: Inpatient  Remains inpatient appropriate because: Sepsis, hypercalcemia, currently on chemotherapy Dispo: The patient is from: SNF              Anticipated d/c is to: SNF              Anticipated d/c date is: > 3 days              Patient currently is not medically stable to d/c.   Difficult to place patient No   Infusions:  . sodium chloride Stopped (12/05/20 0438)  . sodium chloride    . sodium chloride    . diltiazem (CARDIZEM)  infusion Stopped (12/05/20 0857)  . lactated ringers    . magnesium sulfate bolus IVPB    . piperacillin-tazobactam (ZOSYN)  IV 3.375 g (12/05/20 1016)  . potassium chloride 10 mEq (12/05/20 1016)  . vancomycin      Scheduled Meds: . (feeding supplement) PROSource Plus  30 mL Oral TID BM  . allopurinol  300 mg Oral Daily  . atenolol  50 mg Oral Daily  . B-complex with vitamin C   Oral Daily  . Chlorhexidine Gluconate Cloth  6 each Topical Daily  . Chlorhexidine Gluconate Cloth  6 each Topical Q0600  . enoxaparin (LOVENOX) injection  130 mg Subcutaneous BID  . feeding supplement (GLUCERNA SHAKE)  237 mL Oral TID BM  . fenofibrate  54 mg Oral Daily  . gabapentin  100 mg Oral TID  . insulin aspart  0-20 Units Subcutaneous TID WC  . insulin aspart  0-5 Units Subcutaneous QHS  . mouth rinse  15 mL Mouth Rinse BID  . methylPREDNISolone (SOLU-MEDROL) injection  80 mg Intravenous Daily  . pantoprazole  40 mg Intravenous QHS  . rosuvastatin  5 mg Oral Daily  . senna  1 tablet Oral QHS  .  sodium chloride flush  10-40 mL Intracatheter Q12H  . Tbo-Filgrastim  480 mcg Subcutaneous q1800  . umeclidinium-vilanterol  1 puff Inhalation Daily    Antimicrobials: Anti-infectives (From admission, onward)   Start     Dose/Rate Route Frequency Ordered Stop   12/05/20 1030  vancomycin (VANCOCIN) 2,500 mg in sodium chloride 0.9 % 500 mL IVPB        2,500 mg 250 mL/hr over 120 Minutes Intravenous  Once 12/05/20 0914     12/05/20 1000  piperacillin-tazobactam (ZOSYN) IVPB 3.375 g        3.375 g 12.5 mL/hr over 240 Minutes Intravenous Every 8 hours 12/05/20 0914        PRN meds: acetaminophen, albuterol, haloperidol lactate, morphine injection, ondansetron **OR** ondansetron (ZOFRAN) IV, oxyCODONE, sodium chloride flush   Objective: Vitals:   12/05/20 0830 12/05/20 1000  BP: (!) 74/43 (!) 89/64  Pulse: (!) 124 (!) 113  Resp: (!) 24 (!) 25  Temp: (!) 101.84 F (38.8 C) (!) 100.4 F (38 C)   SpO2: 92% 97%    Intake/Output Summary (Last 24 hours) at 12/05/2020 1058 Last data filed at 12/05/2020 1000 Gross per 24 hour  Intake 3915.01 ml  Output 55 ml  Net 3860.01 ml   Filed Weights   11/30/2020 2225 12/02/20 0421 12/05/20 0500  Weight: 132.5 kg 132.5 kg 127.7 kg   Weight change:  Body mass index is 38.18 kg/m.   Physical Exam: General exam: Elderly obese Caucasian male.  Lethargic this morning Skin: No rashes, lesions or ulcers. HEENT: Atraumatic, normocephalic, no obvious bleeding Lungs: Clear to auscultation bilaterally CVS: Irregular tachycardia, no murmur  GI/Abd soft, nontender, nondistended, bowel sound present CNS: Alert, awake, oriented to place and person Psychiatry: Mood appropriate Extremities: Bilateral 1-2+ pedal edema.  No calf tenderness  Data Review: I have personally reviewed the laboratory data and studies available.  Recent Labs  Lab 11/05/2020 1801 12/02/20 0220 12/03/20 0700 12/04/20 0500 12/05/20 0409  WBC 7.2 5.8 5.9 4.3 5.0  NEUTROABS  --   --  4.6 3.5 4.5  HGB 12.3* 11.9* 11.5* 8.7* 11.2*  HCT 40.7 40.2 39.2 30.2* 38.4*  MCV 79.5* 80.1 81.5 84.1 81.2  PLT 325 257 286 172 196   Recent Labs  Lab 11/09/2020 1801 12/02/20 0220 12/03/20 0700 12/04/20 0500 12/05/20 0409  NA 139 140 145 144 141  K 4.5 4.3 4.0 3.0* 4.4  CL 104 105 111 116* 112*  CO2 22 23 22  21* 20*  GLUCOSE 93 90 123* 117* 141*  BUN 59* 55* 48* 41* 45*  CREATININE 1.91* 1.69* 1.32* 0.95 1.12  CALCIUM 14.7* 14.1* 12.3* 8.8* 9.1  MG  --   --  1.7 1.3* 1.6*  PHOS 3.7  --  3.2 2.6 2.9    F/u labs ordered  Signed, Terrilee Croak, MD [facility] 12/05/2020

## 2020-12-05 NOTE — Progress Notes (Addendum)
eLink Physician-Brief Progress Note Patient Name: Andrew Dominguez DOB: January 06, 1956 MRN: [id number]   Date of Service  12/05/2020  HPI/Events of Note  Lactic acid = 1.5 --> 2.1 - LVEF = 65-70%.  eICU Interventions  Plan: 1. Bolus with 0.9 NaCl 1 liter IV over 1 hour now.      Intervention Category Major Interventions: Acid-Base disturbance - evaluation and management  Lysle Dingwall 12/05/2020, 11:37 PM

## 2020-12-05 NOTE — Consult Note (Signed)
ORTHOPAEDIC CONSULTATION  REQUESTING PHYSICIAN: Terrilee Croak, MD  Chief Complaint: Fever, altered mental status  HPI: Andrew Dominguez is a 65 y.o. male who presents with progressive change in mental status.  Responds to his name with grunt but does not answer questions.  Recent left hip fracture ORIF by Dr. Marlou Sa in November 2021 for pathologic fracture.  Recent follow-up with Dr. Marlou Sa showed lack of significant callus formation on radiographs so patient remained NWB. Began treatment for lymphoma yesterday and has had decline in mental status over the last 24 hours with fevers up to 102.9.    Past Medical History:  Diagnosis Date  . Anemia   . Arthritis    knees  . COPD (chronic obstructive pulmonary disease) ([place])   . Diabetes mellitus without complication ([place])   . Hyperlipemia   . Hypertension   . Hypertriglyceridemia   . Hypogonadism in male   . Numbness of foot    bilateral  . Obesity   . Tubular adenoma of colon   . Vitamin D deficiency    Past Surgical History:  Procedure Laterality Date  . CATARACT EXTRACTION W/PHACO Right 04/07/2019   Procedure: CATARACT EXTRACTION PHACO AND INTRAOCULAR LENS PLACEMENT ([place])  RIGHT DIABETIC;  Surgeon: Birder Robson, MD;  Location: [place];  Service: Ophthalmology;  Laterality: Right;  Diabetic - oral meds  . COLONOSCOPY    . COLONOSCOPY WITH PROPOFOL N/A 05/16/2015   Procedure: COLONOSCOPY WITH PROPOFOL;  Surgeon: Manya Silvas, MD;  Location[facility] ENDOSCOPY;  Service: Endoscopy;  Laterality: N/A;  . FEMUR IM NAIL Left 09/25/2020   Procedure: INTRAMEDULLARY (IM) NAIL HIP SCREW;  Surgeon: Meredith Pel, MD;  Location: [place];  Service: Orthopedics;  Laterality: Left;  . ORIF FINGER / THUMB FRACTURE Left    thumb, 1970s   Social History   Socioeconomic History  . Marital status: Legally Separated    Spouse name: Not on file  . Number of children: Not on file  . Years of education: Not on file  . Highest  education level: Not on file  Occupational History  . Not on file  Tobacco Use  . Smoking status: Former Smoker    Quit date: 2000    Years since quitting: 22.0  . Smokeless tobacco: Never Used  Vaping Use  . Vaping Use: Never used  Substance and Sexual Activity  . Alcohol use: Yes    Alcohol/week: 14.0 standard drinks    Types: 14 Shots of liquor per week  . Drug use: No  . Sexual activity: Not on file  Other Topics Concern  . Not on file  Social History Narrative  . Not on file   Social Determinants of Health   Financial Resource Strain: Not on file  Food Insecurity: Not on file  Transportation Needs: Not on file  Physical Activity: Not on file  Stress: Not on file  Social Connections: Not on file   Family History  Problem Relation Age of Onset  . Lung cancer Father   . Prostate cancer Neg Hx   . Bladder Cancer Neg Hx   . Kidney cancer Neg Hx    - negative except otherwise stated in the family history section Allergies  Allergen Reactions  . Sulfa Antibiotics Rash   Prior to Admission medications   Medication Sig Start Date End Date Taking? Authorizing Provider  amLODipine (NORVASC) 10 MG tablet Take 10 mg by mouth daily.   Yes [provider]  Celedonio Miyamoto 62.5-25 MCG/INH  AEPB Inhale 1 puff into the lungs daily. 11/07/20  Yes [provider]  aspirin EC 81 MG tablet Take 81 mg by mouth daily.   Yes [provider]  atenolol (TENORMIN) 50 MG tablet Take 50 mg by mouth daily.   Yes [provider]  B Complex Vitamins (VITAMIN B COMPLEX PO) Take by mouth daily.   Yes [provider]  diltiazem (CARDIZEM CD) 300 MG 24 hr capsule Take 1 capsule (300 mg total) by mouth daily. 10/03/20 10/03/21 Yes Charlynne Cousins, MD  ELIQUIS 5 MG TABS tablet Take 5 mg by mouth 2 (two) times daily. 07/28/20  Yes [provider]  empagliflozin (JARDIANCE) 25 MG TABS tablet Take 25 mg by mouth daily.  02/23/20 02/22/21 Yes [provider]  fenofibrate 54 MG tablet Take 54 mg by mouth daily.   Yes [provider]  furosemide (LASIX) 20 MG tablet Take 20 mg by mouth daily.  02/03/18  Yes [provider]  gabapentin (NEURONTIN) 100 MG capsule Take 100 mg by mouth 3 (three) times daily.   Yes [provider]  guaiFENesin (MUCINEX) 600 MG 12 hr tablet Take 600 mg by mouth every 6 (six) hours as needed for cough.   Yes [provider]  losartan (COZAAR) 50 MG tablet Take 50 mg by mouth daily. Hold if BP is less than or equal to 100/60   Yes [provider]  metFORMIN (GLUCOPHAGE) 1000 MG tablet Take 1,000 mg by mouth 2 (two) times daily with a meal.   Yes [provider]  methocarbamol (ROBAXIN) 500 MG tablet Take 1 tablet (500 mg total) by mouth every 6 (six) hours as needed for muscle spasms. 10/03/20  Yes Charlynne Cousins, MD  oxyCODONE (OXY IR/ROXICODONE) 5 MG immediate release tablet Take 1-2 tablets (5-10 mg total) by mouth every 4 (four) hours as needed for moderate pain (pain score 4-6). 10/03/20  Yes Charlynne Cousins, MD  rosuvastatin (CRESTOR) 5 MG tablet Take 5 mg by mouth daily.   Yes [provider]  senna (SENOKOT) 8.6 MG TABS tablet Take 1 tablet by mouth in the morning and at bedtime.   Yes [provider]  VICTOZA 18 MG/3ML SOPN Inject 1.8 mg into the skin at bedtime.  12/01/16  Yes [provider]  albuterol (VENTOLIN HFA) 108 (90 Base) MCG/ACT inhaler Inhale into the lungs every 6 (six) hours as needed for wheezing or shortness of breath. Patient not taking: No sig reported    [provider]  Blood Glucose Monitoring Suppl (GLUCOCOM BLOOD GLUCOSE MONITOR) DEVI 1 each by XX route as directed. 07/29/15   [provider]  glucose blood (ONETOUCH ULTRA) test strip Use 3 (three) times daily E11.29 08/21/19   [provider]  Insulin Pen Needle (B-D UF III MINI PEN NEEDLES) 31G X 5 MM MISC Use once  daily E11.29 11/25/19   [provider]   CT HEAD WO CONTRAST  Result Date: 12/05/2020 CLINICAL DATA:  Worsening lethargy and diplopia. Recent hip fracture. EXAM: CT HEAD WITHOUT CONTRAST TECHNIQUE: Contiguous axial images were obtained from the base of the skull through the vertex without intravenous contrast. COMPARISON:  None. FINDINGS: Brain: Mild age related volume loss. No evidence of old or acute focal infarction, mass lesion, hemorrhage, hydrocephalus or extra-axial collection. Vascular: No abnormal vascular finding. Skull: Normal Sinuses/Orbits: Clear/normal.  Previous lens implant on the right. Other: None IMPRESSION: Normal head CT for age. Mild age related volume  loss. Electronically Signed   By: Nelson Chimes M.D.   On: 12/05/2020 14:12   CT PELVIS WO CONTRAST  Result Date: 12/05/2020 CLINICAL DATA:  Pelvic lymphadenopathy, B-cell lymphoma, scrotal swelling, left hip swelling EXAM: CT PELVIS WITHOUT CONTRAST TECHNIQUE: Multidetector CT imaging of the pelvis was performed following the standard protocol without intravenous contrast. COMPARISON:  CT abdomen pelvis 12/02/2020 FINDINGS: Urinary Tract: The bladder is decompressed with a Foley catheter balloon seen within its lumen. Bowel: Marked sigmoid diverticulosis. Mild free intraperitoneal fluid again noted, slightly progressive since prior examination. Vascular/Lymphatic: Moderate aortoiliac atherosclerotic calcification. No aortic aneurysm. No pathologic lymphadenopathy within the pelvis. Bilateral pathologic inguinal adenopathy is again seen and is stable. Reproductive: The prostate gland and seminal vesicles are unremarkable. Other: Perineural/perivascular soft tissue coursing along the expected location of the superior gluteal artery and nerve through the greater sciatic notch is again noted in keeping with perineural spread of disease. Musculoskeletal: Large lytic lesion eroding the left iliac spine is again seen and is unchanged  from prior examination measuring at least 6.8 x 11.4 cm at axial image # 34/9. Additional metastatic nodule identified within the left gluteal musculature. Large lytic lesion erodes the intratrochanteric region of the left femur, similar to that noted on prior examination, measuring at least 9.5 x 11.1 cm at axial image # 121/9. Multiple additional soft tissue nodules are seen within the rectus femoris and vastus lateralis musculature. 2.6 cm intramedullary lytic lesion within the a intratrochanteric region of the right femur is again noted with cortical erosion posteriorly comprising approximately 25% of the circumference of the femur. There is asymmetric edema and soft tissue swelling involving the proximal left thigh and hip. Mild right proximal thigh edema. Moderate bilateral flank subcutaneous edema, slightly progressive since prior examination. There is scrotal wall edema noted. No discrete fluid collection is identified within these regions, however. IMPRESSION: No discrete fluid collection identified within the soft tissues of the pelvis. Asymmetric edema of the proximal left thigh again identified. Multiple soft tissue and lytic osseous metastatic foci again identified, unchanged from prior examination. Electronically Signed   By: Fidela Salisbury MD   On: 12/05/2020 14:19   DG Chest Port 1 View  Result Date: 12/05/2020 CLINICAL DATA:  Acute respiratory distress EXAM: PORTABLE CHEST 1 VIEW COMPARISON:  Three days ago FINDINGS: Hazy right chest opacity with pleural fluid, atelectasis, and pulmonary opacity by CT. Normal heart size and stable hilar contours. Right PICC with tip at the SVC. IMPRESSION: Unchanged airspace disease and pleural effusion asymmetric to the right. Electronically Signed   By: Monte Fantasia M.D.   On: 12/05/2020 05:38   - pertinent xrays, CT, MRI studies were reviewed and independently interpreted  Positive ROS: All other systems have been reviewed and were otherwise negative  with the exception of those mentioned in the HPI and as above.  Physical Exam: General: Alert, no acute distress Psychiatric: Patient is competent for consent with normal mood and affect Lymphatic: No axillary or cervical lymphadenopathy Cardiovascular: No pedal edema Respiratory: No cyanosis, no use of accessory musculature GI: No organomegaly, abdomen is soft and non-tender    Images:  @ENCIMAGES @  Labs:  Lab Results  Component Value Date   HGBA1C 5.0 12/02/2020   HGBA1C 5.4 09/25/2020   LABURIC 4.5 12/05/2020   LABURIC 4.5 12/04/2020   LABURIC 13.5 (H) 12/03/2020   REPTSTATUS 10/02/2020 FINAL 09/27/2020   CULT  09/27/2020    NO GROWTH 5 DAYS Performed at [facility], [address] [place], [place] [address]     Lab Results  Component Value Date   ALBUMIN 2.4 (L) 12/05/2020   ALBUMIN 2.1 (L) 12/04/2020   ALBUMIN 3.0 (L) 12/03/2020   LABURIC 4.5 12/05/2020   LABURIC 4.5 12/04/2020   LABURIC 13.5 (H) 12/03/2020    Neurologic: Patient does not have protective sensation bilateral lower extremities.   MUSCULOSKELETAL:   Incisions well-healed with some mild surrounding erythema.  No active or expressible drainage.  No sinus tract noted.  One incision posteriorly with retained sutures.  Increased erythema in the anterior proximal thigh.  Diffuse tenderness throughout the area of proximal erythema.  No significant pain with logroll of the left femur.  Trace effusion of left knee with no pain with passive motion of left knee. No calf tenderness of the left calf.    Skin breakdown and superficial ulceration at the superior gluteal cleft bilaterally.    Assessment: Postoperative left hip fracture  Plan: Images taken of the left hip and glute.  Chart review and CT scan review performed with Dr. Marlou Sa.  With incisions well healed without drainage, no sinus tract noted, and no focal abscess noted on CT scan, no significant concern for postoperative infection. No  indication for surgical I&D at this time.    Thank you for the consult and the opportunity to see Mr. Taelor, [facility] [phone] 4:15 PM

## 2020-12-05 NOTE — Progress Notes (Addendum)
[place] for Heparin IV Indication: atrial fibrillation  Allergies  Allergen Reactions  . Sulfa Antibiotics Rash    Patient Measurements: Height: 6' (182.9 cm) Weight: 127.7 kg (281 lb 8.4 oz) IBW/kg (Calculated) : 77.6 Heparin Dosing Weight: 107.6 kg  Vital Signs: Temp: 102.38 F (39.1 C) (01/31 2100) BP: 106/63 (01/31 1200) Pulse Rate: 119 (01/31 1915)  Labs: Recent Labs    12/03/20 0700 12/04/20 0500 12/05/20 0409 12/05/20 1708 12/05/20 2200  HGB 11.5* 8.7* 11.2*  --   --   HCT 39.2 30.2* 38.4*  --   --   PLT 286 172 196  --   --   LABPROT  --   --  16.1*  --   --   INR  --   --  1.3*  --   --   HEPARINUNFRC  --   --   --   --  0.75*  CREATININE 1.32* 0.95 1.12 1.10  --     Estimated Creatinine Clearance: 93.7 mL/min (by C-G formula based on SCr of 1.1 mg/dL).   Medical History: Past Medical History:  Diagnosis Date  . Anemia   . Arthritis    knees  . COPD (chronic obstructive pulmonary disease) ([place])   . Diabetes mellitus without complication ([place])   . Hyperlipemia   . Hypertension   . Hypertriglyceridemia   . Hypogonadism in male   . Numbness of foot    bilateral  . Obesity   . Tubular adenoma of colon   . Vitamin D deficiency     Medications:  Scheduled:  . (feeding supplement) PROSource Plus  30 mL Oral TID BM  . allopurinol  300 mg Oral Daily  . B-complex with vitamin C   Oral Daily  . Chlorhexidine Gluconate Cloth  6 each Topical Daily  . Chlorhexidine Gluconate Cloth  6 each Topical Q0600  . feeding supplement (GLUCERNA SHAKE)  237 mL Oral TID BM  . fenofibrate  54 mg Oral Daily  . gabapentin  100 mg Oral TID  . insulin aspart  0-20 Units Subcutaneous TID WC  . insulin aspart  0-5 Units Subcutaneous QHS  . mouth rinse  15 mL Mouth Rinse BID  . methylPREDNISolone (SOLU-MEDROL) injection  80 mg Intravenous Daily  . pantoprazole  40 mg Intravenous QHS  . rosuvastatin  5 mg Oral Daily  . senna  1  tablet Oral QHS  . sodium chloride flush  10-40 mL Intracatheter Q12H  . umeclidinium-vilanterol  1 puff Inhalation Daily   Infusions:  . sodium chloride Stopped (12/05/20 0438)  . sodium chloride    . sodium chloride    . sodium chloride Stopped (12/05/20 1752)  . sodium chloride    . heparin    . norepinephrine (LEVOPHED) Adult infusion 11 mcg/min (12/05/20 2133)  . piperacillin-tazobactam (ZOSYN)  IV 12.5 mL/hr at 12/05/20 1800  .  sodium bicarbonate (isotonic) infusion in sterile water 50 mL/hr at 12/05/20 1900  . [START ON Jan 02, 2021] vancomycin      Assessment: 63 yoM admitted on 1/27 with lethargy, hypercalcemia.  He was on apixaban prior to admission, is s/p left femur nail and fixation, and has been on treatment dose Lovenox since 1/29 for Afib.  Lovenox doses were held on 1/31 for planned biopsy.  Last dose given on 1/30 at 10:36.  Pharmacy is now consulted to transition from Lovenox to Heparin IV for possible surgery and high risk for AKI.  SCr 1.12 CBC:  Hgb improved to 11.2, Plt 196.  Acute anemia likely due to lymphoma. No bleeding or complications reported. Per MD notes, has erythema and dark red lesion over left hip. Sutures remain in place in left thigh. No visible drainage.  CT with diffuse edema.  PM Update:  22:00 HL = 0.75 (supratherapeutic) with IV heparin @ 1500 units/hr (HL drawn approx 5 hr after heparin started)  No bleeding or complications of therapy reported by RN  Goal of Therapy:  Heparin level 0.3-0.7 units/ml Monitor platelets by anticoagulation protocol: Yes   Plan:  Decrease heparin IV infusion to 1400 units/hr Heparin level 6 hours after reducing rate Daily heparin level and CBC Continue to monitor H&H and platelets   Leone Haven, PharmD 12/05/2020 10:57 PM   ADDENDUM:  Heparin level = 0.68 with heparin gtt infusing @ 1400 units/hr Hgb 11.5 (stable), PLTC 205 (stable), WBC 5.0 => 0.5 No bleeding or complications of therapy  noted  Plan: Continue IV heparin @ 1400 units/hr Recheck heparin level in 6 hr to confirm therapeutic dose  Leone Haven, PharmD 2020-12-13 @ 05:37

## 2020-12-05 NOTE — Progress Notes (Signed)
RN attempted to removed sutures in left hip per ortho PA request. 2 sutures were successfully removed. The remaining sutures were not successful as the ties are embedded in the healed tissue at the incision site. Ortho PA notified.

## 2020-12-05 NOTE — Progress Notes (Signed)
ANTICOAGULATION CONSULT NOTE - Initial Consult  Pharmacy Consult for Heparin IV Indication: atrial fibrillation  Allergies  Allergen Reactions  . Sulfa Antibiotics Rash    Patient Measurements: Height: 6' (182.9 cm) Weight: 127.7 kg (281 lb 8.4 oz) IBW/kg (Calculated) : 77.6 Heparin Dosing Weight: 107.6 kg  Vital Signs: Temp: 100.4 F (38 C) (01/31 1000) Temp Source: Core (Comment) (01/31 0800) BP: 106/63 (01/31 1200) Pulse Rate: 113 (01/31 1000)  Labs: Recent Labs    12/03/20 0700 12/04/20 0500 12/05/20 0409  HGB 11.5* 8.7* 11.2*  HCT 39.2 30.2* 38.4*  PLT 286 172 196  LABPROT  --   --  16.1*  INR  --   --  1.3*  CREATININE 1.32* 0.95 1.12    Estimated Creatinine Clearance: 92 mL/min (by C-G formula based on SCr of 1.12 mg/dL).   Medical History: Past Medical History:  Diagnosis Date  . Anemia   . Arthritis    knees  . COPD (chronic obstructive pulmonary disease) ([place])   . Diabetes mellitus without complication ([place])   . Hyperlipemia   . Hypertension   . Hypertriglyceridemia   . Hypogonadism in male   . Numbness of foot    bilateral  . Obesity   . Tubular adenoma of colon   . Vitamin D deficiency     Medications:  Scheduled:  . (feeding supplement) PROSource Plus  30 mL Oral TID BM  . allopurinol  300 mg Oral Daily  . B-complex with vitamin C   Oral Daily  . Chlorhexidine Gluconate Cloth  6 each Topical Daily  . Chlorhexidine Gluconate Cloth  6 each Topical Q0600  . feeding supplement (GLUCERNA SHAKE)  237 mL Oral TID BM  . fenofibrate  54 mg Oral Daily  . gabapentin  100 mg Oral TID  . insulin aspart  0-20 Units Subcutaneous TID WC  . insulin aspart  0-5 Units Subcutaneous QHS  . mouth rinse  15 mL Mouth Rinse BID  . methylPREDNISolone (SOLU-MEDROL) injection  80 mg Intravenous Daily  . pantoprazole  40 mg Intravenous QHS  . rosuvastatin  5 mg Oral Daily  . senna  1 tablet Oral QHS  . sodium chloride flush  10-40 mL Intracatheter Q12H  .  umeclidinium-vilanterol  1 puff Inhalation Daily   Infusions:  . sodium chloride Stopped (12/05/20 0438)  . sodium chloride    . sodium chloride    . sodium chloride    . sodium chloride    . lactated ringers 125 mL/hr at 12/05/20 1400  . magnesium sulfate bolus IVPB 50 mL/hr at 12/05/20 1400  . norepinephrine (LEVOPHED) Adult infusion 2 mcg/min (12/05/20 1451)  . piperacillin-tazobactam (ZOSYN)  IV 12.5 mL/hr at 12/05/20 1400  . vancomycin 250 mL/hr at 12/05/20 1400  . [START ON 2021/01/01] vancomycin      Assessment: 15 yoM admitted on 1/27 with lethargy, hypercalcemia.  He was on apixaban prior to admission, is s/p left femur nail and fixation, and has been on treatment dose Lovenox since 1/29 for Afib.  Lovenox doses were held on 1/31 for planned biopsy.  Last dose given on 1/30 at 10:36.  Pharmacy is now consulted to transition from Lovenox to Heparin IV for possible surgery and high risk for AKI.  SCr 1.12 CBC: Hgb improved to 11.2, Plt 196.  Acute anemia likely due to lymphoma. No bleeding or complications reported. Per MD notes, has erythema and dark red lesion over left hip. Sutures remain in place in left thigh. No  visible drainage.  CT with diffuse edema.   Goal of Therapy:  Heparin level 0.3-0.7 units/ml Monitor platelets by anticoagulation protocol: Yes   Plan:  Give heparin 5000 units bolus IV x 1 Start heparin IV infusion at 1500 units/hr Heparin level 6 hours after starting Daily heparin level and CBC Continue to monitor H&H and platelets   Gretta Arab PharmD, BCPS Clinical Pharmacist [facility] main pharmacy [phone] 12/05/2020 3:10 PM

## 2020-12-05 NOTE — Progress Notes (Signed)
Pts HR sustained in 120-130s range with a. Fib rhythm on monitor. Notified on call provider and received order for metoprolol prn sustained HR >120. Dose given and HR in 110s now, will continue to monitor. Hortencia Conradi RN

## 2020-12-05 NOTE — Progress Notes (Addendum)
   12/05/20 0356  Assess: MEWS Score  BP 113/63  Pulse Rate (!) 135  Resp (!) 24  SpO2 95 %  O2 Device Nasal Cannula  O2 Flow Rate (L/min) 2 L/min  Assess: MEWS Score  MEWS Temp 0  MEWS Systolic 0  MEWS Pulse 3  MEWS RR 1  MEWS LOC 0  MEWS Score 4  MEWS Score Color Red  Assess: if the MEWS score is Yellow or Red  Were vital signs taken at a resting state? Yes  Focused Assessment Change from prior assessment (see assessment flowsheet)  Early Detection of Sepsis Score *See Row Information* Medium  MEWS guidelines implemented *See Row Information* Yes  Treat  MEWS Interventions Administered scheduled meds/treatments;Other (Comment) (been in contact with NP; called RR)  Take Vital Signs  Increase Vital Sign Frequency  Red: Q 1hr X 4 then Q 4hr X 4, if remains red, continue Q 4hrs  Escalate  MEWS: Escalate Red: discuss with charge nurse/RN and provider, consider discussing with RRT  Notify: Charge Nurse/RN  Name of Charge Nurse/RN Notified Vera RN  Date Charge Nurse/RN Notified 12/05/20  Time Charge Nurse/RN Notified 0405  Notify: Provider  Provider Name/Title Blount NP  Date Provider Notified 11/28/20  Time Provider Notified [phone]  Notification Type Page  Notification Reason Change in status  Notify: Rapid Response  Name of Rapid Response RN Notified Mandy RN  Date Rapid Response Notified 12/05/20  Time Rapid Response Notified 0401   Pts HR remained sustained in 120s-130s after multiple interventions/medications. MEWS score turned red due to HR and RR. Rapid response and NP paged per protocol, RR RN came to room to assess pt. EKG showing a. Fib with RVR, prompting need to transfer pt to stepdown. Moved to room 1229; report given at bedside to [facility]. Called pts wife Leafy Ro per his request to notify her of change in pts status. Hortencia Conradi RN

## 2020-12-05 NOTE — Progress Notes (Signed)
PT Cancellation Note  Patient Details Name: Andrew Dominguez MRN: [id number] DOB: Sep 30, 1956   Cancelled Treatment:    Reason Eval/Treat Not Completed: Medical issues which prohibited therapy. Pt transferred to ICU early am after rapid response called d/t elevated HR, afib/RVR. Defer PT today, continue efforts. Also noted GOC pending.    [facility] 12/05/2020, 1:04 PM

## 2020-12-05 NOTE — Significant Event (Signed)
Rapid Response Event Note   Reason for Call :  Received call from [facility]: Lambert Rhonda W, floor RN, that patient has experienced elevated HR in 120-130 that is not responding to prescribed meds ordered by on call provider.   Initial Focused Assessment:  Patient alert and oriented, initially denying pain, but appears short of breath and objective signs of pain seen (grimacing, guarding). Patient has not recently had pain medication. Observed warmth and redness over left hip s/p hip surgery that is concerning for infection.    Interventions:  EKG, CBG, lactic acid, CXR, paged on call provider.   Plan of Care:  EKG showed A-fib RVR that is not responding to multiple doses of metoprolol and cardizem. Moving to stepdown room 1229 for closer monitoring.    Event Summary:  Patient transferred to 1229, bedside report given by Larene Beach to [place]. Blount, NP came to bedside.  MD Notified: Kennon Holter, NP Call Time: [phone] Arrival Time: [place] End Time: [place], RN

## 2020-12-05 NOTE — Progress Notes (Addendum)
Andrew Dominguez   DOB:1956-06-16   JI#:[id number]   FBP#:[id number]  Subjective:   He is lethargic, waking up to name but doesn't respond, this is a big change in [place] for him. He was eating and was able to converse with me yesterday, he said he felt a little tired yesterday He is now in step down, on broad spectrum abx, having recurrent fevers,  Objective:  Vitals:   12/05/20 1000 12/05/20 1200  BP: (!) 89/64 106/63  Pulse: (!) 113   Resp: (!) 25   Temp: (!) 100.4 F (38 C)   SpO2: 97%     Body mass index is 38.18 kg/m.  Intake/Output Summary (Last 24 hours) at 12/05/2020 1355 Last data filed at 12/05/2020 1200 Gross per 24 hour  Intake 5028.01 ml  Output 90 ml  Net 4938.01 ml    Lethargic, not able to converse. Ant lung fields, shallow breathing but no adv sounds. Abdomen: silent. BLE 2 + edema, symmetrical Some small area of cellulitis on left thigh.  CBG (last 3)  Recent Labs    12/05/20 0441 12/05/20 0756 12/05/20 1154  GLUCAP 119* 122* 96    Labs:  Lab Results  Component Value Date   WBC 5.0 12/05/2020   HGB 11.2 (L) 12/05/2020   HCT 38.4 (L) 12/05/2020   MCV 81.2 12/05/2020   PLT 196 12/05/2020   NEUTROABS 4.5 12/05/2020    Urine Studies No results for input(s): UHGB, CRYS in the last 72 hours.  Invalid input(s): UACOL, UAPR, USPG, UPH, UTP, UGL, UKET, UBIL, UNIT, UROB, [place], UEPI, UWBC, [place], [place], [place], [place], [place]  Basic Metabolic Panel: Recent Labs  Lab 12/03/2020 1801 12/02/20 0220 12/03/20 0700 12/04/20 0500 12/05/20 0409  NA 139 140 145 144 141  K 4.5 4.3 4.0 3.0* 4.4  CL 104 105 111 116* 112*  CO2 22 23 22  21* 20*  GLUCOSE 93 90 123* 117* 141*  BUN 59* 55* 48* 41* 45*  CREATININE 1.91* 1.69* 1.32* 0.95 1.12  CALCIUM 14.7* 14.1* 12.3* 8.8* 9.1  MG  --   --  1.7 1.3* 1.6*  PHOS 3.7  --  3.2 2.6 2.9   GFR Estimated Creatinine Clearance: 92 mL/min (by C-G formula based on SCr of 1.12 mg/dL). Liver Function Tests: Recent Labs  Lab  11/08/2020 1801 12/02/20 0220 12/03/20 0700 12/04/20 0500 12/05/20 0409  AST 17 16 16  12* 21  ALT 14 14 15 10 16   ALKPHOS 72 70 59 42 50  BILITOT 0.9 0.9 0.8 0.4 0.4  PROT 5.7* 5.6* 5.5* 4.1* 4.9*  ALBUMIN 2.9* 2.9* 3.0* 2.1* 2.4*   No results for input(s): LIPASE, AMYLASE in the last 168 hours. No results for input(s): AMMONIA in the last 168 hours. Coagulation profile Recent Labs  Lab 12/05/20 0409  INR 1.3*    CBC: Recent Labs  Lab 11/08/2020 1801 12/02/20 0220 12/03/20 0700 12/04/20 0500 12/05/20 0409  WBC 7.2 5.8 5.9 4.3 5.0  NEUTROABS  --   --  4.6 3.5 4.5  HGB 12.3* 11.9* 11.5* 8.7* 11.2*  HCT 40.7 40.2 39.2 30.2* 38.4*  MCV 79.5* 80.1 81.5 84.1 81.2  PLT 325 257 286 172 196   Cardiac Enzymes: No results for input(s): CKTOTAL, CKMB, CKMBINDEX, TROPONINI in the last 168 hours. BNP: Invalid input(s): POCBNP CBG: Recent Labs  Lab 12/04/20 1658 12/04/20 2206 12/05/20 0441 12/05/20 0756 12/05/20 1154  GLUCAP 140* 152* 119* 122* 96   D-Dimer No results for input(s): DDIMER in the last  72 hours. Hgb A1c No results for input(s): HGBA1C in the last 72 hours. Lipid Profile No results for input(s): CHOL, HDL, LDLCALC, TRIG, CHOLHDL, LDLDIRECT in the last 72 hours. Thyroid function studies No results for input(s): TSH, T4TOTAL, T3FREE, THYROIDAB in the last 72 hours.  Invalid input(s): FREET3 Anemia work up No results for input(s): VITAMINB12, FOLATE, FERRITIN, TIBC, IRON, RETICCTPCT in the last 72 hours. Microbiology Recent Results (from the past 240 hour(s))  SARS CORONAVIRUS 2 (TAT 6-24 HRS) Nasopharyngeal Nasopharyngeal Swab     Status: None   Collection Time: 11/05/2020  6:01 PM   Specimen: Nasopharyngeal Swab  Result Value Ref Range Status   SARS Coronavirus 2 NEGATIVE NEGATIVE Final    Comment: (NOTE) SARS-CoV-2 target nucleic acids are NOT DETECTED.  The SARS-CoV-2 RNA is generally detectable in upper and lower respiratory specimens during the  acute phase of infection. Negative results do not preclude SARS-CoV-2 infection, do not rule out co-infections with other pathogens, and should not be used as the sole basis for treatment or other patient management decisions. Negative results must be combined with clinical observations, patient history, and epidemiological information. The expected result is Negative.  Fact Sheet for Patients: SugarRoll.be  Fact Sheet for Healthcare Providers: https://www.woods-mathews.com/  This test is not yet approved or cleared by the [place] FDA and  has been authorized for detection and/or diagnosis of SARS-CoV-2 by FDA under an Emergency Use Authorization (EUA). This EUA will remain  in effect (meaning this test can be used) for the duration of the COVID-19 declaration under Se ction 564(b)(1) of the Act, 21 U.S.C. section 360bbb-3(b)(1), unless the authorization is terminated or revoked sooner.  Performed at [facility], [place] [address], [place], [place] [address]   SARS Coronavirus 2 by RT PCR (hospital order, performed in [facility] hospital lab) Nasopharyngeal Nasopharyngeal Swab     Status: None   Collection Time: 12/02/20  1:49 AM   Specimen: Nasopharyngeal Swab  Result Value Ref Range Status   SARS Coronavirus 2 NEGATIVE NEGATIVE Final    Comment: (NOTE) SARS-CoV-2 target nucleic acids are NOT DETECTED.  The SARS-CoV-2 RNA is generally detectable in upper and lower respiratory specimens during the acute phase of infection. The lowest concentration of SARS-CoV-2 viral copies this assay can detect is 250 copies / mL. A negative result does not preclude SARS-CoV-2 infection and should not be used as the sole basis for treatment or other patient management decisions.  A negative result may occur with improper specimen collection / handling, submission of specimen other than nasopharyngeal swab, presence of viral mutation(s) within  the areas targeted by this assay, and inadequate number of viral copies (<250 copies / mL). A negative result must be combined with clinical observations, patient history, and epidemiological information.  Fact Sheet for Patients:   StrictlyIdeas.no  Fact Sheet for Healthcare Providers: BankingDealers.co.za  This test is not yet approved or  cleared by the [place] FDA and has been authorized for detection and/or diagnosis of SARS-CoV-2 by FDA under an Emergency Use Authorization (EUA).  This EUA will remain in effect (meaning this test can be used) for the duration of the COVID-19 declaration under Section 564(b)(1) of the Act, 21 U.S.C. section 360bbb-3(b)(1), unless the authorization is terminated or revoked sooner.  Performed at [facility], [place] [address] [place], [place] [address]   MRSA PCR Screening     Status: None   Collection Time: 12/05/20  5:15 AM   Specimen:  Nasopharyngeal  Result Value Ref Range Status   MRSA by PCR NEGATIVE NEGATIVE Final    Comment:        The GeneXpert MRSA Assay (FDA approved for NASAL specimens only), is one component of a comprehensive MRSA colonization surveillance program. It is not intended to diagnose MRSA infection nor to guide or monitor treatment for MRSA infections. Performed at [facility], [place] [address], [place], [place] [address]      Studies:  DG Chest Port 1 View  Result Date: 12/05/2020 CLINICAL DATA:  Acute respiratory distress EXAM: PORTABLE CHEST 1 VIEW COMPARISON:  Three days ago FINDINGS: Hazy right chest opacity with pleural fluid, atelectasis, and pulmonary opacity by CT. Normal heart size and stable hilar contours. Right PICC with tip at the SVC. IMPRESSION: Unchanged airspace disease and pleural effusion asymmetric to the right. Electronically Signed   By: Monte Fantasia M.D.   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 We have consulted with his primary oncologist who agreed that it is important to start chemotherapy as soon as possible, and also requested another biopsy to run some additional testing. Started with R CHOP which is a universally acceptable treatment in large B cell lymphomas despite presence of absence of double/triple hit lymphomas. C1D1 R_ CHOP 12/03/2020, rituximab today. ECHO, EF satisfactory to proceed I discussed adverse effects of chemotherapy with patient and MPOA. Since patient was confused MPOA gave [place] permission to proceed. Verbal consent given over phone and my NP Mikey Bussing was present at the time of discussion. Discussed about common side effects such as fatigue, nausea, vomiting,diarrhea, increased risk of infections, cardiotoxicity, neuropathy, and alopecia. He says, he has to try his best. Discussed about fatal side effects from chemo with wife too which is very rare. TLS improving. Chemo is Q 21 days, so he doesn't need another cycle for 3 weeks.  2. Hypotension, fevers, possible sepsis. Agree with broad spectrum abx per primary team. Will hold off neupogen at this time, this is technically a prophylaxis used along with R CHOP,  He is a patient of Dr Eual Fines and will FU with her upon discharge.  Benay Pike, MD 12/05/2020  1:55 PM

## 2020-12-05 NOTE — Progress Notes (Signed)
At beginning of shift, pt had small area of redness to left groin area. Given his urinary incontinence, it looked like possible MASD so we applied barrier cream. Around 0300, I noticed that the redness had spread all over the upper thigh and hip and the area was taut with edema. Reported this finding off to stepdown RN and rapid response RN when pt transferred. Hortencia Conradi RN

## 2020-12-05 NOTE — Plan of Care (Signed)
Hyperkalemia, mild worsening of acidosis on follow up labs.  Place NGT, lokelma LR stopped, start bicarb gtt at 50cc/hr Follow up AM BMP; sooner if arrhythmias, etc.  Julian Hy, DO 12/05/20 6:43 PM [facility]

## 2020-12-05 NOTE — Progress Notes (Signed)
Pharmacy Antibiotic Note  Andrew Dominguez is a 65 y.o. male admitted on 11/29/2020 with lethargy.  Pharmacy has been consulted for vancomycin & zosyn dosing for sepsis.  S/p CHOP for large B cell lymphoma 1/29 with rituxin 1/30 Tm 102.9, WBC 5, ANC 4.5 (on Granix) , SCr 1.12 Plan: Zosyn 3.375g IV q8h (4 hour infusion).  Vancomycin 2500 mg IV loading dose  Vancomycin 1000 mg IV q12 for AUC 481, using SCr 1.12, Vd 0.5 Daily SCr while on both vancomycin & zosyn F/u WBC, temp, culture data vanc levels as needed  Height: 6' (182.9 cm) Weight: 127.7 kg (281 lb 8.4 oz) IBW/kg (Calculated) : 77.6  Temp (24hrs), Avg:100 F (37.8 C), Min:97.6 F (36.4 C), Max:102.92 F (39.4 C)  Recent Labs  Lab 11/21/2020 1801 12/02/20 0220 12/03/20 0700 12/04/20 0500 12/05/20 0409 12/05/20 0442 12/05/20 0747  WBC 7.2 5.8 5.9 4.3 5.0  --   --   CREATININE 1.91* 1.69* 1.32* 0.95 1.12  --   --   LATICACIDVEN  --   --   --   --   --  1.8 1.9    Estimated Creatinine Clearance: 92 mL/min (by C-G formula based on SCr of 1.12 mg/dL).    Allergies  Allergen Reactions  . Sulfa Antibiotics Rash    Antimicrobials this admission:  1/31 vanc> 1/31 zosyn>> Dose adjustments this admission:   Microbiology results:  1/28 Hep A/B/C NR 1/31 MRSA neg 1/27 & 1/27 Covid neg 1/31 BCx2: ordered  Thank you for allowing pharmacy to be a part of this patient's care.  Eudelia Bunch, Pharm.D 12/05/2020 9:23 AM

## 2020-12-05 NOTE — TOC Initial Note (Signed)
Transition of Care [facility]) - Initial/Assessment Note    Patient Details  Name: Andrew Dominguez MRN: [id number] Date of Birth: [birth year]  Transition of Care [facility]) CM/SW Contact:    Leeroy Cha, RN Phone Number: 12/05/2020, 8:05 AM  Clinical Narrative:                  65 y.o. 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   He was brought to the hospital on 12/02/2020 with altered mental status.  In the ED labs showed BUN of 59 creatinine 1.91. Calcium is 14.7. Corrected calcium is more than 15 and he was admitted for treatment.  September 24, 2020 he was admitted for hip fracutre.  He underwent placement of an intramedullary nail hip screw by Dr. Marlou Sa with orthopedics.   Biopsy was done at that time of a bone lesion. Pathology revealed large B-cell lymphoma.   He has been followed by oncology at [facility].   Additional work-up was planned to initiate treatment but the patient could not get to their facility as he was residing in a SNF in [place] for rehab after the hip fracture.  PLAN: to return back to [place] to finish rehab fo0r hip nailing.  Expected Discharge Plan: Skilled Nursing Facility Barriers to Discharge: Continued Medical Work up   Patient Goals and CMS Choice Patient states their goals for this hospitalization and ongoing recovery are:: refuses to state      Expected Discharge Plan and Services Expected Discharge Plan: [place]   Discharge Planning Services: CM Consult Post Acute Care Choice: [place] Living arrangements for the past 2 months: [place]                                      Prior Living Arrangements/Services Living arrangements for the  past 2 months: [place] Lives with:: Facility Resident Patient language and need for interpreter reviewed:: Yes        Need for Family Participation in Patient Care: No (Comment) (skilled nursing home) Care giver support system in place?: No (comment) (skilled nursing home)   Criminal Activity/Legal Involvement Pertinent to Current Situation/Hospitalization: No - Comment as needed  Activities of Daily Living Home Assistive Devices/Equipment: Environmental consultant (specify type) (per computer notes, pt unable to verbalize any other equipment that he uses) ADL Screening (condition at time of admission) Patient's cognitive ability adequate to safely complete daily activities?: No Is the patient deaf or have difficulty hearing?: No Does the patient have difficulty seeing, even when wearing glasses/contacts?: No Does the patient have difficulty concentrating, remembering, or making decisions?: Yes Patient able to express need for assistance with ADLs?: No Does the patient have difficulty dressing or bathing?: Yes Independently performs ADLs?: No Communication: Independent Dressing (OT): Needs assistance Is this a change from baseline?: Pre-admission baseline Grooming: Needs assistance Is this a change from baseline?: Pre-admission baseline Feeding: Needs assistance Is this a change from baseline?: Pre-admission baseline Bathing: Needs assistance Is this a change from baseline?: Pre-admission baseline Toileting: Needs assistance Is this a change from baseline?: Pre-admission baseline In/Out Bed: Needs assistance Is this a change from baseline?: Pre-admission baseline Walks in Home: Needs assistance Is this a change from baseline?: Pre-admission  baseline Does the patient have difficulty walking or climbing stairs?: Yes Weakness of Legs: Both Weakness of Arms/Hands: Both  Permission Sought/Granted                  Emotional Assessment Appearance:: Appears stated  age Attitude/Demeanor/Rapport: Uncooperative Affect (typically observed): Unable to Assess Orientation: : Oriented to Self,Oriented to Place,Oriented to  Time,Oriented to Situation Alcohol / Substance Use: Not Applicable Psych Involvement: No (comment)  Admission diagnosis:  Hypercalcemia [E83.52] Dehydration [E86.0] Hypercalcemia of malignancy [E83.52] Neoplastic malignant related fatigue [R53.0] Generalized weakness [R53.1] AKI (acute kidney injury) ([place]) [N17.9] Patient Active Problem List   Diagnosis Date Noted  . Diffuse large B cell lymphoma ([place]) 12/02/2020  . Hypercalcemia 11/28/2020  . Atrial fibrillation with RVR ([place]) 09/27/2020  . Fever 09/27/2020  . Hip fracture ([place]) 09/24/2020  . S/p left hip fracture 09/24/2020  . Liver metastases ([place]) 09/12/2020  . Lung nodules 09/12/2020  . Lytic bone lesions on xray 08/23/2020  . Type 2 diabetes mellitus ([place]) 01/01/2017  . Obesity 01/01/2017  . Hypertriglyceridemia 01/01/2017  . Hypertension 01/01/2017  . Hyperlipidemia, unspecified 01/01/2017  . Hyperlipidemia 01/01/2017  . Diabetes mellitus ([place]) 01/01/2017  . Vitamin D deficiency 03/01/2015  . Bilateral leg edema 08/26/2014   PCP:  Baxter Hire, MD Pharmacy:   [id number] - [place], [place] [address] Phone: [phone] Fax: [phone]     Social Determinants of Health (SDOH) Interventions    Readmission Risk Interventions No flowsheet data found.

## 2020-12-05 NOTE — Consult Note (Addendum)
NAME:  Lin Glazier, MRN:  [id number], DOB:  1956-11-05, LOS: 4 ADMISSION DATE:  11/07/2020, CONSULTATION DATE:  1/31 REFERRING MD:  Pietro Cassis, CHIEF COMPLAINT:  Hypotension    Brief History:  65 year old male w/ B-cell Lymphoma (yet to undergo therapy) admitted w/ hypercalcemia and encephalopathy 1/27.  -received CHOP 1/29 -received Rituximab 1/30 - fever, AF w/ RVR and hypotension 1/31 w/ worsening MS (had been improving). PCCM consulted.   History of Present Illness:  65 year old male w/ hx as below. Residing as SNF since Nov 2021 after pathologic hip fracture w/ plan to eventually undergo therapy for metastatic large B cell Lymphoma.  Presented to the ED at [facility] 1/27 w/ cc: confusion/AMS worsening weakness and LE edema.  -In ER Ca >15 Treated w/ IV fluids and diuretics and calcitonin. Got Zometa on 1/28. Onc consulted.  IR was consulted to get confirmatory bx of liver lesion.  -MS better on 1/29 -given first round of CHOP 1/29 -Rituximab given 1/30 1/31 new tachycardia w/ HR 120-130s->moved to SDU, new redness left high and hip. Fever 102.9. Vanc and zosyn started.  PCCM asked to see for hypotension.   Past Medical History:   HTN, afib (on Eliquis), HLD, COPD w/ pulm nodules. DM type II w/ neuropathy,  liver mass, left kidney mass, splenic mass. Had hip fracture back Nov 2021 (was pathologic fx w/ Bx lesion + for B  cell Lymphoma).  Was being followed by ONC at [facility] but was still residing at [facility] and never made it to follow up.   Significant Hospital Events:   1/27 w/ cc: confusion/AMS worsening weakness and LE edema.  -In ER Ca >15 Treated w/ IV fluids and diuretics and calcitonin. Got Zometa on 1/28. Onc consulted.  IR was consulted to get confirmatory bx of liver lesion.  -MS better on 1/29 -given first round of CHOP 1/29 -Rituximab given 1/30 1/31 new tachycardia w/ HR 120-130s->moved to SDU, new redness left high and hip. Fever 102.9. Vanc and zosyn started.  PCCM asked  to see for hypotension.    Consults:  ONC 1/28 IR for liver bx 1/29  Procedures:    Significant Diagnostic Tests:  CT of chest/abd/pelvis 1/28: 1. Overall significant progression of malignancy. There is enlargement of the widespread hepatic masses; new and enlarging sub solid pulmonary nodules; new and enlarging thoracic and abdominal adenopathy; enlarging lytic destructive lesions of the proximal femurs and left iliac bone; new mass in the left gluteus maximus muscle; and similar large mass in the spleen but with some new soft tissue nodules along the medial spleen. 2. Large right and small left pleural effusions with passive atelectasis.3. Other imaging findings of potential clinical significance: Coronary, aortic arch, and branch vessel atherosclerotic vascular disease. Cholelithiasis. Mild perihepatic and pelvic ascites. Subcutaneous edema along the flanks. Small left adrenal myelolipoma. Small amount of pelvic ascites. 4. Aortic atherosclerosis.  Micro Data:  BCX2 1/31>>> MRSA PCR 1/31>>> Antimicrobials:  vanc 1/31 Zosyn 1/31  Interim History / Subjective:  Feels better but does have sig pain to left hip   Objective   Blood pressure (Abnormal) 74/43, pulse (Abnormal) 124, temperature (Abnormal) 101.84 F (38.8 C), resp. rate (Abnormal) 24, height 6' (1.829 m), weight 127.7 kg, SpO2 92 %.        Intake/Output Summary (Last 24 hours) at 12/05/2020 1016 Last data filed at 12/04/2020 1037 Gross per 24 hour  Intake 237 ml  Output no documentation  Net 237 ml   Danley Danker  Weights   11/17/2020 2225 12/02/20 0421 12/05/20 0500  Weight: 132.5 kg 132.5 kg 127.7 kg    Examination: General: chronically ill appearing 65 year old white male  HENT: MM dry and cracked. Neck veins flat. Eye crusted shut  Lungs: clear, decreased right bases no accessory use  Cardiovascular: regular irreg  Abdomen: soft not tender Extremities: pitting edema involving the left hip. It is warm to palp  extends down the hip and is painful to touch Neuro: awake and oriented  GU: voids   Resolved Hospital Problem list   AKI   Assessment & Plan:  Circulatory shock. Meets SIRS criteria so sepsis a possibility. Not sure about source. ? Left hip cellulitis (has significant lytic involvement of the left hip/prox femur and left gluteal muscle)  . Alternatively consider simply volume depletion or relative adrenal insuff. (could be a mix of any or all) Plan Cont IV hydration F/u cortisol Transduce CVP, gaol 8-12 No more lasix for now Dc CCB gtt Repeat LA Follow cultures and cont abx (day 1 vanc/zosyn) Will cont to watch left hip (may need to consider re-imaging) Need to discuss goals of care w/ family. Going to ask palliative to see.   afib w/ RVR Plan Cont IVFs Tele  Holding DOAC due to pending liver bx  Acute Metabolic Encephalopathy->improved Plan Cont supportive care  Metastatic B Large B cell Lymphoma w/ hyperuricemia and hypercalcemia -widely metastatic involving:   enlarging sub solid pulmonary nodules; new and enlarging thoracic and abdominal adenopathy; enlarging lytic destructive lesions of the proximal femurs and left iliac bone; new mass in the left gluteus maximus muscle; and similar large mass in the spleen but with some new soft tissue nodules along the medial spleen. -Ca normalized -Uric acid normalized  Now s/p initial CHOP 1/29 and Rituximab 1/30 Plan Cont supportive care Trending Uric acid, LDH and chems Systemic steroids per ONC  H/o COPD Plan Cont BDs  Right pleural effusion Plan Can consider diagnostic thora when more stable, would hold the therapeutic LMWH for at least 1 dose  Double vision.  Has been subacute for several weeks Plan Need CT head  Resolving AKI Plan Am chem Renal dose meds  Fluid and electrolyte imbalance: hypomagnesemia, Hyperchloremia and NAGMA  Plan Change IVF to LR Replace Mg Serial chemistries  Anemia of chronic  disease.  Plan Trend cbc  DM type 2 w/ neuropathy Plan ssi    Best practice (evaluated daily)  Diet: NPO w/ sips of clears Pain/Anxiety/Delirium protocol (if indicated): NA VAP protocol (if indicated): NA DVT prophylaxis: LMWH GI prophylaxis: NA Glucose control: SSI Mobility: BR Disposition: SDU  Goals of Care:  Last date of multidisciplinary goals of care discussion: pending Family and staff present: NA Summary of discussion: NA Follow up goals of care discussion due: asking palliative to see  Code Status: full code currently   Labs   CBC: Recent Labs  Lab 11/21/2020 1801 12/02/20 0220 12/03/20 0700 12/04/20 0500 12/05/20 0409  WBC 7.2 5.8 5.9 4.3 5.0  NEUTROABS  --   --  4.6 3.5 4.5  HGB 12.3* 11.9* 11.5* 8.7* 11.2*  HCT 40.7 40.2 39.2 30.2* 38.4*  MCV 79.5* 80.1 81.5 84.1 81.2  PLT 325 257 286 172 725    Basic Metabolic Panel: Recent Labs  Lab 11/08/2020 1801 12/02/20 0220 12/03/20 0700 12/04/20 0500 12/05/20 0409  NA 139 140 145 144 141  K 4.5 4.3 4.0 3.0* 4.4  CL 104 105 111 116* 112*  CO2 22  23 22 21* 20*  GLUCOSE 93 90 123* 117* 141*  BUN 59* 55* 48* 41* 45*  CREATININE 1.91* 1.69* 1.32* 0.95 1.12  CALCIUM 14.7* 14.1* 12.3* 8.8* 9.1  MG  --   --  1.7 1.3* 1.6*  PHOS 3.7  --  3.2 2.6 2.9   GFR: Estimated Creatinine Clearance: 92 mL/min (by C-G formula based on SCr of 1.12 mg/dL). Recent Labs  Lab 12/02/20 0220 12/03/20 0700 12/04/20 0500 12/05/20 0409 12/05/20 0442 12/05/20 0747  WBC 5.8 5.9 4.3 5.0  --   --   LATICACIDVEN  --   --   --   --  1.8 1.9    Liver Function Tests: Recent Labs  Lab 11/20/2020 1801 12/02/20 0220 12/03/20 0700 12/04/20 0500 12/05/20 0409  AST 17 16 16  12* 21  ALT 14 14 15 10 16   ALKPHOS 72 70 59 42 50  BILITOT 0.9 0.9 0.8 0.4 0.4  PROT 5.7* 5.6* 5.5* 4.1* 4.9*  ALBUMIN 2.9* 2.9* 3.0* 2.1* 2.4*   No results for input(s): LIPASE, AMYLASE in the last 168 hours. No results for input(s): AMMONIA in the  last 168 hours.  ABG    Component Value Date/Time   PHART 7.376 12/02/2020 1040   PCO2ART 39.7 12/02/2020 1040   PO2ART 73.0 (L) 12/02/2020 1040   HCO3 22.7 12/02/2020 1040   ACIDBASEDEF 1.7 12/02/2020 1040   O2SAT 93.2 12/02/2020 1040     Coagulation Profile: Recent Labs  Lab 12/05/20 0409  INR 1.3*    Cardiac Enzymes: No results for input(s): CKTOTAL, CKMB, CKMBINDEX, TROPONINI in the last 168 hours.  HbA1C: Hgb A1c MFr Bld  Date/Time Value Ref Range Status  12/02/2020 02:20 AM 5.0 4.8 - 5.6 % Final    Comment:    (NOTE) Pre diabetes:          5.7%-6.4%  Diabetes:              >6.4%  Glycemic control for   <7.0% adults with diabetes   09/25/2020 03:16 AM 5.4 4.8 - 5.6 % Final    Comment:    (NOTE) Pre diabetes:          5.7%-6.4%  Diabetes:              >6.4%  Glycemic control for   <7.0% adults with diabetes     CBG: Recent Labs  Lab 12/04/20 0742 12/04/20 1221 12/04/20 1658 12/04/20 2206 12/05/20 0441  GLUCAP 114* 169* 140* 152* 119*    Review of Systems:   Review of Systems  Constitutional: Positive for fever and malaise/fatigue.  Eyes: Positive for double vision.  Respiratory: Negative.   Cardiovascular: Positive for leg swelling.  Gastrointestinal: Negative.   Genitourinary: Negative.   Musculoskeletal: Positive for joint pain.  Skin: Positive for rash.  Neurological: Positive for loss of consciousness and weakness.     Past Medical History:  He,  has a past medical history of Anemia, Arthritis, COPD (chronic obstructive pulmonary disease) ([place]), Diabetes mellitus without complication ([place]), Hyperlipemia, Hypertension, Hypertriglyceridemia, Hypogonadism in male, Numbness of foot, Obesity, Tubular adenoma of colon, and Vitamin D deficiency.   Surgical History:   Past Surgical History:  Procedure Laterality Date  . CATARACT EXTRACTION W/PHACO Right 04/07/2019   Procedure: CATARACT EXTRACTION PHACO AND INTRAOCULAR LENS PLACEMENT ([place])   RIGHT DIABETIC;  Surgeon: Birder Robson, MD;  Location: [place];  Service: Ophthalmology;  Laterality: Right;  Diabetic - oral meds  . COLONOSCOPY    .  COLONOSCOPY WITH PROPOFOL N/A 05/16/2015   Procedure: COLONOSCOPY WITH PROPOFOL;  Surgeon: Manya Silvas, MD;  Location: [facility] ENDOSCOPY;  Service: Endoscopy;  Laterality: N/A;  . FEMUR IM NAIL Left 09/25/2020   Procedure: INTRAMEDULLARY (IM) NAIL HIP SCREW;  Surgeon: Meredith Pel, MD;  Location: [place];  Service: Orthopedics;  Laterality: Left;  . ORIF FINGER / THUMB FRACTURE Left    thumb, 1970s     Social History:   reports that he quit smoking about 22 years ago. He has never used smokeless tobacco. He reports current alcohol use of about 14.0 standard drinks of alcohol per week. He reports that he does not use drugs.   Family History:  His family history includes Lung cancer in his father. There is no history of Prostate cancer, Bladder Cancer, or Kidney cancer.   Allergies Allergies  Allergen Reactions  . Sulfa Antibiotics Rash     Home Medications  Prior to Admission medications   Medication Sig Start Date End Date Taking? Authorizing Provider  amLODipine (NORVASC) 10 MG tablet Take 10 mg by mouth daily.   Yes [provider]  ANORO ELLIPTA 62.5-25 MCG/INH AEPB Inhale 1 puff into the lungs daily. 11/07/20  Yes [provider]  aspirin EC 81 MG tablet Take 81 mg by mouth daily.   Yes [provider]  atenolol (TENORMIN) 50 MG tablet Take 50 mg by mouth daily.   Yes [provider]  B Complex Vitamins (VITAMIN B COMPLEX PO) Take by mouth daily.   Yes [provider]  diltiazem (CARDIZEM CD) 300 MG 24 hr capsule Take 1 capsule (300 mg total) by mouth daily. 10/03/20 10/03/21 Yes Charlynne Cousins, MD  ELIQUIS 5 MG TABS tablet Take 5 mg by mouth 2 (two) times daily. 07/28/20  Yes [provider]  empagliflozin (JARDIANCE) 25 MG TABS tablet Take 25 mg by  mouth daily.  02/23/20 02/22/21 Yes [provider]  fenofibrate 54 MG tablet Take 54 mg by mouth daily.   Yes [provider]  furosemide (LASIX) 20 MG tablet Take 20 mg by mouth daily.  02/03/18  Yes [provider]  gabapentin (NEURONTIN) 100 MG capsule Take 100 mg by mouth 3 (three) times daily.   Yes [provider]  guaiFENesin (MUCINEX) 600 MG 12 hr tablet Take 600 mg by mouth every 6 (six) hours as needed for cough.   Yes [provider]  losartan (COZAAR) 50 MG tablet Take 50 mg by mouth daily. Hold if BP is less than or equal to 100/60   Yes [provider]  metFORMIN (GLUCOPHAGE) 1000 MG tablet Take 1,000 mg by mouth 2 (two) times daily with a meal.   Yes [provider]  methocarbamol (ROBAXIN) 500 MG tablet Take 1 tablet (500 mg total) by mouth every 6 (six) hours as needed for muscle spasms. 10/03/20  Yes Charlynne Cousins, MD  oxyCODONE (OXY IR/ROXICODONE) 5 MG immediate release tablet Take 1-2 tablets (5-10 mg total) by mouth every 4 (four) hours as needed for moderate pain (pain score 4-6). 10/03/20  Yes Charlynne Cousins, MD  rosuvastatin (CRESTOR) 5 MG tablet Take 5 mg by mouth daily.   Yes [provider]  senna (SENOKOT) 8.6 MG TABS tablet Take 1 tablet by mouth in the morning and at bedtime.   Yes [provider]  VICTOZA 18 MG/3ML SOPN Inject 1.8 mg into the skin at bedtime.  12/01/16  Yes [provider]  albuterol (  VENTOLIN HFA) 108 (90 Base) MCG/ACT inhaler Inhale into the lungs every 6 (six) hours as needed for wheezing or shortness of breath. Patient not taking: No sig reported    [provider]  Blood Glucose Monitoring Suppl (GLUCOCOM BLOOD GLUCOSE MONITOR) DEVI 1 each by XX route as directed. 07/29/15   [provider]  glucose blood (ONETOUCH ULTRA) test strip Use 3 (three) times daily E11.29 08/21/19   [provider]  Insulin Pen Needle (B-D UF III  MINI PEN NEEDLES) 31G X 5 MM MISC Use once daily E11.29 11/25/19   [provider]     Critical care time:  53 min   Erick Colace ACNP-BC [place] Pager # [phone] OR # [phone] if no answer

## 2020-12-05 NOTE — Progress Notes (Signed)
Paged X [place], the [facility] on call, to inform of patient's current hyperthermia and rapid HR. Awaiting orders.

## 2020-12-05 NOTE — Progress Notes (Signed)
Pt's wife called prior to A-line placement. Pt gave verbal consent to speak with wife regarding personal health information. Wife updated on hypotension and need for further intervention to keep pt stable. Wife request of health care team: "you can do anything you need to do to help him."

## 2020-12-05 NOTE — Progress Notes (Addendum)
eLink Physician-Brief Progress Note Patient Name: Andrew Dominguez DOB: 1956-07-09 MRN: [id number]   Date of Service  12/05/2020  HPI/Events of Note  Fever - Temp = 102.9 F. Patient packed in ice. Nursing request for cooling blanket and increase Tylenol dose to Q 4 hours. AST and ALT both normal.   eICU Interventions  Plan: 1. Apply cooling blanket PRN. 2. Increase Tylenol dose to Q 4 hour PRN Temp > 101.0 F.     Intervention Category Major Interventions: Other:  Lysle Dingwall 12/05/2020, 10:46 PM

## 2020-12-06 ENCOUNTER — Inpatient Hospital Stay (HOSPITAL_COMMUNITY): Payer: 59 | Admitting: Certified Registered Nurse Anesthetist

## 2020-12-06 ENCOUNTER — Inpatient Hospital Stay (HOSPITAL_COMMUNITY): Payer: 59

## 2020-12-06 DIAGNOSIS — Z7189 Other specified counseling: Secondary | ICD-10-CM | POA: Diagnosis not present

## 2020-12-06 DIAGNOSIS — J9601 Acute respiratory failure with hypoxia: Secondary | ICD-10-CM | POA: Diagnosis not present

## 2020-12-06 DIAGNOSIS — A419 Sepsis, unspecified organism: Secondary | ICD-10-CM | POA: Diagnosis not present

## 2020-12-06 LAB — BLOOD GAS, ARTERIAL
Acid-base deficit: 16.2 mmol/L — ABNORMAL HIGH (ref 0.0–2.0)
Acid-base deficit: 12.6 mmol/L — ABNORMAL HIGH (ref 0.0–2.0)
Acid-base deficit: 15.8 mmol/L — ABNORMAL HIGH (ref 0.0–2.0)
Bicarbonate: 13.5 mmol/L — ABNORMAL LOW (ref 20.0–28.0)
Bicarbonate: 14.9 mmol/L — ABNORMAL LOW (ref 20.0–28.0)
Bicarbonate: 11.9 mmol/L — ABNORMAL LOW (ref 20.0–28.0)
Drawn by: 23532
Drawn by: 25770
FIO2: 100
FIO2: 100
FIO2: 100
MECHVT: 600 mL
MECHVT: 600 mL
O2 Saturation: 97.1 %
O2 Saturation: 73.6 %
O2 Saturation: 94.8 %
PEEP: 5 cmH2O
PEEP: 10 cmH2O
Patient temperature: 38.4
Patient temperature: 98.6
Patient temperature: 103
RATE: 35 resp/min
pCO2 arterial: 52.5 mmHg — ABNORMAL HIGH (ref 32.0–48.0)
pCO2 arterial: 41.5 mmHg (ref 32.0–48.0)
pCO2 arterial: 40 mmHg (ref 32.0–48.0)
pH, Arterial: 7.051 — CL (ref 7.350–7.450)
pH, Arterial: 7.181 — CL (ref 7.350–7.450)
pH, Arterial: 7.12 — CL (ref 7.350–7.450)
pO2, Arterial: 135 mmHg — ABNORMAL HIGH (ref 83.0–108.0)
pO2, Arterial: 48.2 mmHg — ABNORMAL LOW (ref 83.0–108.0)
pO2, Arterial: 110 mmHg — ABNORMAL HIGH (ref 83.0–108.0)

## 2020-12-06 LAB — BLOOD CULTURE ID PANEL (REFLEXED) - BCID2

## 2020-12-06 LAB — COMPREHENSIVE METABOLIC PANEL
ALT: 11 U/L (ref 0–44)
AST: 24 U/L (ref 15–41)
Albumin: 2.1 g/dL — ABNORMAL LOW (ref 3.5–5.0)
Alkaline Phosphatase: 49 U/L (ref 38–126)
Anion gap: 11 (ref 5–15)
BUN: 58 mg/dL — ABNORMAL HIGH (ref 8–23)
CO2: 14 mmol/L — ABNORMAL LOW (ref 22–32)
Calcium: 8.1 mg/dL — ABNORMAL LOW (ref 8.9–10.3)
Chloride: 115 mmol/L — ABNORMAL HIGH (ref 98–111)
Creatinine, Ser: 1.54 mg/dL — ABNORMAL HIGH (ref 0.61–1.24)
GFR, Estimated: 50 mL/min — ABNORMAL LOW (ref >60)
Glucose, Bld: 200 mg/dL — ABNORMAL HIGH (ref 70–99)
Potassium: 5.2 mmol/L — ABNORMAL HIGH (ref 3.5–5.1)
Sodium: 140 mmol/L (ref 135–145)
Total Bilirubin: 0.8 mg/dL (ref 0.3–1.2)
Total Protein: 4.6 g/dL — ABNORMAL LOW (ref 6.5–8.1)

## 2020-12-06 LAB — CBC
HCT: 39.3 % (ref 39.0–52.0)
HCT: 38.1 % — ABNORMAL LOW (ref 39.0–52.0)
Hemoglobin: 11.5 g/dL — ABNORMAL LOW (ref 13.0–17.0)
Hemoglobin: 10.9 g/dL — ABNORMAL LOW (ref 13.0–17.0)
MCH: 24 pg — ABNORMAL LOW (ref 26.0–34.0)
MCH: 23.7 pg — ABNORMAL LOW (ref 26.0–34.0)
MCHC: 29.3 g/dL — ABNORMAL LOW (ref 30.0–36.0)
MCHC: 28.6 g/dL — ABNORMAL LOW (ref 30.0–36.0)
MCV: 82 fL (ref 80.0–100.0)
MCV: 82.8 fL (ref 80.0–100.0)
Platelets: 205 10*3/uL (ref 150–400)
Platelets: 197 10*3/uL (ref 150–400)
RBC: 4.79 MIL/uL (ref 4.22–5.81)
RBC: 4.6 MIL/uL (ref 4.22–5.81)
RDW: 17.2 % — ABNORMAL HIGH (ref 11.5–15.5)
RDW: 17.3 % — ABNORMAL HIGH (ref 11.5–15.5)
WBC: 0.5 10*3/uL — CL (ref 4.0–10.5)
WBC: 0.6 10*3/uL — CL (ref 4.0–10.5)
nRBC: 0 % (ref 0.0–0.2)
nRBC: 0 % (ref 0.0–0.2)

## 2020-12-06 LAB — BASIC METABOLIC PANEL
Anion gap: 18 — ABNORMAL HIGH (ref 5–15)
BUN: 60 mg/dL — ABNORMAL HIGH (ref 8–23)
CO2: 13 mmol/L — ABNORMAL LOW (ref 22–32)
Calcium: 7.4 mg/dL — ABNORMAL LOW (ref 8.9–10.3)
Chloride: 112 mmol/L — ABNORMAL HIGH (ref 98–111)
Creatinine, Ser: 1.68 mg/dL — ABNORMAL HIGH (ref 0.61–1.24)
GFR, Estimated: 45 mL/min — ABNORMAL LOW (ref >60)
Glucose, Bld: 258 mg/dL — ABNORMAL HIGH (ref 70–99)
Potassium: 5.5 mmol/L — ABNORMAL HIGH (ref 3.5–5.1)
Sodium: 143 mmol/L (ref 135–145)

## 2020-12-06 LAB — GLUCOSE, CAPILLARY: Glucose-Capillary: 199 mg/dL — ABNORMAL HIGH (ref 70–99)

## 2020-12-06 LAB — PHOSPHORUS: Phosphorus: 5.4 mg/dL — ABNORMAL HIGH (ref 2.5–4.6)

## 2020-12-06 LAB — URIC ACID: Uric Acid, Serum: 5.7 mg/dL (ref 3.7–8.6)

## 2020-12-06 LAB — HEPARIN LEVEL (UNFRACTIONATED): Heparin Unfractionated: 0.68 IU/mL (ref 0.30–0.70)

## 2020-12-06 LAB — MAGNESIUM
Magnesium: 2 mg/dL (ref 1.7–2.4)
Magnesium: 2 mg/dL (ref 1.7–2.4)

## 2020-12-06 LAB — LACTATE DEHYDROGENASE: LDH: 337 U/L — ABNORMAL HIGH (ref 98–192)

## 2020-12-06 LAB — CK: Total CK: 32 U/L — ABNORMAL LOW (ref 49–397)

## 2020-12-06 LAB — LACTIC ACID, PLASMA: Lactic Acid, Venous: 6.1 mmol/L (ref 0.5–1.9)

## 2020-12-06 MED ORDER — AMIODARONE HCL IN DEXTROSE 360-4.14 MG/200ML-% IV SOLN
60.0000 mg/h | INTRAVENOUS | Status: AC
  Administered 2020-12-06 (×3): 60 mg/h via INTRAVENOUS
  Filled 2020-12-06 (×3): qty 200

## 2020-12-06 MED ORDER — VASOPRESSIN 20 UNITS/100 ML INFUSION FOR SHOCK
0.0000 [IU]/min | INTRAVENOUS | Status: DC
  Administered 2020-12-06: 0.04 [IU]/min via INTRAVENOUS
  Administered 2020-12-06: 0.03 [IU]/min via INTRAVENOUS
  Filled 2020-12-06 (×3): qty 100

## 2020-12-06 MED ORDER — TBO-FILGRASTIM 480 MCG/0.8ML ~~LOC~~ SOSY
480.0000 ug | PREFILLED_SYRINGE | Freq: Once | SUBCUTANEOUS | Status: AC
  Administered 2020-12-06: 480 ug via SUBCUTANEOUS
  Filled 2020-12-06: qty 0.8

## 2020-12-06 MED ORDER — SODIUM BICARBONATE 8.4 % IV SOLN
100.0000 meq | Freq: Once | INTRAVENOUS | Status: AC
  Administered 2020-12-06: 100 meq via INTRAVENOUS
  Filled 2020-12-06: qty 100

## 2020-12-06 MED ORDER — ALLOPURINOL 100 MG PO TABS
300.0000 mg | ORAL_TABLET | Freq: Every day | ORAL | Status: DC
  Administered 2020-12-06: 300 mg

## 2020-12-06 MED ORDER — FENTANYL CITRATE (PF) 100 MCG/2ML IJ SOLN
100.0000 ug | Freq: Once | INTRAMUSCULAR | Status: AC
  Administered 2020-12-06: 100 ug via INTRAVENOUS
  Filled 2020-12-06: qty 2

## 2020-12-06 MED ORDER — ROCURONIUM BROMIDE 10 MG/ML (PF) SYRINGE
PREFILLED_SYRINGE | INTRAVENOUS | Status: DC | PRN
  Administered 2020-12-06: 70 mg via INTRAVENOUS

## 2020-12-06 MED ORDER — PHENYLEPHRINE 40 MCG/ML (10ML) SYRINGE FOR IV PUSH (FOR BLOOD PRESSURE SUPPORT)
PREFILLED_SYRINGE | INTRAVENOUS | Status: AC
  Filled 2020-12-06: qty 10

## 2020-12-06 MED ORDER — FENOFIBRATE 54 MG PO TABS
54.0000 mg | ORAL_TABLET | Freq: Every day | ORAL | Status: DC
  Administered 2020-12-06: 54 mg
  Filled 2020-12-06: qty 1

## 2020-12-06 MED ORDER — SUCCINYLCHOLINE CHLORIDE 20 MG/ML IJ SOLN
INTRAMUSCULAR | Status: DC | PRN
  Administered 2020-12-06: 160 mg via INTRAVENOUS

## 2020-12-06 MED ORDER — IPRATROPIUM-ALBUTEROL 0.5-2.5 (3) MG/3ML IN SOLN
3.0000 mL | RESPIRATORY_TRACT | Status: DC
  Administered 2020-12-06 (×2): 3 mL via RESPIRATORY_TRACT
  Filled 2020-12-06 (×2): qty 3

## 2020-12-06 MED ORDER — SUCCINYLCHOLINE CHLORIDE 200 MG/10ML IV SOSY
PREFILLED_SYRINGE | INTRAVENOUS | Status: AC
  Filled 2020-12-06: qty 20

## 2020-12-06 MED ORDER — B COMPLEX-C PO TABS
1.0000 | ORAL_TABLET | Freq: Every day | ORAL | Status: DC
  Administered 2020-12-06: 1
  Filled 2020-12-06: qty 1

## 2020-12-06 MED ORDER — SUCCINYLCHOLINE CHLORIDE 200 MG/10ML IV SOSY
PREFILLED_SYRINGE | INTRAVENOUS | Status: AC
  Filled 2020-12-06: qty 10

## 2020-12-06 MED ORDER — ROSUVASTATIN CALCIUM 10 MG PO TABS
5.0000 mg | ORAL_TABLET | Freq: Every day | ORAL | Status: DC
  Administered 2020-12-06: 5 mg
  Filled 2020-12-06: qty 1

## 2020-12-06 MED ORDER — GABAPENTIN 250 MG/5ML PO SOLN
100.0000 mg | Freq: Three times a day (TID) | ORAL | Status: DC
  Filled 2020-12-06 (×2): qty 2

## 2020-12-06 MED ORDER — MIDAZOLAM HCL 2 MG/2ML IJ SOLN
4.0000 mg | Freq: Once | INTRAMUSCULAR | Status: AC
  Administered 2020-12-06: 4 mg via INTRAVENOUS
  Filled 2020-12-06: qty 4

## 2020-12-06 MED ORDER — ETOMIDATE 2 MG/ML IV SOLN
INTRAVENOUS | Status: DC | PRN
  Administered 2020-12-06: 20 mg via INTRAVENOUS

## 2020-12-06 MED ORDER — AMIODARONE LOAD VIA INFUSION
150.0000 mg | Freq: Once | INTRAVENOUS | Status: AC
  Administered 2020-12-06: 150 mg via INTRAVENOUS
  Filled 2020-12-06: qty 83.34

## 2020-12-06 MED ORDER — ETOMIDATE 2 MG/ML IV SOLN
INTRAVENOUS | Status: AC
  Filled 2020-12-06: qty 10

## 2020-12-06 MED ORDER — GLYCOPYRROLATE 1 MG PO TABS: 1.0000 mg | ORAL_TABLET | ORAL | Status: DC | PRN

## 2020-12-06 MED ORDER — POLYVINYL ALCOHOL 1.4 % OP SOLN
1.0000 [drp] | Freq: Four times a day (QID) | OPHTHALMIC | Status: DC | PRN
  Filled 2020-12-06: qty 15

## 2020-12-06 MED ORDER — SODIUM BICARBONATE 8.4 % IV SOLN
100.0000 meq | Freq: Once | INTRAVENOUS | Status: AC
  Administered 2020-12-06: 100 meq via INTRAVENOUS

## 2020-12-06 MED ORDER — TOBRAMYCIN SULFATE 80 MG/2ML IJ SOLN
7.0000 mg/kg | INTRAVENOUS | Status: DC
  Administered 2020-12-06: 680 mg via INTRAVENOUS
  Filled 2020-12-06: qty 17

## 2020-12-06 MED ORDER — ROCURONIUM BROMIDE 10 MG/ML (PF) SYRINGE
1.0000 mg/kg | PREFILLED_SYRINGE | Freq: Once | INTRAVENOUS | Status: AC
  Administered 2020-12-06: 127.7 mg via INTRAVENOUS
  Filled 2020-12-06: qty 20
  Filled 2020-12-06: qty 12.77

## 2020-12-06 MED ORDER — PHENYLEPHRINE HCL-NACL 10-0.9 MG/250ML-% IV SOLN
0.0000 ug/min | INTRAVENOUS | Status: DC
  Administered 2020-12-06: 330 ug/min via INTRAVENOUS
  Administered 2020-12-06: 300 ug/min via INTRAVENOUS
  Administered 2020-12-06 (×2): 400 ug/min via INTRAVENOUS
  Administered 2020-12-06: 250 ug/min via INTRAVENOUS
  Administered 2020-12-06: 220 ug/min via INTRAVENOUS
  Administered 2020-12-06: 100 ug/min via INTRAVENOUS
  Administered 2020-12-06: 180 ug/min via INTRAVENOUS
  Administered 2020-12-06: 200 ug/min via INTRAVENOUS
  Administered 2020-12-06 (×2): 400 ug/min via INTRAVENOUS
  Administered 2020-12-06: 20 ug/min via INTRAVENOUS
  Administered 2020-12-06: 190 ug/min via INTRAVENOUS
  Administered 2020-12-06: 400 ug/min via INTRAVENOUS
  Filled 2020-12-06 (×4): qty 250
  Filled 2020-12-06: qty 500
  Filled 2020-12-06 (×9): qty 250

## 2020-12-06 MED ORDER — AMIODARONE HCL IN DEXTROSE 360-4.14 MG/200ML-% IV SOLN: 30.0000 mg/h | INTRAVENOUS | Status: DC

## 2020-12-06 MED ORDER — EPINEPHRINE HCL 5 MG/250ML IV SOLN IN NS
0.5000 ug/min | INTRAVENOUS | Status: DC
  Administered 2020-12-06: 13 ug/min via INTRAVENOUS
  Administered 2020-12-06: 09:00:00 15 ug/min via INTRAVENOUS
  Filled 2020-12-06: qty 250

## 2020-12-06 MED ORDER — SODIUM BICARBONATE 8.4 % IV SOLN
200.0000 meq | Freq: Once | INTRAVENOUS | Status: AC
  Administered 2020-12-06: 200 meq via INTRAVENOUS
  Filled 2020-12-06: qty 200

## 2020-12-06 MED ORDER — AMIODARONE IV BOLUS ONLY 150 MG/100ML
150.0000 mg | Freq: Once | INTRAVENOUS | Status: AC
  Administered 2020-12-06: 150 mg via INTRAVENOUS

## 2020-12-06 MED ORDER — SENNOSIDES 8.8 MG/5ML PO SYRP
5.0000 mL | ORAL_SOLUTION | Freq: Every day | ORAL | Status: DC
  Filled 2020-12-06: qty 5

## 2020-12-06 MED ORDER — GLYCOPYRROLATE 0.2 MG/ML IJ SOLN: 0.2000 mg | INTRAMUSCULAR | Status: DC | PRN

## 2020-12-06 MED ORDER — TBO-FILGRASTIM 480 MCG/0.8ML ~~LOC~~ SOSY: 480.0000 ug | PREFILLED_SYRINGE | Freq: Every day | SUBCUTANEOUS | Status: DC

## 2020-12-06 MED ORDER — FENTANYL 2500MCG IN NS 250ML (10MCG/ML) PREMIX INFUSION
0.0000 ug/h | INTRAVENOUS | Status: DC
  Administered 2020-12-06: 25 ug/h via INTRAVENOUS
  Filled 2020-12-06: qty 250

## 2020-12-06 MED ORDER — ROCURONIUM BROMIDE 10 MG/ML (PF) SYRINGE
PREFILLED_SYRINGE | INTRAVENOUS | Status: AC
  Filled 2020-12-06: qty 10

## 2020-12-06 MED ORDER — EPINEPHRINE HCL 5 MG/250ML IV SOLN IN NS
INTRAVENOUS | Status: AC
  Administered 2020-12-06: 09:00:00 4 mg
  Filled 2020-12-06: qty 250

## 2020-12-06 MED ORDER — DEXTROSE 5 % IV SOLN: INTRAVENOUS | Status: DC

## 2020-12-06 MED ORDER — SODIUM CHLORIDE 0.9 % IV SOLN
3.0000 g | Freq: Four times a day (QID) | INTRAVENOUS | Status: DC
  Administered 2020-12-06: 3 g via INTRAVENOUS
  Filled 2020-12-06 (×3): qty 8

## 2020-12-06 DEATH — deceased

## 2020-12-07 ENCOUNTER — Telehealth: Payer: Self-pay | Admitting: *Deleted

## 2020-12-07 LAB — CULTURE, BLOOD (ROUTINE X 2)
Special Requests: ADEQUATE
Special Requests: ADEQUATE

## 2020-12-07 NOTE — Telephone Encounter (Signed)
Call from [facility] to let Dr Mike Gip know that patient expired.

## 2020-12-13 ENCOUNTER — Ambulatory Visit: Payer: 59 | Admitting: Hematology and Oncology

## 2021-01-03 NOTE — Consult Note (Signed)
Consultation Note Date: Dec 16, 2020   Patient Name: Andrew Dominguez  DOB: December 31, 1955  MRN: [id number]  Age / Sex: 65 y.o., male  PCP: Baxter Hire, MD Referring Physician: Terrilee Croak, MD  Reason for Consultation: Establishing goals of care  HPI/Patient Profile: 65 y.o. male  with past medical history of diabetes, hypertension, hyperlipidemia, A. fib on Eliquis, COPD, pulmonary nodule, large B-cell lymphoma, lytic bone lesions, liver masses, splenic masses, left kidney mass who was recently admitted in November 2021 for hip fracture underwent surgical fixation.  He has been residing at [facility] was sent to the ED for worsening lethargy, abnormal calcium, and hemoglobin.  Admitted on 11/26/2020 with hypercalcemia and sepsis and was transferred to ICU.  Palliative consulted for goals of care in light of sepsis and widely metastatic underlying malignancy  Clinical Assessment and Goals of Care: I saw and examined Andrew Dominguez.  He is lying in bed and was lethargic and cannot really participate in conversation.  I called was able to reach his significant other, Mandy.  I introduced palliative care as specialized medical care for people living with serious illness. It focuses on providing relief from the symptoms and stress of a serious illness. The goal is to improve quality of life for both the patient and the family.  Leafy Ro reports that she and Andrew Dominguez have been together for 86 years and have a daughter and son together.  They were legally married, but then got divorced.  They have been back together for several years now and have been planning to get remarried, but they have "not quite gotten around to it yet."  Whenever he became ill, he did complete healthcare power of attorney paperwork naming Leafy Ro as his surrogate Media planner.  She reports she has a copy of this with her when she comes to visit and  I asked that she ask staff to make a copy in place and on his chart so we have a copy of it as well.  She tells me that Andrew Dominguez is a very mild and calm mannered individual.  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  His hobbies include fishing and hunting although he has not been able to go for a couple of years due to his current medical condition.  He also enjoyed riding motorcycles and has a [employer] fast streamer that he loved but recently sold to his son as he cannot ride anymore.  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  As noted, he has a son and daughter as well as grandchildren and a great grandchild.  His mother is still living at age of 59.  His father died around 3 years ago.  We discussed clinical course as well as wishes moving forward in regard to care plan this hospitalization.  Values and goals of care important to patient and family were attempted to be elicited.  Leafy Ro seems to have a good understanding about the severity of his condition.  She is frustrated by what she feels  is a lack of compassionate, quality care at the facility where he was prior to admission.  We talked about his acute illness and how this relates to a larger problem of underlying lymphoma.  Leafy Ro tells me that she thinks that Andrew Dominguez has continued to decline quickly and she "has a bad feeling" he may not recover from this hospitalization.  Concept of Hospice and Palliative Care were discussed  Questions and concerns addressed.   PMT will continue to support holistically.  SUMMARY OF RECOMMENDATIONS   - Full code/Full scope -I spoke today with Andrew Dominguez significant other/HC POA, Mandy.  She reports that he has stated that he would want aggressive interventions and therefore she would like to maintain FULL CODE status at this point.  However, he would not want to be maintained long-term on life support if he has a condition that is not going to be reversible to a point where he can have meaningful interaction with  family.  He would not want to be considered for a tracheostomy/PEG tube per her report. Leafy Ro reports appreciating direct conversation and she expressed that she feels it is likely Andrew Dominguez is not going to recover from his current situation.  She would like him to be intubated if necessary overnight (we discussed that is likely to happen), but she would also appreciate realistic views about his likely clinical course if he continues to decompensate.  She expressed that a primary desire of the family is to be able to come to say their goodbyes to him in person if it appears this is not going to be recoverable event.   -I expressed my concern that I fear this is the beginning of events from which he may not recover.  Plan is to maintain current CODE STATUS with understanding there is a high likelihood he may need to be intubated sometime overnight.  If this is in fact the case, I told her that we would continue to evaluate his overall condition and be as honest with her as possible if this does not appear to be recoverable event.  She again stated that he would not want to "prolong" things if he is not going to have meaningful recovery. -While I am going off service, I will ask another member of the palliative care team to follow along with Andrew Dominguez and his family.  Code Status/Advance Care Planning:  Full code  Prognosis:   Guarded  Discharge Planning: To Be Determined      Primary Diagnoses: Present on Admission: . Hypercalcemia . Atrial fibrillation with RVR ([place]) . Bilateral leg edema . Liver metastases ([place]) . Hyperlipidemia, unspecified . Obesity   I have reviewed the medical record, interviewed the patient and family, and examined the patient. The following aspects are pertinent.  Past Medical History:  Diagnosis Date  . Anemia   . Arthritis    knees  . COPD (chronic obstructive pulmonary disease) ([place])   . Diabetes mellitus without complication ([place])   . Hyperlipemia   .  Hypertension   . Hypertriglyceridemia   . Hypogonadism in male   . Numbness of foot    bilateral  . Obesity   . Tubular adenoma of colon   . Vitamin D deficiency    Social History   Socioeconomic History  . Marital status: Legally Separated    Spouse name: Not on file  . Number of children: Not on file  . Years of education: Not on file  . Highest education level: Not  on file  Occupational History  . Not on file  Tobacco Use  . Smoking status: Former Smoker    Quit date: 2000    Years since quitting: 22.1  . Smokeless tobacco: Never Used  Vaping Use  . Vaping Use: Never used  Substance and Sexual Activity  . Alcohol use: Yes    Alcohol/week: 14.0 standard drinks    Types: 14 Shots of liquor per week  . Drug use: No  . Sexual activity: Not on file  Other Topics Concern  . Not on file  Social History Narrative  . Not on file   Social Determinants of Health   Financial Resource Strain: Not on file  Food Insecurity: Not on file  Transportation Needs: Not on file  Physical Activity: Not on file  Stress: Not on file  Social Connections: Not on file   Family History  Problem Relation Age of Onset  . Lung cancer Father   . Prostate cancer Neg Hx   . Bladder Cancer Neg Hx   . Kidney cancer Neg Hx    Scheduled Meds: . (feeding supplement) PROSource Plus  30 mL Oral TID BM  . allopurinol  300 mg Oral Daily  . B-complex with vitamin C   Oral Daily  . Chlorhexidine Gluconate Cloth  6 each Topical Daily  . Chlorhexidine Gluconate Cloth  6 each Topical Q0600  . feeding supplement (GLUCERNA SHAKE)  237 mL Oral TID BM  . fenofibrate  54 mg Oral Daily  . gabapentin  100 mg Oral TID  . insulin aspart  0-20 Units Subcutaneous TID WC  . insulin aspart  0-5 Units Subcutaneous QHS  . mouth rinse  15 mL Mouth Rinse BID  . methylPREDNISolone (SOLU-MEDROL) injection  80 mg Intravenous Daily  . pantoprazole  40 mg Intravenous QHS  . rosuvastatin  5 mg Oral Daily  . senna  1  tablet Oral QHS  . sodium chloride flush  10-40 mL Intracatheter Q12H  . Tbo-filgastrim (GRANIX) SQ  480 mcg Subcutaneous ONCE-1800  . umeclidinium-vilanterol  1 puff Inhalation Daily   Continuous Infusions: . sodium chloride Stopped (12/05/20 0438)  . sodium chloride    . sodium chloride    . sodium chloride Stopped (12/05/20 1752)  . sodium chloride    . amiodarone 60 mg/hr (12/14/20 0312)   Followed by  . amiodarone    . amiodarone    . ampicillin-sulbactam (UNASYN) IV    . fentaNYL infusion INTRAVENOUS 25 mcg/hr (Dec 14, 2020 0420)  . heparin 1,400 Units/hr (12/05/20 2308)  . norepinephrine (LEVOPHED) Adult infusion 18 mcg/min (December 14, 2020 0544)  . phenylephrine (NEO-SYNEPHRINE) Adult infusion 200 mcg/min (Dec 14, 2020 0545)  .  sodium bicarbonate (isotonic) infusion in sterile water 50 mL/hr at 12/14/2020 0518  . tobramycin 680 mg (12-14-2020 0553)  . vasopressin     PRN Meds:.Place/Maintain arterial line **AND** sodium chloride, acetaminophen, acetaminophen, albuterol, morphine injection, ondansetron **OR** ondansetron (ZOFRAN) IV, oxyCODONE, sodium chloride flush Medications Prior to Admission:  Prior to Admission medications   Medication Sig Start Date End Date Taking? Authorizing Provider  amLODipine (NORVASC) 10 MG tablet Take 10 mg by mouth daily.   Yes [provider]  ANORO ELLIPTA 62.5-25 MCG/INH AEPB Inhale 1 puff into the lungs daily. 11/07/20  Yes [provider]  aspirin EC 81 MG tablet Take 81 mg by mouth daily.   Yes [provider]  atenolol (TENORMIN) 50 MG tablet Take 50 mg by mouth daily.   Yes [provider]  B Complex Vitamins (VITAMIN B COMPLEX PO) Take by mouth daily.   Yes [provider]  diltiazem (CARDIZEM CD) 300 MG 24 hr capsule Take 1 capsule (300 mg total) by mouth daily. 10/03/20 10/03/21 Yes Charlynne Cousins, MD  ELIQUIS 5 MG TABS tablet Take 5 mg by mouth 2 (two) times daily. 07/28/20  Yes [provider]  empagliflozin (JARDIANCE) 25 MG TABS tablet Take 25 mg by mouth daily.  02/23/20 02/22/21 Yes [provider]  fenofibrate 54 MG tablet Take 54 mg by mouth daily.   Yes [provider]  furosemide (LASIX) 20 MG tablet Take 20 mg by mouth daily.  02/03/18  Yes [provider]  gabapentin (NEURONTIN) 100 MG capsule Take 100 mg by mouth 3 (three) times daily.   Yes [provider]  guaiFENesin (MUCINEX) 600 MG 12 hr tablet Take 600 mg by mouth every 6 (six) hours as needed for cough.   Yes [provider]  losartan (COZAAR) 50 MG tablet Take 50 mg by mouth daily. Hold if BP is less than or equal to 100/60   Yes [provider]  metFORMIN (GLUCOPHAGE) 1000 MG tablet Take 1,000 mg by mouth 2 (two) times daily with a meal.   Yes [provider]  methocarbamol (ROBAXIN) 500 MG tablet Take 1 tablet (500 mg total) by mouth every 6 (six) hours as needed for muscle spasms. 10/03/20  Yes Charlynne Cousins, MD  oxyCODONE (OXY IR/ROXICODONE) 5 MG immediate release tablet Take 1-2 tablets (5-10 mg total) by mouth every 4 (four) hours as needed for moderate pain (pain score 4-6). 10/03/20  Yes Charlynne Cousins, MD  rosuvastatin (CRESTOR) 5 MG tablet Take 5 mg by mouth daily.   Yes [provider]  senna (SENOKOT) 8.6 MG TABS tablet Take 1 tablet by mouth in the morning and at bedtime.   Yes [provider]  VICTOZA 18 MG/3ML SOPN Inject 1.8 mg into the skin at bedtime.  12/01/16  Yes [provider]  albuterol (VENTOLIN HFA) 108 (90 Base) MCG/ACT inhaler Inhale into the lungs every 6 (six) hours as needed for wheezing or shortness of breath. Patient not taking: No sig reported    [provider]  Blood Glucose Monitoring Suppl (GLUCOCOM BLOOD GLUCOSE MONITOR) DEVI 1 each by XX route as directed. 07/29/15   [provider]  glucose blood (ONETOUCH ULTRA) test strip Use 3 (three) times daily E11.29 08/21/19    [provider]  Insulin Pen Needle (B-D UF III MINI PEN NEEDLES) 31G X 5 MM MISC Use once daily E11.29 11/25/19   [provider]   Allergies  Allergen Reactions  . Sulfa Antibiotics Rash   Review of Systems  Unable to obtain  Physical Exam  General: Sleepy but arousable HEENT: No bruits, no goiter, no JVD Heart: Tachy, regular. No murmur appreciated. Lungs: Fair air movement, scattered coarse Abdomen: Soft, nontender, nondistended, positive bowel sounds.  Ext: Some edema Skin: Warm and dry  Vital Signs: BP 90/60   Pulse (!) 140   Temp (!) 101.12 F (38.4 C) (Bladder)   Resp 18   Ht 6' (1.829 m)   Wt 127.7 kg   SpO2 98%   BMI 38.18 kg/m  Pain Scale: 0-10   Pain Score: Asleep   SpO2: SpO2: 98 % O2 Device:SpO2: 98 % O2 Flow Rate: .O2 Flow Rate (L/min): 2 L/min  IO: Intake/output summary:   Intake/Output Summary (Last 24 hours) at 12/13/20 0600  Last data filed at 12/20/2020 0156 Gross per 24 hour  Intake 8393.23 ml  Output 340 ml  Net 8053.23 ml    LBM: Last BM Date: 12/04/20 Baseline Weight: Weight: 132.5 kg Most recent weight: Weight: 127.7 kg     Palliative Assessment/Data:   Flowsheet Rows   Flowsheet Row Most Recent Value  Intake Tab   Referral Department Hospitalist  Unit at Time of Referral ICU  Palliative Care Primary Diagnosis Sepsis/Infectious Disease  Date Notified 12/05/20  Palliative Care Type New Palliative care  Reason for referral Clarify Goals of Care  Date of Admission 11/30/2020  Date first seen by Palliative Care 12/05/20  # of days Palliative referral response time 0 Day(s)  # of days IP prior to Palliative referral 4  Clinical Assessment   Palliative Performance Scale Score 30%  Psychosocial & Spiritual Assessment   Palliative Care Outcomes   Patient/Family meeting held? Yes  Who was at the meeting? [facility]CPOA via phone      Time In: 1730 Time Out: 1840 Time Total: 70 Greater than 50%  of this time was spent  counseling and coordinating care related to the above assessment and plan.  Signed by: Micheline Rough, MD   Please contact Palliative Medicine Team phone at [phone] for questions and concerns.  For individual provider: See Shea Evans

## 2021-01-03 NOTE — Progress Notes (Signed)
CRITICAL VALUE ALERT  Critical Value:  WBC 0.6  Date & Time Notied:  12/07/20 8:19 AM  Provider Notified: Gaylyn Lambert, NP  Orders Received/Actions taken: Continue current treatment plan

## 2021-01-03 NOTE — Death Summary Note (Signed)
Physician Discharge Summary  Patient ID: Andrew Dominguez MRN: [id number] DOB/AGE: 1956/06/17 65 y.o.  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 Severe hypercalcemia COPD History of hip fracture Lactic acidosis AKI Acute hypoxic respiratory failure during mechanical ventilation Hyperglycemia Hyperkalemia Lymphopenia Chronic anemia Acute metabolic encephalopathy chronic anemia of chronic disease  Discharged Condition: Deceased  Hospital Course:  Andrew Dominguez is a 65 year old gentleman who was admitted on 11/19/2020 after being brought from SNF to the hospital for acute encephalopathy, weakness, lower extremity edema.  He was found to have severe hypercalcemia.  To be in tumor lysis syndrome spontaneously.  His hypercalcemia was managed medically with hydration, Zometa, calcitonin.  Due to concern for uncontrolled and severely progressed lymphoma he was started emergently on R-CHOP for lymphoma.  He unfortunately developed septic shock subsequently, likely due to cellulitis versus a deep infection of his left leg.  CT scan was not conclusive of a deep infection; diagnosis made on clinical exam.  No other obvious sources of infection at the time.  He clinically  deteriorated quickly from 1/31-2/1.  He developed severe metabolic acidosis with increased work of breathing prompting intubation.  He developed refractory septic shock requiring 4 vasopressors.  He had persistent lactic acidosis despite aggressive antibiotics.  Despite aggressive management with mechanical ventilation, antibiotics, pressors, all supportive care his family made the decision to withdraw aggressive care and focus on comfort.  He expired on Dec 27, 2020 at 14:40 with family at bedside.   Consults:  Oncology Pulmonary and critical care medicine Orthopedics Interventional radiology Palliative care  Significant Diagnostic Studies:  Blood culture 1/31> GNR; biofire positive for A.calcoaceticus-baumannii  CT pelvis 12/05/20> IMPRESSION: No discrete fluid collection identified within the soft tissues of the pelvis. Asymmetric edema of the proximal left thigh again identified. Multiple soft tissue and lytic osseous metastatic foci again identified, unchanged from prior examination.  CT head 12/05/20> IMPRESSION: Normal head CT for age. Mild age related volume loss.  CT chest, abd, pelvis 12/02/20> IMPRESSION: 1. Overall significant progression of malignancy. There is enlargement of the widespread hepatic masses; new and enlarging sub solid pulmonary nodules; new and enlarging thoracic and abdominal adenopathy; enlarging lytic destructive lesions of the proximal femurs and left iliac bone; new mass in the left gluteus maximus muscle; and similar large mass in the spleen but with some new soft tissue nodules along the medial spleen. 2. Large right and small left pleural effusions with passive atelectasis. 3. Other imaging findings of potential clinical significance: Coronary, aortic arch, and branch vessel atherosclerotic vascular disease. Cholelithiasis. Mild perihepatic and pelvic ascites. Subcutaneous edema along the flanks. Small left adrenal myelolipoma. Small amount of  pelvic ascites. 4. Aortic atherosclerosis.   Treatments:  Vasopressors, antibiotics, IV fluids Mechanical ventilation, bronchodilators Sedation, analgesia Chemotherapy-R-CHOP Insulin Rate control medications for atrial fibrillation, anticoagulation   Disposition: Funeral home of the family's choosing     Signed:  Julian Hy 12-23-2020, 3:03 PM

## 2021-01-03 NOTE — Progress Notes (Signed)
eLink Physician-Brief Progress Note Patient Name: Andrew Dominguez DOB: [birth year] MRN: [id number]   Date of Service  December 20, 2020  HPI/Events of Note  AFIB with RVR - Ventricular rate = 130-140. BP = 102/51. Patient on Norepinephrine IV infusion.  Last K+ = 5.3 at 5:08 PM.   eICU Interventions  Plan: 1. Amiodarone IV load and infusion. 2. Mg++ level STAT. 3. Phenylephrine iV infusion. Titrate to MAP >= 65. 4. Wean Norepinephrine IV infusion off as tolerated.      Intervention Category Major Interventions: Arrhythmia - evaluation and management  Andrew Dominguez 12/20/20, 12:07 AM

## 2021-01-03 NOTE — Progress Notes (Signed)
PHARMACY - PHYSICIAN COMMUNICATION CRITICAL VALUE ALERT - BLOOD CULTURE IDENTIFICATION (BCID)  Andrew Dominguez is an 65 y.o. male who presented to [facility] on 11/23/2020 with large B cell lymphoma, recent left hip fracture ORIF admitted with fever and altered mental status  Assessment:  + Acinetobacter baumannii in 3/4 vials  Name of physician (or Provider) Contacted: X. Blount, FNP  Current antibiotics: Vancomycin 1gm IV q12h and Zosyn 3.375gm IV q8h (extended infusion)  Changes to prescribed antibiotics recommended:  Recommendations accepted by provider : D/C Vancomycin and Zosyn and per Rapid ID treatment algorithm: treat with Unasyn along with Tobramycin, as patient currently on pressors with sepsis  Results for orders placed or performed during the hospital encounter of 12/02/2020  Blood Culture ID Panel (Reflexed) (Collected: 12/05/2020 10:13 AM)  Result Value Ref Range   Enterococcus faecalis NOT DETECTED NOT DETECTED   Enterococcus Faecium NOT DETECTED NOT DETECTED   Listeria monocytogenes NOT DETECTED NOT DETECTED   Staphylococcus species NOT DETECTED NOT DETECTED   Staphylococcus aureus (BCID) NOT DETECTED NOT DETECTED   Staphylococcus epidermidis NOT DETECTED NOT DETECTED   Staphylococcus lugdunensis NOT DETECTED NOT DETECTED   Streptococcus species NOT DETECTED NOT DETECTED   Streptococcus agalactiae NOT DETECTED NOT DETECTED   Streptococcus pneumoniae NOT DETECTED NOT DETECTED   Streptococcus pyogenes NOT DETECTED NOT DETECTED   A.calcoaceticus-baumannii DETECTED (A) NOT DETECTED   Bacteroides fragilis NOT DETECTED NOT DETECTED   Enterobacterales NOT DETECTED NOT DETECTED   Enterobacter cloacae complex NOT DETECTED NOT DETECTED   Escherichia coli NOT DETECTED NOT DETECTED   Klebsiella aerogenes NOT DETECTED NOT DETECTED   Klebsiella oxytoca NOT DETECTED NOT DETECTED   Klebsiella pneumoniae NOT DETECTED NOT DETECTED   Proteus species NOT DETECTED NOT DETECTED    Salmonella species NOT DETECTED NOT DETECTED   Serratia marcescens NOT DETECTED NOT DETECTED   Haemophilus influenzae NOT DETECTED NOT DETECTED   Neisseria meningitidis NOT DETECTED NOT DETECTED   Pseudomonas aeruginosa NOT DETECTED NOT DETECTED   Stenotrophomonas maltophilia NOT DETECTED NOT DETECTED   Candida albicans NOT DETECTED NOT DETECTED   Candida auris NOT DETECTED NOT DETECTED   Candida glabrata NOT DETECTED NOT DETECTED   Candida krusei NOT DETECTED NOT DETECTED   Candida parapsilosis NOT DETECTED NOT DETECTED   Candida tropicalis NOT DETECTED NOT DETECTED   Cryptococcus neoformans/gattii NOT DETECTED NOT DETECTED   CTX-M ESBL NOT DETECTED NOT DETECTED   Carbapenem resistance IMP NOT DETECTED NOT DETECTED   Carbapenem resistance KPC NOT DETECTED NOT DETECTED   Carbapenem resistance NDM NOT DETECTED NOT DETECTED   Carbapenem resistance VIM NOT DETECTED NOT DETECTED    Everette Rank, PharmD Jan 01, 2021  3:06 AM

## 2021-01-03 NOTE — Progress Notes (Signed)
Notified Lab that ABG being sent for analysis. 

## 2021-01-03 NOTE — Anesthesia Procedure Notes (Signed)
Procedure Name: Intubation Date/Time: 12-25-20 4:07 AM Performed by: Gerald Leitz, CRNA Pre-anesthesia Checklist: Patient identified, Patient being monitored, Timeout performed, Emergency Drugs available and Suction available Patient Re-evaluated:Patient Re-evaluated prior to induction Oxygen Delivery Method: Circle system utilized Preoxygenation: Pre-oxygenation with 100% oxygen Induction Type: IV induction and Rapid sequence Ventilation: Mask ventilation without difficulty Laryngoscope Size: Mac and 3 Grade View: Grade I Tube type: Oral Tube size: 8.0 mm Number of attempts: 1 Airway Equipment and Method: Stylet and Video-laryngoscopy Placement Confirmation: ETT inserted through vocal cords under direct vision,  positive ETCO2 and breath sounds checked- equal and bilateral Secured at: 24 cm Tube secured with: Tape Dental Injury: Teeth and Oropharynx as per pre-operative assessment

## 2021-01-03 NOTE — Progress Notes (Addendum)
Nutrition Follow-up  DOCUMENTATION CODES:   Obesity unspecified  INTERVENTION:  - will monitor plan and future nutrition-related needs.  NUTRITION DIAGNOSIS:   Inadequate oral intake related to inability to eat as evidenced by NPO status. -revised, ongoing  GOAL:   Provide needs based on ASPEN/SCCM guidelines -to be met with TF regimen  MONITOR:   Vent status,TF tolerance,Labs,Weight trends,Skin  REASON FOR ASSESSMENT:   Ventilator,Consult Enteral/tube feeding initiation and management  ASSESSMENT:   28 YOM presented to ED with abnormal lab (high calcium), fatigue and edema. Pt admitted for hypercalcemia of malignancy. PMH of metastatic lung cancer, liver masses, pulmonary nodules, lytic bone lesions (no treatment plan currently in place); HTN, HLD, arthritis, COPD, hip fracture s/p surgery.  Significant Events:  1/27- admission 1/28- initial RD assessment 1/29- chemo initiated 1/31- Rapid Response d/t elevated HR; transferred from 6E to 2W; CT L hip; NGT placement (gastric)  Patient discussed in rounds this AM.   Patient was intubated by Anesthesia today at ~0420. NGT remains in place. Family is at bedside.   Weight yesterday was -11 lb compared to weight on 1/28. Mild pitting edema to BUE and deep pitting edema to BLE documented in the edema section of flow sheet.   Per notes: - ARDS protocol - circulatory shock, SIRS - L hip cellulitis - acute metabolic encepephalopathy  - metastatic large B-cell lymphoma  - R pleural effusion - double vision  - AKI    Patient is currently intubated on ventilator support MV: 17.6 L/min Temp (24hrs), Avg:100.6 F (38.1 C), Min:98.8 F (37.1 C), Max:103.46 F (39.7 C) Propofol: none  Labs reviewed; CNG: 199 mg/dl, K: 5.5 mmol/l, Cl: 112 mmol/l, BUN: 60 mg/dl, creatinine: 1.68 mg/dl, Ca: 7.4 mg/dl, GFR: 45 ml/min.  Medications reviewed; 1 tablet B-complex with V/day, sliding scale novolog, 4 g IV Mg sulfate x1 run 1/31,  80 mg solu-medrol/day, 40 mg IV protonix/day, 10 mEq IV KCl x4 runs 1/31, 5 ml senokot per NGT/day, 10 g lokelma x1 dose 1/31. IVF; NS @ 150 ml/hr. Drips; epi @ 13 mcg/min, levo @ 40 mcg/min, amio @ 30 mg/hr, fentanyl @ 100 mcg/hr, neo @ 250 mcg/min, vaso @ 0.04 units/min.     Diet Order:   Diet Order            Diet NPO time specified  Diet effective midnight                 EDUCATION NEEDS:   No education needs have been identified at this time  Skin:  Skin Assessment: Skin Integrity Issues: Skin Integrity Issues:: Other (Comment) Other: sacral pressure injury (1/28)  Last BM:  1/30  Height:   Ht Readings from Last 1 Encounters:  12/02/20 6' (1.829 m)    Weight:   Wt Readings from Last 1 Encounters:  12/05/20 127.7 kg    Estimated Nutritional Needs:  Kcal:  1405-1790 kcal Protein:  >/= 162 grams Fluid:  >2.2 L     Jarome Matin, MS, RD, LDN, CNSC Inpatient Clinical Dietitian RD pager # available in [place]  After hours/weekend pager # available in [facility]

## 2021-01-03 NOTE — Transfer of Care (Signed)
Immediate Anesthesia Transfer of Care Note  Patient: Andrew Dominguez  Procedure(s) Performed: AN AD Treasure INTUBATION  Patient Location: ICU  Anesthesia Type:General  Level of Consciousness: Intubated  Airway & Oxygen Therapy: Patient placed on Ventilator (see vital sign flow sheet for setting)  Post-op Assessment: Report given to RN  Post vital signs: stable  Last Vitals:  Vitals Value Taken Time  BP 90/60 12/24/20 0414  Temp 38.2 C 2020-12-24 0425  Pulse    Resp 18 December 24, 2020 0425  SpO2    Vitals shown include unvalidated device data.  Last Pain:  Vitals:   12/24/2020 0300  TempSrc: Bladder  PainSc:       Patients Stated Pain Goal: 0 (09/21/34 6701)  Complications: No complications documented.

## 2021-01-03 NOTE — Progress Notes (Signed)
eLink Physician-Brief Progress Note Patient Name: Andrew Dominguez DOB: 12-11-1955 MRN: [id number]   Date of Service  12-15-2020  HPI/Events of Note  Nursing needs more venous access and requests exchange or double lumen PICC for 3 lumen PICC line.   eICU Interventions  Plan: 1. Exchange double lumen R Basilic vein PICC line for 3 lumen PICC line.      Intervention Category Major Interventions: Other:  Waylan Busta Cornelia Copa 12-15-2020, 3:09 AM

## 2021-01-03 NOTE — Significant Event (Cosign Needed Addendum)
11-Dec-2020 1221 hrs.  Along with Dr. Carlis Abbott and Richardson Landry Aloha Bartok nurse practitioner met with family concerning goals of care. The wife and daughter are comfortable with goals of care being comfort. Extensive time spent with them and extended family concerning current situation with poor prognosis and multiorgan dysfunction and they agreed that he would not want further escalation of care.  We will continue current treatment without escalation.  The family will come and see the patient in groups at 3-4 until always been in.  Suspect we will go to full comfort care after all the family has had time to interact with their loved one.  Currently is on multiple vasopressor support.  FiO2 100% with PO2 less than 50.  He is in ARDS multiorgan dysfunction no chance of survival. BP 90/60   Pulse (!) 125   Temp 98.8 F (37.1 C)   Resp (!) 30   Ht 6' (1.829 m)   Wt 127.7 kg   SpO2 100%   BMI 38.18 kg/m  Recent Labs  Lab 12/05/20 1708 12/11/2020 0314 12/11/2020 0754  NA 143 140 143  K 5.3* 5.2* 5.5*  CL 117* 115* 112*  CO2 18* 14* 13*  BUN 57* 58* 60*  CREATININE 1.10 1.54* 1.68*  GLUCOSE 145* 200* 258*   Recent Labs  Lab 12/05/20 0409 2020-12-11 0314 11-Dec-2020 0754  HGB 11.2* 11.5* 10.9*  HCT 38.4* 39.3 38.1*  WBC 5.0 0.5* 0.6*  PLT 196 205 197    Chaplin called to beside.   Richardson Landry Maquita Sandoval ACNP Acute Care Nurse Practitioner [place] Please consult [place] 2020/12/11, 12:25 PM

## 2021-01-03 NOTE — Progress Notes (Signed)
Pharmacy Antibiotic Note  Kamareon Sciandra is a 65 y.o. male admitted on 11/17/2020 with lethargy.  Pharmacy has been consulted for vancomycin & zosyn dosing for sepsis.  S/p CHOP for large B cell lymphoma 1/29 with rituxin 1/30 Tm 102.9, WBC 5, ANC 4.5 (on Granix) , SCr 1.12  December 24, 2020   +BCID = Acinetobacter. Pharmacy consulted to dose Unasyn and Tobramycin per protocol  Plan: D/C Zosyn D/C Vancomycin  - Unasyn 3gm IV q6h - Tobramycin 680mg  (7mg /kg adj weight) IV q24h - Check random Tobramycin level 10 hr after first dose given F/u WBC, temp, culture sensitivity data   Height: 6' (182.9 cm) Weight: 127.7 kg (281 lb 8.4 oz) IBW/kg (Calculated) : 77.6  Temp (24hrs), Avg:102.1 F (38.9 C), Min:99.3 F (37.4 C), Max:103.46 F (39.7 C)  Recent Labs  Lab 11/23/2020 1801 12/02/20 0220 12/03/20 0700 12/04/20 0500 12/05/20 0409 12/05/20 0442 12/05/20 0747 12/05/20 1708 12/05/20 2218  WBC 7.2 5.8 5.9 4.3 5.0  --   --   --   --   CREATININE 1.91* 1.69* 1.32* 0.95 1.12  --   --  1.10  --   LATICACIDVEN  --   --   --   --   --  1.8 1.9 1.5 2.1*    Estimated Creatinine Clearance: 93.7 mL/min (by C-G formula based on SCr of 1.1 mg/dL).    Allergies  Allergen Reactions  . Sulfa Antibiotics Rash    Antimicrobials this admission:  1/31 vanc> 1/31 zosyn>> Dose adjustments this admission:   Microbiology results:  1/28 Hep A/B/C NR 1/31 MRSA neg 1/27 & 1/27 Covid neg 1/31 BCx2: +Acinetobacter  Thank you for allowing pharmacy to be a part of this patient's care.  Leone Haven, PharmD 12-24-20 3:15 AM

## 2021-01-03 NOTE — Progress Notes (Signed)
eLink Physician-Brief Progress Note Patient Name: Andrew Dominguez DOB: May 06, 1956 MRN: [id number]   Date of Service  12/09/20  HPI/Events of Note  ABG on 100%/PRVC 18/TV 600/P 5 = 7.051/52.5/135.  eICU Interventions  Plan: 1. NaHCO3 200 meq IV now.  2. Resume NaHCO3 IV infusion when able. 3. Increase PRVC rate to 35. 4. Repeat ABG at 6:30 AM.     Intervention Category Major Interventions: Acid-Base disturbance - evaluation and management;Respiratory failure - evaluation and management  Tejuan Gholson [place] 2020/12/09, 5:20 AM

## 2021-01-03 NOTE — Progress Notes (Signed)
Patient time of death 1440- Pronounced by this RN and Justice Britain RN with family at bedside.

## 2021-01-03 NOTE — Progress Notes (Signed)
PT Cancellation Note  Patient Details Name: Andrew Dominguez MRN: [id number] DOB: Dec 16, 1955   Cancelled Treatment:    Reason Eval/Treat Not Completed: Medical issues which prohibited therapy (pt is intubated and critically ill, will hold PT today and follow.)   Philomena Doheny PT December 09, 2020  Acute Rehabilitation Services Pager [phone] Office [phone]

## 2021-01-03 NOTE — Anesthesia Preprocedure Evaluation (Signed)
Anesthesia Evaluation  Patient identified by MRN, date of birth, ID bandPreop documentation limited or incomplete due to emergent nature of procedure.  Airway        Dental   Pulmonary former smoker,           Cardiovascular hypertension,      Neuro/Psych    GI/Hepatic   Endo/Other  diabetes  Renal/GU      Musculoskeletal   Abdominal   Peds  Hematology   Anesthesia Other Findings   Reproductive/Obstetrics                             Anesthesia Physical Anesthesia Plan Anesthesia Quick Evaluation

## 2021-01-03 NOTE — Progress Notes (Signed)
eLink Physician-Brief Progress Note Patient Name: Andrew Dominguez DOB: 1956-03-14 MRN: [id number]   Date of Service  2020-12-18  HPI/Events of Note  WBC count = 0.5 this AM.  eICU Interventions  Plan: 1. Granix 480 mg [place] X 1 now.  2. Further Granix dosing per Oncology.     Intervention Category Major Interventions: Other:  Delona Clasby Cornelia Copa Dec 18, 2020, 5:03 AM

## 2021-01-03 NOTE — Progress Notes (Signed)
Patient BP drop into 50s post sedation/paralytic push. RN notified Richardson Landry Minor NP and at bedside to assess. RN initiated Epinephrine at 20 mcg and to titrate down as able. RN to titrate phenylephrine off as able

## 2021-01-03 NOTE — Progress Notes (Addendum)
eLink Physician-Brief Progress Note Patient Name: Andrew Dominguez DOB: January 02, 1956 MRN: [id number]   Date of Service  01-01-2021  HPI/Events of Note  Patient more lethargic than earlier in evening. GCS = 8-9. Saat = 89-90%, however, he desaturates to 86% on occasion. BP = 79/48 with MAP = 56. On Norepinephrine and Phenylephrine IV infusions. He remains in AFIB with RVR,  He is extremely marginal and will require elective intubation and mechanical ventilation. Mg++ = 2.0.  eICU Interventions  Plan: 1. Nursing to arrange to have anesthesia intubate the patient.  2. Ventilator settings: 100%/PRVC 18/TV 600/P 5. 3. ABG 1 hour post intubation. 4. Portable CXR post intubation. 5. Fentanyl IV infusion for sedation if BP tolerates. 6. Amiodarone 150 mg IV over 10 minutes now.      Intervention Category Major Interventions: Respiratory failure - evaluation and management;Hypoxemia - evaluation and management  Lysle Dingwall Jan 01, 2021, 3:36 AM

## 2021-01-03 NOTE — Progress Notes (Signed)
Pt was terminally extubated per D. Clark to 2l [place] at 1425. The family was at bedside.

## 2021-01-03 NOTE — Progress Notes (Signed)
Remainder of retained left hip sutures were successfully removed.  Minimal bleeding that quickly stopped with light pressure with gauze. No persistent bleeding/drainage noted.

## 2021-01-03 NOTE — Progress Notes (Addendum)
eLink Physician-Brief Progress Note Patient Name: Andrew Dominguez DOB: 03-10-1956 MRN: [id number]   Date of Service  2020-12-16  HPI/Events of Note  ABG on 100%/PRVC 35/TV 600/P 5 = 7.181/41.5/48.5 with sat = 73%.   eICU Interventions  Plan: 1. NaHCO3 100 meq IV now. 2. Increase NaHCO3 IV infusion to 125 mL/hour.  3. Increase PEEP to 10.  4. Repeat ABG at 7:45 AM.      Intervention Category Major Interventions: Acid-Base disturbance - evaluation and management;Respiratory failure - evaluation and management   Paone Cornelia Copa 16-Dec-2020, 6:41 AM

## 2021-01-03 NOTE — Progress Notes (Addendum)
Andrew Dominguez   DOB:August 20, 1956   YJ#:[id number]   YOV#:[id number]  Subjective:   Events overnight noted.  Now intubated and on 3 pressors.  Family has just arrived to meet with the critical care team to discuss goals of care.  Was febrile overnight.  Blood cultures positive with gram-negative rods in the aerobic bottle only.  Consistent with A.calcoaceticus-baumannii.  Sensitivities pending.  Left hip/leg noted to be erythematous which is enlarging in area.  Objective:  Vitals:   January 04, 2021 0930 01/04/2021 0947  BP:    Pulse:    Resp: (!) 30   Temp: 98.8 F (37.1 C)   SpO2:  100%    Body mass index is 38.18 kg/m.  Intake/Output Summary (Last 24 hours) at Jan 04, 2021 1213 Last data filed at 01/04/2021 0925 Gross per 24 hour  Intake 7990.45 ml  Output 250 ml  Net 7740.45 ml    Intubated. Anterior breath sounds diminished. Abdomen: Hypoactive bowel sounds. Bilateral lower extremities with 2+ edema, left leg cool to touch, unable to palpate pulse on the left. Cellulitis noted to left hip.  See photo below.      CBG (last 3)  Recent Labs    12/05/20 1638 12/05/20 2132 January 04, 2021 0754  GLUCAP 119* 121* 199*    Labs:  Lab Results  Component Value Date   WBC 0.6 (LL) 2021-01-04   HGB 10.9 (L) 01-04-2021   HCT 38.1 (L) 04-Jan-2021   MCV 82.8 01-04-21   PLT 197 04-Jan-2021   NEUTROABS 4.5 12/05/2020    Urine Studies No results for input(s): UHGB, CRYS in the last 72 hours.  Invalid input(s): UACOL, UAPR, USPG, UPH, UTP, UGL, UKET, UBIL, UNIT, UROB, ULEU, UEPI, UWBC, URBC, UBAC, CAST, [place], [place]  Basic Metabolic Panel: Recent Labs  Lab 11/22/2020 1801 12/02/20 0220 12/03/20 0700 12/04/20 0500 12/05/20 0409 12/05/20 1708 January 04, 2021 0008 2021-01-04 0314 2021/01/04 0754  NA 139   < > 145 144 141 143  --  140 143  K 4.5   < > 4.0 3.0* 4.4 5.3*  --  5.2* 5.5*  CL 104   < > 111 116* 112* 117*  --  115* 112*  CO2 22   < > 22 21* 20* 18*  --  14* 13*  GLUCOSE 93   < > 123*  117* 141* 145*  --  200* 258*  BUN 59*   < > 48* 41* 45* 57*  --  58* 60*  CREATININE 1.91*   < > 1.32* 0.95 1.12 1.10  --  1.54* 1.68*  CALCIUM 14.7*   < > 12.3* 8.8* 9.1 8.8*  --  8.1* 7.4*  MG  --   --  1.7 1.3* 1.6*  --  2.0 2.0  --   PHOS 3.7  --  3.2 2.6 2.9  --   --  5.4*  --    < > = values in this interval not displayed.   GFR Estimated Creatinine Clearance: 61.3 mL/min (A) (by C-G formula based on SCr of 1.68 mg/dL (H)). Liver Function Tests: Recent Labs  Lab 12/02/20 0220 12/03/20 0700 12/04/20 0500 12/05/20 0409 January 04, 2021 0314  AST 16 16 12* 21 24  ALT 14 15 10 16 11   ALKPHOS 70 59 42 50 49  BILITOT 0.9 0.8 0.4 0.4 0.8  PROT 5.6* 5.5* 4.1* 4.9* 4.6*  ALBUMIN 2.9* 3.0* 2.1* 2.4* 2.1*   No results for input(s): LIPASE, AMYLASE in the last 168 hours. No results for input(s): AMMONIA in the last  168 hours. Coagulation profile Recent Labs  Lab 12/05/20 0409  INR 1.3*    CBC: Recent Labs  Lab 12/03/20 0700 12/04/20 0500 12/05/20 0409 2020-12-18 0314 December 18, 2020 0754  WBC 5.9 4.3 5.0 0.5* 0.6*  NEUTROABS 4.6 3.5 4.5  --   --   HGB 11.5* 8.7* 11.2* 11.5* 10.9*  HCT 39.2 30.2* 38.4* 39.3 38.1*  MCV 81.5 84.1 81.2 82.0 82.8  PLT 286 172 196 205 197   Cardiac Enzymes: Recent Labs  Lab 12/18/20 0754  CKTOTAL 32*   BNP: Invalid input(s): POCBNP CBG: Recent Labs  Lab 12/05/20 0756 12/05/20 1154 12/05/20 1638 12/05/20 2132 12-18-20 0754  GLUCAP 122* 96 119* 121* 199*   D-Dimer No results for input(s): DDIMER in the last 72 hours. Hgb A1c No results for input(s): HGBA1C in the last 72 hours. Lipid Profile No results for input(s): CHOL, HDL, LDLCALC, TRIG, CHOLHDL, LDLDIRECT in the last 72 hours. Thyroid function studies No results for input(s): TSH, T4TOTAL, T3FREE, THYROIDAB in the last 72 hours.  Invalid input(s): FREET3 Anemia work up No results for input(s): VITAMINB12, FOLATE, FERRITIN, TIBC, IRON, RETICCTPCT in the last 72  hours. Microbiology Recent Results (from the past 240 hour(s))  SARS CORONAVIRUS 2 (TAT 6-24 HRS) Nasopharyngeal Nasopharyngeal Swab     Status: None   Collection Time: 12/05/2020  6:01 PM   Specimen: Nasopharyngeal Swab  Result Value Ref Range Status   SARS Coronavirus 2 NEGATIVE NEGATIVE Final    Comment: (NOTE) SARS-CoV-2 target nucleic acids are NOT DETECTED.  The SARS-CoV-2 RNA is generally detectable in upper and lower respiratory specimens during the acute phase of infection. Negative results do not preclude SARS-CoV-2 infection, do not rule out co-infections with other pathogens, and should not be used as the sole basis for treatment or other patient management decisions. Negative results must be combined with clinical observations, patient history, and epidemiological information. The expected result is Negative.  Fact Sheet for Patients: SugarRoll.be  Fact Sheet for Healthcare Providers: https://www.woods-mathews.com/  This test is not yet approved or cleared by the [place] FDA and  has been authorized for detection and/or diagnosis of SARS-CoV-2 by FDA under an Emergency Use Authorization (EUA). This EUA will remain  in effect (meaning this test can be used) for the duration of the COVID-19 declaration under Se ction 564(b)(1) of the Act, 21 U.S.C. section 360bbb-3(b)(1), unless the authorization is terminated or revoked sooner.  Performed at [facility], [place] [address], [place], [place] [address]   SARS Coronavirus 2 by RT PCR (hospital order, performed in [facility] hospital lab) Nasopharyngeal Nasopharyngeal Swab     Status: None   Collection Time: 12/02/20  1:49 AM   Specimen: Nasopharyngeal Swab  Result Value Ref Range Status   SARS Coronavirus 2 NEGATIVE NEGATIVE Final    Comment: (NOTE) SARS-CoV-2 target nucleic acids are NOT DETECTED.  The SARS-CoV-2 RNA is generally detectable in upper and  lower respiratory specimens during the acute phase of infection. The lowest concentration of SARS-CoV-2 viral copies this assay can detect is 250 copies / mL. A negative result does not preclude SARS-CoV-2 infection and should not be used as the sole basis for treatment or other patient management decisions.  A negative result may occur with improper specimen collection / handling, submission of specimen other than nasopharyngeal swab, presence of viral mutation(s) within the areas targeted by this assay, and inadequate number of viral copies (<250 copies / mL). A negative result must be combined with  clinical observations, patient history, and epidemiological information.  Fact Sheet for Patients:   StrictlyIdeas.no  Fact Sheet for Healthcare Providers: BankingDealers.co.za  This test is not yet approved or  cleared by the [place] FDA and has been authorized for detection and/or diagnosis of SARS-CoV-2 by FDA under an Emergency Use Authorization (EUA).  This EUA will remain in effect (meaning this test can be used) for the duration of the COVID-19 declaration under Section 564(b)(1) of the Act, 21 U.S.C. section 360bbb-3(b)(1), unless the authorization is terminated or revoked sooner.  Performed at [facility], [place] [address], [place], [place] [address]   MRSA PCR Screening     Status: None   Collection Time: 12/05/20  5:15 AM   Specimen: Nasopharyngeal  Result Value Ref Range Status   MRSA by PCR NEGATIVE NEGATIVE Final    Comment:        The GeneXpert MRSA Assay (FDA approved for NASAL specimens only), is one component of a comprehensive MRSA colonization surveillance program. It is not intended to diagnose MRSA infection nor to guide or monitor treatment for MRSA infections. Performed at [facility], [place] [address] [place], [place] [address]   Culture, blood (routine x 2)      Status: None (Preliminary result)   Collection Time: 12/05/20  9:53 AM   Specimen: BLOOD  Result Value Ref Range Status   Specimen Description   Final    BLOOD LEFT ANTECUBITAL Performed at [place] [address] [place], [place] [address]    Special Requests   Final    BOTTLES DRAWN AEROBIC AND ANAEROBIC Blood Culture adequate volume Performed at [place] [address], [place], [place] [address]    Culture  Setup Time   Final    GRAM NEGATIVE RODS AEROBIC BOTTLE ONLY CRITICAL RESULT CALLED TO, READ BACK BY AND VERIFIED WITH: L. POINDEXTER,PHARMD 0207 2020/12/15 Mena Goes Performed at [facility], [place] [address], [place], [place] [address]    Culture GRAM NEGATIVE RODS  Final   Report Status PENDING  Incomplete  Culture, blood (routine x 2)     Status: None (Preliminary result)   Collection Time: 12/05/20 10:13 AM   Specimen: BLOOD LEFT HAND  Result Value Ref Range Status   Specimen Description   Final    BLOOD LEFT HAND Performed at [place] [address], [place], [place] [address]    Special Requests   Final    BOTTLES DRAWN AEROBIC ONLY Blood Culture adequate volume Performed at [place] [address] [place], [place] [address]    Culture  Setup Time   Final    GRAM NEGATIVE RODS AEROBIC BOTTLE ONLY CRITICAL RESULT CALLED TO, READ BACK BY AND VERIFIED WITH: L. POINDEXTER,PHARMD 0207 15-Dec-2020 Mena Goes Performed at [facility], [place] [address], [place], [place] [address]    Culture PENDING  Incomplete   Report Status PENDING  Incomplete  Blood Culture ID Panel (Reflexed)     Status: Abnormal   Collection Time: 12/05/20 10:13 AM  Result Value Ref Range Status   Enterococcus faecalis NOT DETECTED NOT DETECTED Final   Enterococcus Faecium NOT DETECTED NOT DETECTED Final   Listeria monocytogenes NOT DETECTED NOT DETECTED Final   Staphylococcus species NOT  DETECTED NOT DETECTED Final   Staphylococcus aureus (BCID) NOT DETECTED NOT DETECTED Final   Staphylococcus epidermidis NOT DETECTED NOT DETECTED Final   Staphylococcus lugdunensis  NOT DETECTED NOT DETECTED Final   Streptococcus species NOT DETECTED NOT DETECTED Final   Streptococcus agalactiae NOT DETECTED NOT DETECTED Final   Streptococcus pneumoniae NOT DETECTED NOT DETECTED Final   Streptococcus pyogenes NOT DETECTED NOT DETECTED Final   A.calcoaceticus-baumannii DETECTED (A) NOT DETECTED Final    Comment: CRITICAL RESULT CALLED TO, READ BACK BY AND VERIFIED WITH: L. POINDEXTER,PHARMD 0207 2020/12/10 T. TYSOR    Bacteroides fragilis NOT DETECTED NOT DETECTED Final   Enterobacterales NOT DETECTED NOT DETECTED Final   Enterobacter cloacae complex NOT DETECTED NOT DETECTED Final   Escherichia coli NOT DETECTED NOT DETECTED Final   Klebsiella aerogenes NOT DETECTED NOT DETECTED Final   Klebsiella oxytoca NOT DETECTED NOT DETECTED Final   Klebsiella pneumoniae NOT DETECTED NOT DETECTED Final   Proteus species NOT DETECTED NOT DETECTED Final   Salmonella species NOT DETECTED NOT DETECTED Final   Serratia marcescens NOT DETECTED NOT DETECTED Final   Haemophilus influenzae NOT DETECTED NOT DETECTED Final   Neisseria meningitidis NOT DETECTED NOT DETECTED Final   Pseudomonas aeruginosa NOT DETECTED NOT DETECTED Final   Stenotrophomonas maltophilia NOT DETECTED NOT DETECTED Final   Candida albicans NOT DETECTED NOT DETECTED Final   Candida auris NOT DETECTED NOT DETECTED Final   Candida glabrata NOT DETECTED NOT DETECTED Final   Candida krusei NOT DETECTED NOT DETECTED Final   Candida parapsilosis NOT DETECTED NOT DETECTED Final   Candida tropicalis NOT DETECTED NOT DETECTED Final   Cryptococcus neoformans/gattii NOT DETECTED NOT DETECTED Final   CTX-M ESBL NOT DETECTED NOT DETECTED Final   Carbapenem resistance IMP NOT DETECTED NOT DETECTED Final   Carbapenem resistance KPC NOT DETECTED  NOT DETECTED Final   Carbapenem resistance NDM NOT DETECTED NOT DETECTED Final   Carbapenem resistance VIM NOT DETECTED NOT DETECTED Final    Comment: Performed at [facility], [place] [address], [place], [place] [address]     Studies:  DG Abd 1 View  Result Date: 12/05/2020 CLINICAL DATA:  NG tube position EXAM: ABDOMEN - 1 VIEW COMPARISON:  12/05/2020 FINDINGS: Transesophageal tube has been advanced with the tip and side port distal to the GE junction, terminating near the gastric body. Bibasilar atelectatic changes are present with likely small right pleural effusion. No high-grade obstructive bowel gas pattern in the included portions of the abdomen. No acute osseous abnormalities. IMPRESSION: 1. Transesophageal tube has been advanced with the tip and side port distal to the GE junction, terminating near the gastric body. 2. Bibasilar atelectasis with likely small right pleural effusion. Electronically Signed   By: Lovena Le M.D.   On: 12/05/2020 21:36   DG Abd 1 View  Result Date: 12/05/2020 CLINICAL DATA:  65 year old male status post NG placement. EXAM: ABDOMEN - 1 VIEW COMPARISON:  None. FINDINGS: Partially visualized NG tube with tip and side-port just distal to the GE junction. Recommend further advancing of the tube by additional 7 cm. Minimal bibasilar atelectasis. IMPRESSION: Partially visualized NG tube with tip and side-port just distal to the GE junction. Recommend further advancing of the tube. Electronically Signed   By: Anner Crete M.D.   On: 12/05/2020 20:37   CT HEAD WO CONTRAST  Result Date: 12/05/2020 CLINICAL DATA:  Worsening lethargy and diplopia. Recent hip fracture. EXAM: CT HEAD WITHOUT CONTRAST TECHNIQUE: Contiguous axial images were obtained from the base of the skull through the vertex without intravenous contrast. COMPARISON:  None. FINDINGS: Brain: Mild age related volume loss. No evidence of old or acute focal  infarction, mass lesion, hemorrhage,  hydrocephalus or extra-axial collection. Vascular: No abnormal vascular finding. Skull: Normal Sinuses/Orbits: Clear/normal.  Previous lens implant on the right. Other: None IMPRESSION: Normal head CT for age. Mild age related volume loss. Electronically Signed   By: Nelson Chimes M.D.   On: 12/05/2020 14:12   CT PELVIS WO CONTRAST  Result Date: 12/05/2020 CLINICAL DATA:  Pelvic lymphadenopathy, B-cell lymphoma, scrotal swelling, left hip swelling EXAM: CT PELVIS WITHOUT CONTRAST TECHNIQUE: Multidetector CT imaging of the pelvis was performed following the standard protocol without intravenous contrast. COMPARISON:  CT abdomen pelvis 12/02/2020 FINDINGS: Urinary Tract: The bladder is decompressed with a Foley catheter balloon seen within its lumen. Bowel: Marked sigmoid diverticulosis. Mild free intraperitoneal fluid again noted, slightly progressive since prior examination. Vascular/Lymphatic: Moderate aortoiliac atherosclerotic calcification. No aortic aneurysm. No pathologic lymphadenopathy within the pelvis. Bilateral pathologic inguinal adenopathy is again seen and is stable. Reproductive: The prostate gland and seminal vesicles are unremarkable. Other: Perineural/perivascular soft tissue coursing along the expected location of the superior gluteal artery and nerve through the greater sciatic notch is again noted in keeping with perineural spread of disease. Musculoskeletal: Large lytic lesion eroding the left iliac spine is again seen and is unchanged from prior examination measuring at least 6.8 x 11.4 cm at axial image # 34/9. Additional metastatic nodule identified within the left gluteal musculature. Large lytic lesion erodes the intratrochanteric region of the left femur, similar to that noted on prior examination, measuring at least 9.5 x 11.1 cm at axial image # 121/9. Multiple additional soft tissue nodules are seen within the rectus femoris and vastus lateralis musculature. 2.6 cm intramedullary  lytic lesion within the a intratrochanteric region of the right femur is again noted with cortical erosion posteriorly comprising approximately 25% of the circumference of the femur. There is asymmetric edema and soft tissue swelling involving the proximal left thigh and hip. Mild right proximal thigh edema. Moderate bilateral flank subcutaneous edema, slightly progressive since prior examination. There is scrotal wall edema noted. No discrete fluid collection is identified within these regions, however. IMPRESSION: No discrete fluid collection identified within the soft tissues of the pelvis. Asymmetric edema of the proximal left thigh again identified. Multiple soft tissue and lytic osseous metastatic foci again identified, unchanged from prior examination. Electronically Signed   By: Fidela Salisbury MD   On: 12/05/2020 14:19   DG CHEST PORT 1 VIEW  Result Date: 12-30-2020 CLINICAL DATA:  Intubation EXAM: PORTABLE CHEST 1 VIEW COMPARISON:  Yesterday FINDINGS: Endotracheal tube with tip at the clavicular heads. The enteric tube at least reaches the diaphragm. Right PICC with tip at the lower SVC. Hazy bilateral chest opacification. No suspected cardiomegaly. Mediastinal contours are distorted by rotation. IMPRESSION: 1. Unremarkable hardware. 2. Worsening chest opacification which is a combination of pleural fluid, atelectasis, and nodules based on recent chest CT. Electronically Signed   By: Monte Fantasia M.D.   On: December 30, 2020 04:38   DG Chest Port 1 View  Result Date: 12/05/2020 CLINICAL DATA:  Acute respiratory distress EXAM: PORTABLE CHEST 1 VIEW COMPARISON:  Three days ago FINDINGS: Hazy right chest opacity with pleural fluid, atelectasis, and pulmonary opacity by CT. Normal heart size and stable hilar contours. Right PICC with tip at the SVC. IMPRESSION: Unchanged airspace disease and pleural effusion asymmetric to the right. Electronically Signed   By: Monte Fantasia M.D.   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 -Started with R CHOP which is a universally acceptable treatment in large B cell lymphomas despite presence of absence of double/triple hit lymphomas. -C1D1 R_ CHOP 12/03/2020, rituximab 12/04/2020. -On Sunday night he started spiking fevers and has been clinically deteriorating with fevers, hypotension need for pressors. Given decline in condition, ongoing need for multiple pressors with poor response, family has agreed to comfort care Family visiting when I tried to see him. Agree this is appropriate for comfort care.   Mikey Bussing, NP December 28, 2020  12:13 PM

## 2021-01-03 NOTE — Plan of Care (Signed)
Decompensated overnight. Trouble finding pulses on left leg with doppler. On exam line of erythema & warmth extending up to abdomen on the left and wrapping medially down thigh, both significantly extended since yesterday. Severe edema in that leg. On 3 vasopressors, still titrating up.   MV with air trapping, still dyssynchronous despite sedation. Will bolus sedation and push rocuronium x 1 dose. Duonebs Q4h added.  Calling orthopaedics on call to discuss plan regarding his leg. I am concerned this is an uncontrolled source of sepsis.   Julian Hy, DO 01/05/2021 7:51 AM [facility]  From 7AM- 7PM if no response to pager, please call [phone]. After hours, 7PM- 7AM, please call Elink  [phone].

## 2021-01-03 NOTE — Consult Note (Signed)
NAME:  Andrew Dominguez, MRN:  [id number], DOB:  Dec 12, 1955, LOS: 5 ADMISSION DATE:  11/27/2020, CONSULTATION DATE:  1/31 REFERRING MD:  Pietro Cassis, CHIEF COMPLAINT:  Hypotension    Brief History:  65 year old male w/ B-cell Lymphoma (yet to undergo therapy) admitted w/ hypercalcemia and encephalopathy 1/27.  -received CHOP 1/29 -received Rituximab 1/30 - fever, AF w/ RVR and hypotension 1/31 w/ worsening MS (had been improving). PCCM consulted.   History of Present Illness:  65 year old male w/ hx as below. Residing as SNF since Nov 2021 after pathologic hip fracture w/ plan to eventually undergo therapy for metastatic large B cell Lymphoma.  Presented to the ED at [facility] 1/27 w/ cc: confusion/AMS worsening weakness and LE edema.  -In ER Ca >15 Treated w/ IV fluids and diuretics and calcitonin. Got Zometa on 1/28. Onc consulted.  IR was consulted to get confirmatory bx of liver lesion.  -MS better on 1/29 -given first round of CHOP 1/29 -Rituximab given 1/30 1/31 new tachycardia w/ HR 120-130s->moved to SDU, new redness left high and hip. Fever 102.9. Vanc and zosyn started.  PCCM asked to see for hypotension.  15-Dec-2020 worse overnight requiring intubation and 3 pressors support proved refractory to interventions.  Changed to a limited CODE BLUE after discussion with significant other.  Past Medical History:   HTN, afib (on Eliquis), HLD, COPD w/ pulm nodules. DM type II w/ neuropathy,  liver mass, left kidney mass, splenic mass. Had hip fracture back Nov 2021 (was pathologic fx w/ Bx lesion + for B  cell Lymphoma).  Was being followed by ONC at [facility] but was still residing at [facility] and never made it to follow up.   Significant Hospital Events:   1/27 w/ cc: confusion/AMS worsening weakness and LE edema.  -In ER Ca >15 Treated w/ IV fluids and diuretics and calcitonin. Got Zometa on 1/28. Onc consulted.  IR was consulted to get confirmatory bx of liver lesion.  -MS better on 1/29 -given  first round of CHOP 1/29 -Rituximab given 1/30 1/31 new tachycardia w/ HR 120-130s->moved to SDU, new redness left high and hip. Fever 102.9. Vanc and zosyn started.  PCCM asked to see for hypotension.    Consults:  ONC 1/28 IR for liver bx 1/29 12/05/2020 orthopedics  Procedures:    Significant Diagnostic Tests:  CT of chest/abd/pelvis 1/28: 1. Overall significant progression of malignancy. There is enlargement of the widespread hepatic masses; new and enlarging sub solid pulmonary nodules; new and enlarging thoracic and abdominal adenopathy; enlarging lytic destructive lesions of the proximal femurs and left iliac bone; new mass in the left gluteus maximus muscle; and similar large mass in the spleen but with some new soft tissue nodules along the medial spleen. 2. Large right and small left pleural effusions with passive atelectasis.3. Other imaging findings of potential clinical significance: Coronary, aortic arch, and branch vessel atherosclerotic vascular disease. Cholelithiasis. Mild perihepatic and pelvic ascites. Subcutaneous edema along the flanks. Small left adrenal myelolipoma. Small amount of pelvic ascites. 4. Aortic atherosclerosis.  Micro Data:  BCX2 1/31>>> MRSA PCR 1/31>>> Antimicrobials:  vanc 1/31 Zosyn 1/31  Interim History / Subjective:  Acute decompensation the early morning hours of 12/15/2020.  Now on 3 vasopressor support.  Worsening ARDS.  Refractory to vasopressors.  Significant other called agreed to limited CODE BLUE no shock no CPR she and family are in route for further discussions concerning CODE STATUS.  Objective   Blood pressure 90/60, pulse Marland Kitchen)  125, temperature 99.86 F (37.7 C), resp. rate (!) 24, height 6' (1.829 m), weight 127.7 kg, SpO2 100 %. CVP:  [10 mmHg-11 mmHg] 11 mmHg  Vent Mode: PRVC FiO2 (%):  [100 %] 100 % Set Rate:  [18 bmp-35 bmp] 35 bmp Vt Set:  [600 mL] 600 mL PEEP:  [5 cmH20] 5 cmH20 Plateau Pressure:  [15 cmH20-19  cmH20] 15 cmH20   Intake/Output Summary (Last 24 hours) at 2020-12-24 0811 Last data filed at Dec 24, 2020 0156 Gross per 24 hour  Intake 5084.08 ml  Output 320 ml  Net 4764.08 ml   Filed Weights   11/14/2020 2225 12/02/20 0421 12/05/20 0500  Weight: 132.5 kg 132.5 kg 127.7 kg    Examination: General: Ill-appearing male who is heavily sedated on full mechanical ventilatory support HEENT: Endotracheal and gastric tube in place Neuro: Will follow some commands at times, currently heavily sedated CV: Heart sounds are distant atrial fibrillation with ventricular rate of 102. Currently on vasopressin, Levophed, Neo-Synephrine with marginal blood pressure PULM: Decreased breath sounds throughout, chest x-ray shows tube placement adequate with right pleural effusion Vent pressure regulated volume control FIO2 100% PEEP 10 RATE 35 VT 600 Noted to be air-trapping changed ARDS protocol increase sedation and neuromuscular blockade  GI: Decreased bowel sounds GU: Amber urine Extremities: Lower extremities are cool creased pulses both feet left greater than right.  Straight noted on left leg because advanced to the left hip.  Left hip is tense vascular changes. Skin:    Left hip  Resolved Hospital Problem list      Assessment & Plan:   Ventilator dependent respiratory failure in the setting of multiorgan dysfunction and early ARDS. Placed in ARDS protocol Increase sedation 1 dose of neuromuscular blockade   Circulatory shock. Meets SIRS criteria so sepsis a possibility. Not sure about source. ? Left hip cellulitis (has significant lytic involvement of the left hip/prox femur and left gluteal muscle)  . Alternatively consider simply volume depletion or relative adrenal insuff. (could be a mix of any or all) Plan Multiple vasopressors for which he is refractory to Aggressive IV fluids Antihypertensives have been discontinued Lactic acid is pending Discussed with family December 24, 2020 is now  a limited CODE BLUE no shock no CPR.   Cont IV hydration F/u cortisol Transduce CVP, gaol 8-12 No more lasix for now Dc CCB gtt Repeat LA Follow cultures and cont abx (day 1 vanc/zosyn) Will cont to watch left hip (may need to consider re-imaging) Need to discuss goals of care w/ family. Going to ask palliative to see.   afib w/ RVR Plan Multiple vasopressors Turned off heparin due to may require invasive  Acute Metabolic Encephalopathy- Plan Now heavily sedated secondary respiratory failure  Metastatic B Large B cell Lymphoma w/ hyperuricemia and hypercalcemia -widely metastatic involving:   enlarging sub solid pulmonary nodules; new and enlarging thoracic and abdominal adenopathy; enlarging lytic destructive lesions of the proximal femurs and left iliac bone; new mass in the left gluteus maximus muscle; and similar large mass in the spleen but with some new soft tissue nodules along the medial spleen. -Ca normalized -Uric acid normalized  Now s/p initial CHOP 1/29 and Rituximab 1/30 Plan Cont supportive care Medical oncology is following  H/o COPD Plan Bronchodilators  Right pleural effusion Plan Continue to monitor Currently on heparin drip   Double vision.  Has been subacute for several weeks Plan CT of the head was negative Point due to severity shock  AKI in the setting of  profound shock Lab Results  Component Value Date   CREATININE 1.54 (H) 26-Dec-2020   CREATININE 1.10 12/05/2020   CREATININE 1.12 12/05/2020    Plan Monitor creatinine electrolytes Avoid nephrotoxins  Fluid and electrolyte imbalance: hypomagnesemia, Hyperchloremia and NAGMA  Plan Fluids been changed to lactated Ringer's Monitor and replete electrolytes as needed  Anemia of chronic disease.  Recent Labs    12/26/2020 0314 Dec 26, 2020 0754  HGB 11.5* 10.9*    Plan Transfuse per protocol  DM type 2 w/ neuropathy CBG (last 3)  Recent Labs    12/05/20 1154 12/05/20 1638  12/05/20 2132  GLUCAP 96 119* 121*    Plan Sliding-scale insulin protocol   Best practice (evaluated daily)  Diet: NPO w/ sips of clears Pain/Anxiety/Delirium protocol (if indicated): Sedation for tube tolerance ventilatory support VAP protocol (if indicated): In place DVT prophylaxis: Heparin drip which has been turned off GI prophylaxis: NA Glucose control: SSI Mobility: BR Disposition: Intensive care  Goals of Care:  Last date of multidisciplinary goals of care discussion: pending Family and staff present: NA Summary of discussion: NA Follow up goals of care discussion due: December 26, 2020 significant other Mandy Avaron updated via phone.  She agrees with no shock or CPR.  She or family are in the room for further discussions if possible, care. Code Status: Limited CODE BLUE no shock no CPR  Labs   CBC: Recent Labs  Lab 12/03/20 0700 12/04/20 0500 12/05/20 0409 2020/12/26 0314 12/26/2020 0754  WBC 5.9 4.3 5.0 0.5* PENDING  NEUTROABS 4.6 3.5 4.5  --   --   HGB 11.5* 8.7* 11.2* 11.5* 10.9*  HCT 39.2 30.2* 38.4* 39.3 38.1*  MCV 81.5 84.1 81.2 82.0 82.8  PLT 286 172 196 205 993    Basic Metabolic Panel: Recent Labs  Lab 11/06/2020 1801 12/02/20 0220 12/03/20 0700 12/04/20 0500 12/05/20 0409 12/05/20 1708 12-26-2020 0008 December 26, 2020 0314  NA 139   < > 145 144 141 143  --  140  K 4.5   < > 4.0 3.0* 4.4 5.3*  --  5.2*  CL 104   < > 111 116* 112* 117*  --  115*  CO2 22   < > 22 21* 20* 18*  --  14*  GLUCOSE 93   < > 123* 117* 141* 145*  --  200*  BUN 59*   < > 48* 41* 45* 57*  --  58*  CREATININE 1.91*   < > 1.32* 0.95 1.12 1.10  --  1.54*  CALCIUM 14.7*   < > 12.3* 8.8* 9.1 8.8*  --  8.1*  MG  --   --  1.7 1.3* 1.6*  --  2.0 2.0  PHOS 3.7  --  3.2 2.6 2.9  --   --  5.4*   < > = values in this interval not displayed.   GFR: Estimated Creatinine Clearance: 66.9 mL/min (A) (by C-G formula based on SCr of 1.54 mg/dL (H)). Recent Labs  Lab 12/04/20 0500 12/05/20 0409  12/05/20 0442 12/05/20 0747 12/05/20 1708 12/05/20 2218 Dec 26, 2020 0314 12-26-2020 0754  WBC 4.3 5.0  --   --   --   --  0.5* PENDING  LATICACIDVEN  --   --  1.8 1.9 1.5 2.1*  --   --     Liver Function Tests: Recent Labs  Lab 12/02/20 0220 12/03/20 0700 12/04/20 0500 12/05/20 0409 12-26-20 0314  AST 16 16 12* 21 24  ALT 14 15 10 16 11   ALKPHOS  70 59 42 50 49  BILITOT 0.9 0.8 0.4 0.4 0.8  PROT 5.6* 5.5* 4.1* 4.9* 4.6*  ALBUMIN 2.9* 3.0* 2.1* 2.4* 2.1*   No results for input(s): LIPASE, AMYLASE in the last 168 hours. No results for input(s): AMMONIA in the last 168 hours.  ABG    Component Value Date/Time   PHART 7.181 (LL) December 31, 2020 0623   PCO2ART 41.5 12-31-2020 0623   PO2ART 48.2 (L) 12/31/20 0623   HCO3 14.9 (L) 2020/12/31 0623   ACIDBASEDEF 12.6 (H) 12-31-2020 0623   O2SAT 73.6 12-31-2020 0623     Coagulation Profile: Recent Labs  Lab 12/05/20 0409  INR 1.3*    Cardiac Enzymes: No results for input(s): CKTOTAL, CKMB, CKMBINDEX, TROPONINI in the last 168 hours.  HbA1C: Hgb A1c MFr Bld  Date/Time Value Ref Range Status  12/02/2020 02:20 AM 5.0 4.8 - 5.6 % Final    Comment:    (NOTE) Pre diabetes:          5.7%-6.4%  Diabetes:              >6.4%  Glycemic control for   <7.0% adults with diabetes   09/25/2020 03:16 AM 5.4 4.8 - 5.6 % Final    Comment:    (NOTE) Pre diabetes:          5.7%-6.4%  Diabetes:              >6.4%  Glycemic control for   <7.0% adults with diabetes     CBG: Recent Labs  Lab 12/05/20 0441 12/05/20 0756 12/05/20 1154 12/05/20 1638 12/05/20 2132  GLUCAP 119* 122* 96 119* 121*     Critical care time:  74 min   Steve Loralie Malta ACNP Acute Care Nurse Practitioner [place] Please consult [place] 2020/12/31, 8:11 AM

## 2021-01-03 NOTE — Progress Notes (Addendum)
Chaplain paged by RNs.  Providing support at bedside with extended family in context of comfort care.     Chaplain provided grief support, narrative review, eulogizing, normalization and processing of feelings, resources.   Present with family at terminal extubation and pt death.

## 2021-01-03 NOTE — Progress Notes (Signed)
Pt is going to be comfort care

## 2021-01-03 NOTE — TOC Progression Note (Signed)
Transition of Care [facility]) - Progression Note    Patient Details  Name: Andrew Dominguez MRN: [id number] Date of Birth: 07/19/1956  Transition of Care [facility]) CM/SW Contact  Leeroy Cha, RN Phone Number: 13-Dec-2020, 8:10 AM  Clinical Narrative:    Decompensated overnight. Trouble finding pulses on left leg with doppler. On exam line of erythema & warmth extending up to abdomen on the left and wrapping medially down thigh, both significantly extended since yesterday. Severe edema in that leg. On 3 vasopressors, still titrating up.   MV with air trapping, still dyssynchronous despite sedation. Will bolus sedation and push rocuronium x 1 dose. Duonebs Q4h added.  Calling orthopaedics on call to discuss plan regarding his leg. Patient is now on Vent at 100% fi02. PLAN: to return to [place] once stable.  Expected Discharge Plan: [place] Barriers to Discharge: Continued Medical Work up  Expected Discharge Plan and Services Expected Discharge Plan: [place]   Discharge Planning Services: CM Consult Post Acute Care Choice: [place] Living arrangements for the past 2 months: [place]                                       Social Determinants of Health (SDOH) Interventions    Readmission Risk Interventions No flowsheet data found.

## 2021-01-03 NOTE — Progress Notes (Signed)
eLink Physician-Brief Progress Note Patient Name: Andrew Dominguez DOB: 01/10/1956 MRN: [id number]   Date of Service  December 30, 2020  HPI/Events of Note  Hypotension - BP = 90/51 with MAP = 63. Currently on Phenylephrine and Norepinephrine IV infusions.   eICU Interventions  Plan: 1. Add Vasopressin Iv infusion at shock dose.         Brigetta Beckstrom Cornelia Copa 2020/12/30, 5:48 AM

## 2021-01-03 DEATH — deceased

## 2021-01-11 ENCOUNTER — Ambulatory Visit: Payer: 59 | Admitting: Orthopedic Surgery

## 2021-05-15 IMAGING — DX DG CHEST 1V PORT
1 series · 1 of 1 positions shown · non-contrast
Comparison: 09/24/2020.  CT 09/01/2020.

CLINICAL DATA: Fever

EXAM:
PORTABLE CHEST 1 VIEW

[chest ap]
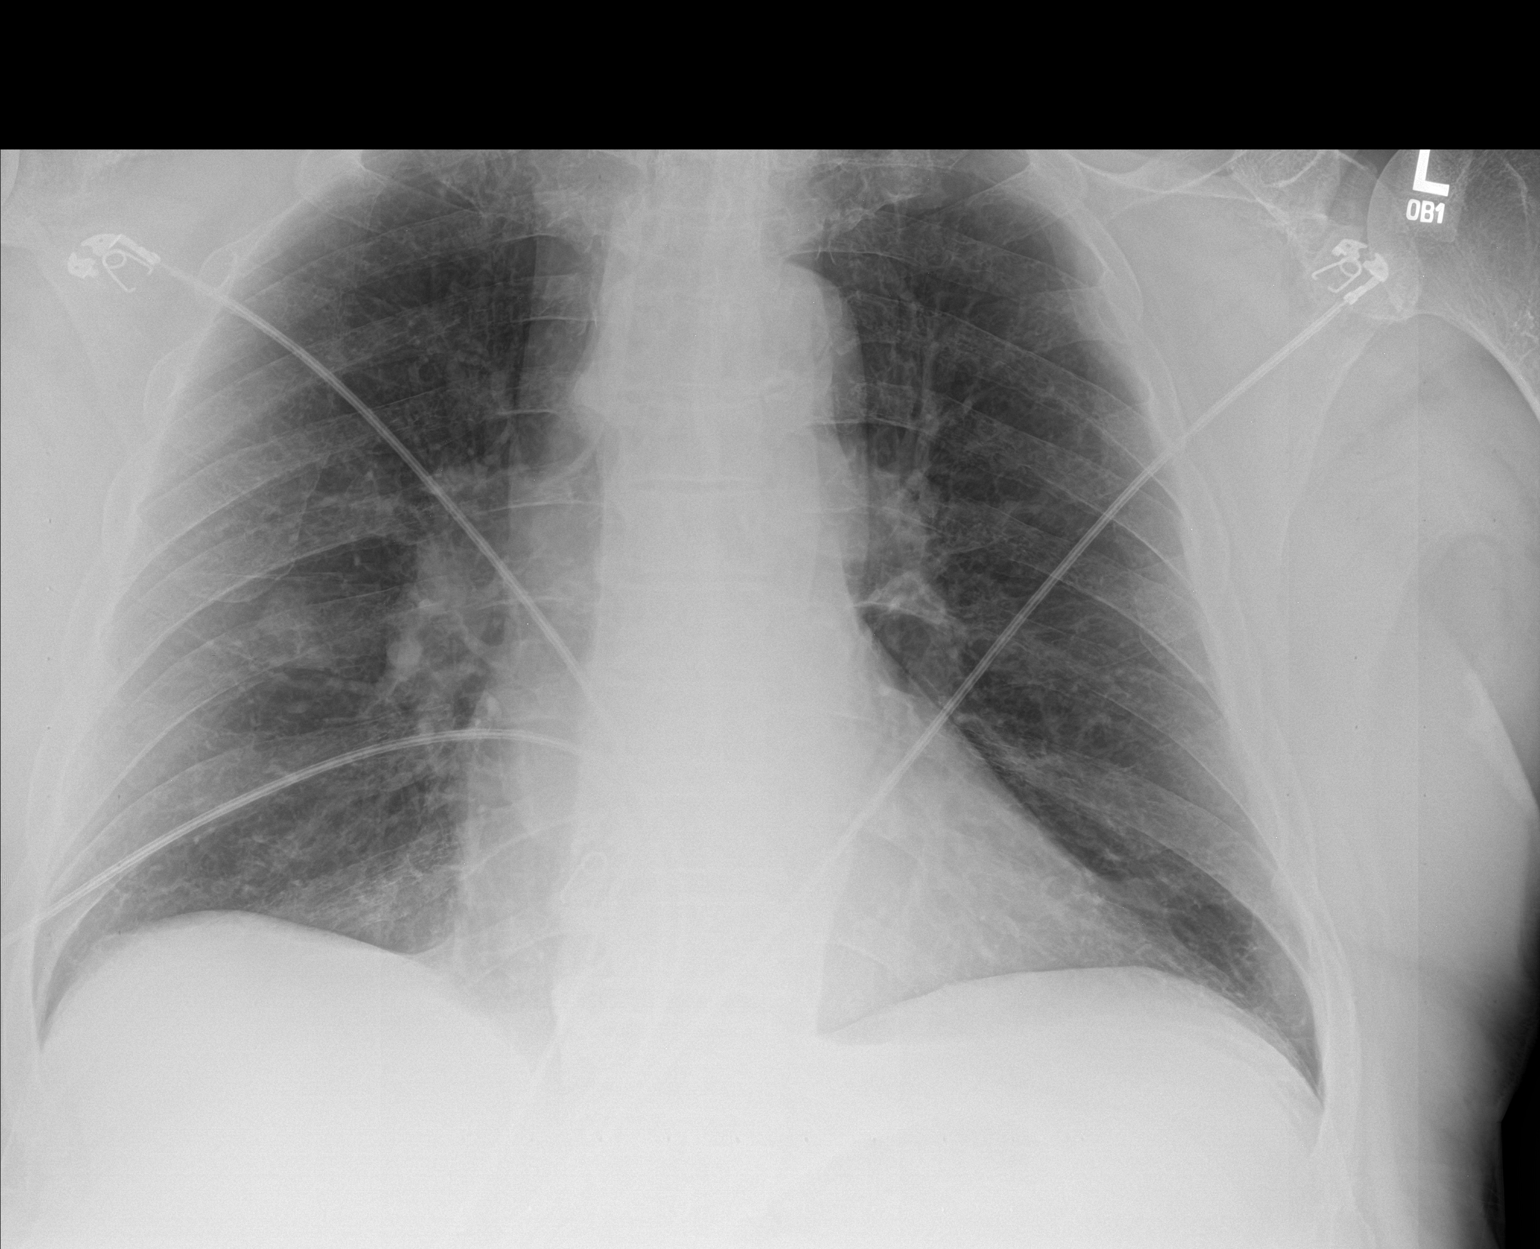

[1 of 1 positions shown; findings below may reference images not displayed]

FINDINGS: Heart size is normal. Mediastinal shadows are normal. 2-3 cm
indistinct density seen lateral to the right hilum could be an
infiltrate or mass. Most of the abnormalities shown on the recent
chest CT cannot be seen by radiography.
IMPRESSION: 2-3 cm indistinct density lateral to the right hilum could be an
infiltrate or mass. Most of the abnormalities shown on the recent
chest CT cannot be seen by radiography.

## 2021-07-23 IMAGING — DX DG CHEST 1V PORT
1 series · 1 of 1 positions shown · non-contrast
Comparison: Three days ago

CLINICAL DATA: Acute respiratory distress

EXAM:
PORTABLE CHEST 1 VIEW

[chest ap]
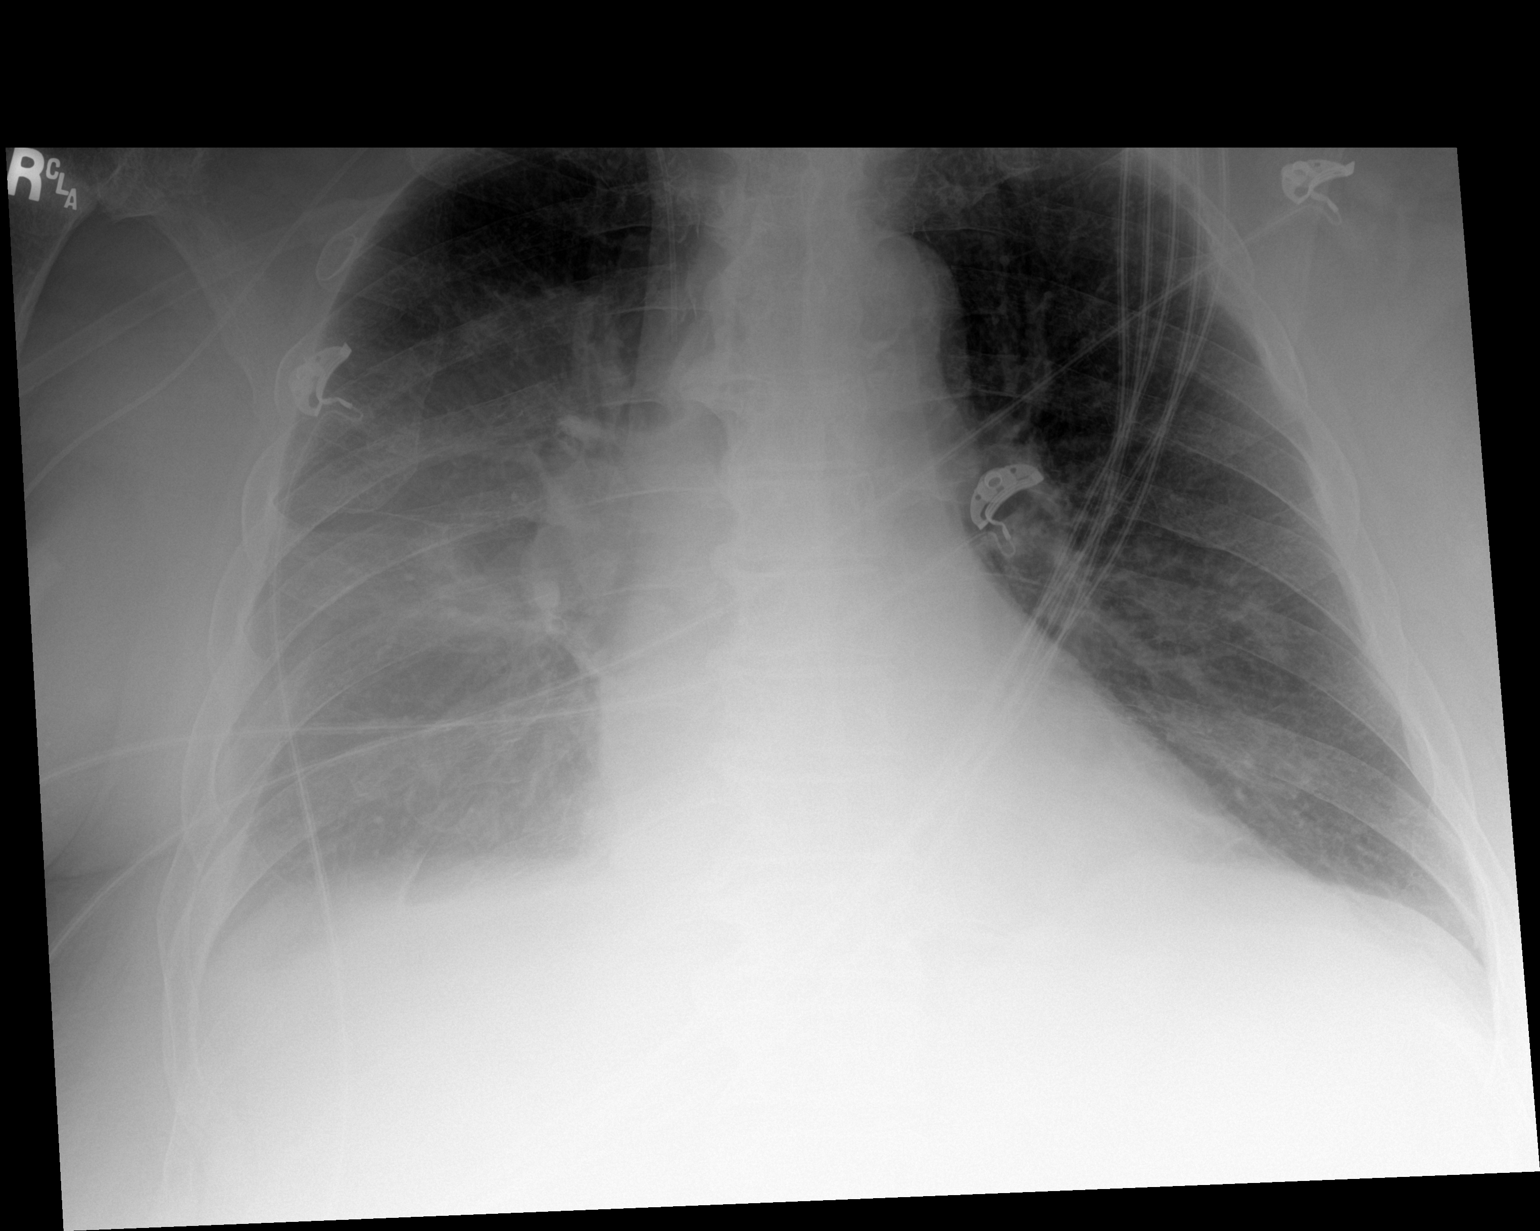

[1 of 1 positions shown; findings below may reference images not displayed]

FINDINGS: Hazy right chest opacity with pleural fluid, atelectasis, and
pulmonary opacity by CT.

Normal heart size and stable hilar contours.

Right PICC with tip at the SVC.
IMPRESSION: Unchanged airspace disease and pleural effusion asymmetric to the
right.

## 2021-07-23 IMAGING — DX DG ABDOMEN 1V
1 series · 1 of 1 positions shown · non-contrast
Comparison: 12/05/2020

CLINICAL DATA: NG tube position

EXAM:
ABDOMEN - 1 VIEW

[abdomen kub]
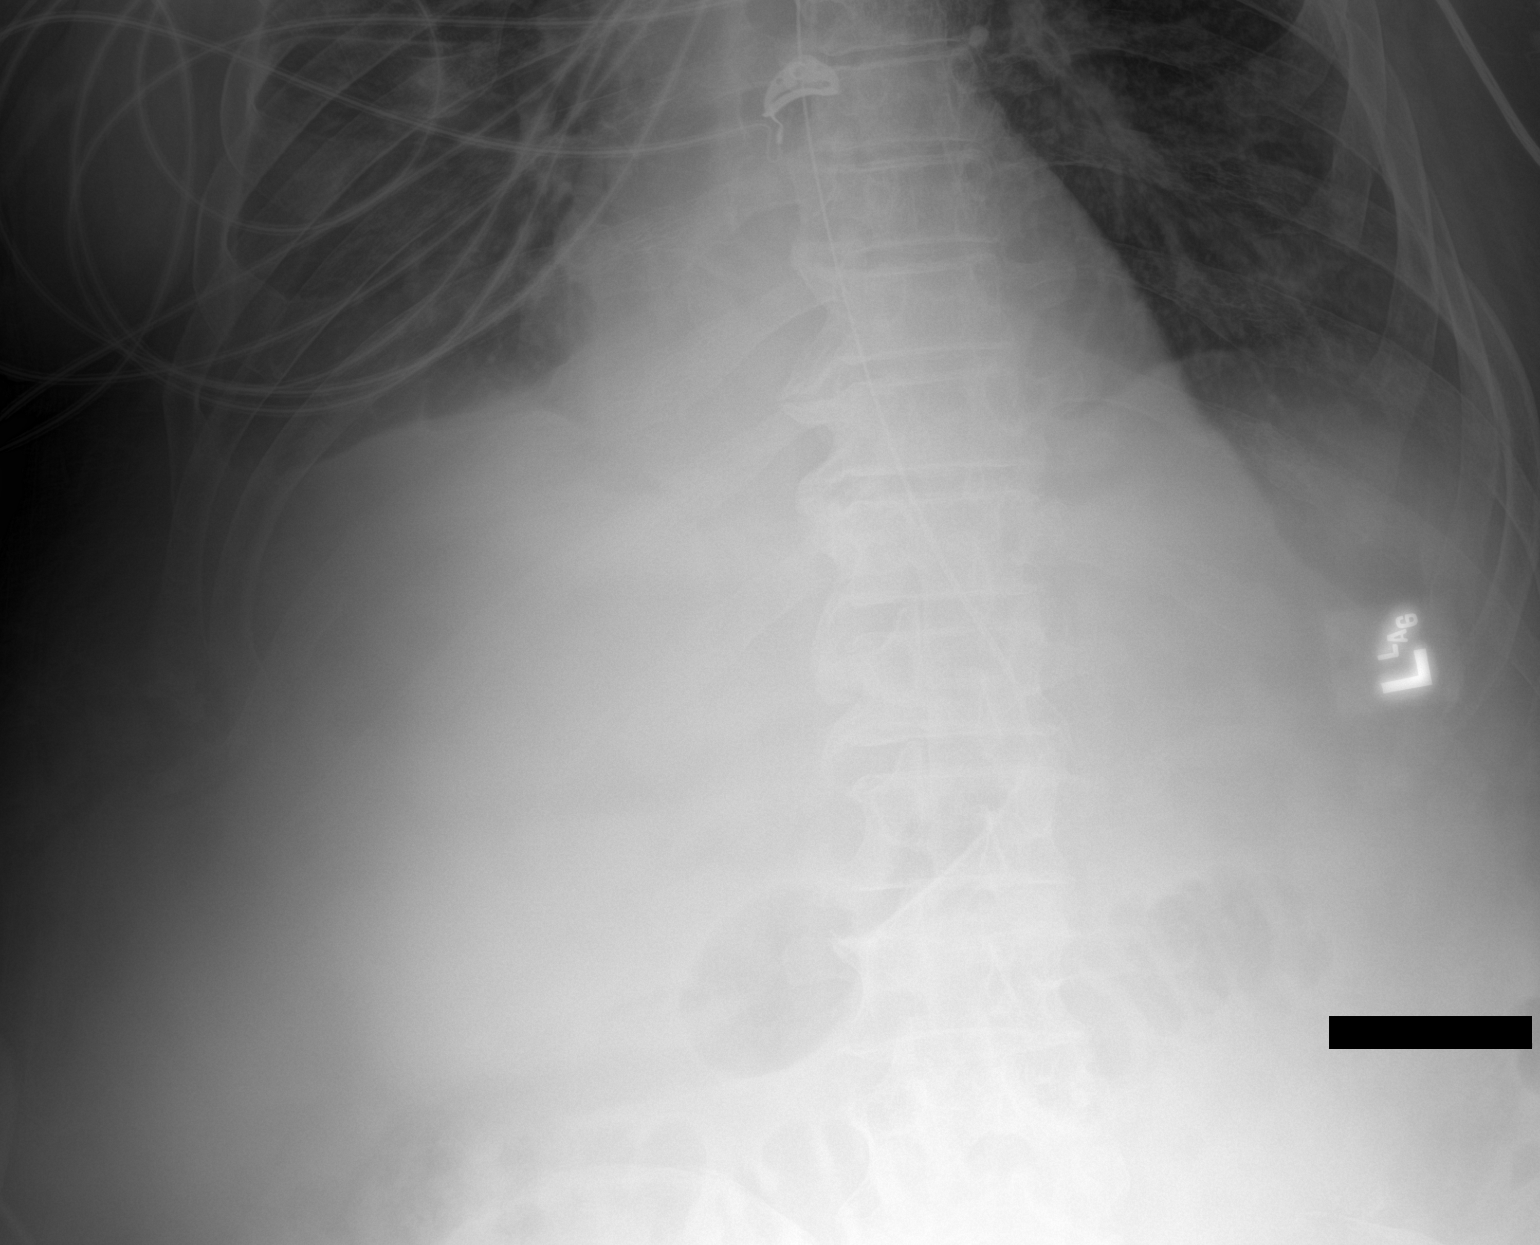

[1 of 1 positions shown; findings below may reference images not displayed]

FINDINGS: Transesophageal tube has been advanced with the tip and side port
distal to the GE junction, terminating near the gastric body.
Bibasilar atelectatic changes are present with likely small right
pleural effusion. No high-grade obstructive bowel gas pattern in the
included portions of the abdomen. No acute osseous abnormalities.
IMPRESSION: 1. Transesophageal tube has been advanced with the tip and side port
distal to the GE junction, terminating near the gastric body.
2. Bibasilar atelectasis with likely small right pleural effusion.

## 2021-07-23 IMAGING — DX DG ABDOMEN 1V
1 series · 1 of 1 positions shown · non-contrast
Comparison: None.

CLINICAL DATA: 64-year-old male status post NG placement.

EXAM:
ABDOMEN - 1 VIEW

[abdomen kub]
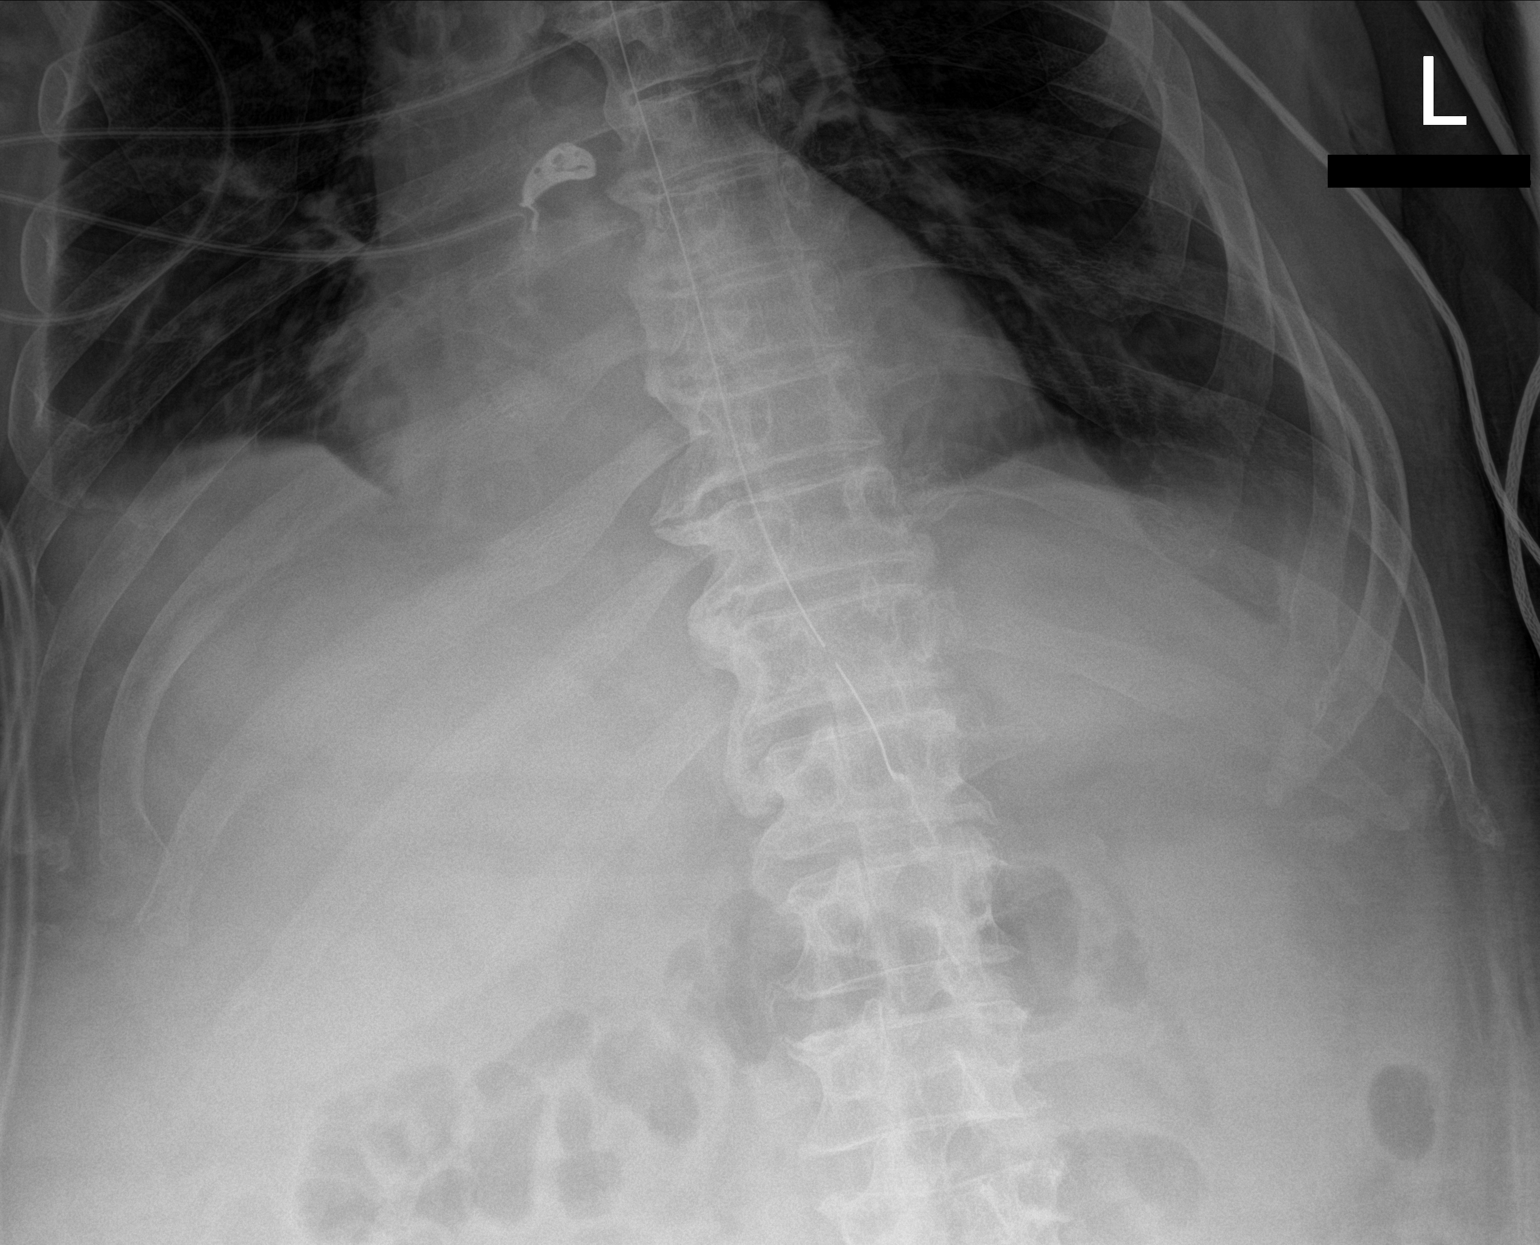

[1 of 1 positions shown; findings below may reference images not displayed]

FINDINGS: Partially visualized NG tube with tip and side-port just distal to
the GE junction. Recommend further advancing of the tube by
additional 7 cm.

Minimal bibasilar atelectasis.
IMPRESSION: Partially visualized NG tube with tip and side-port just distal to
the GE junction. Recommend further advancing of the tube.
